# Patient Record
Sex: Male | Born: 1945 | Race: White | Hispanic: No | Smoking: Former smoker
Health system: Southern US, Community
[De-identification: ages and names within clinical notes are randomized; demographics above are authoritative.]

## PROBLEM LIST (undated history)

## (undated) DIAGNOSIS — E785 Hyperlipidemia, unspecified: Secondary | ICD-10-CM

## (undated) DIAGNOSIS — F419 Anxiety disorder, unspecified: Secondary | ICD-10-CM

## (undated) DIAGNOSIS — N400 Enlarged prostate without lower urinary tract symptoms: Secondary | ICD-10-CM

## (undated) DIAGNOSIS — M109 Gout, unspecified: Secondary | ICD-10-CM

## (undated) DIAGNOSIS — Z978 Presence of other specified devices: Secondary | ICD-10-CM

## (undated) DIAGNOSIS — I10 Essential (primary) hypertension: Secondary | ICD-10-CM

## (undated) DIAGNOSIS — G4733 Obstructive sleep apnea (adult) (pediatric): Secondary | ICD-10-CM

## (undated) DIAGNOSIS — IMO0001 Reserved for inherently not codable concepts without codable children: Secondary | ICD-10-CM

## (undated) DIAGNOSIS — Z91018 Allergy to other foods: Secondary | ICD-10-CM

## (undated) DIAGNOSIS — R600 Localized edema: Secondary | ICD-10-CM

## (undated) DIAGNOSIS — R609 Edema, unspecified: Secondary | ICD-10-CM

## (undated) DIAGNOSIS — Z960 Presence of urogenital implants: Secondary | ICD-10-CM

## (undated) DIAGNOSIS — R339 Retention of urine, unspecified: Secondary | ICD-10-CM

## (undated) DIAGNOSIS — M549 Dorsalgia, unspecified: Secondary | ICD-10-CM

## (undated) DIAGNOSIS — E119 Type 2 diabetes mellitus without complications: Secondary | ICD-10-CM

## (undated) DIAGNOSIS — M2550 Pain in unspecified joint: Secondary | ICD-10-CM

## (undated) DIAGNOSIS — L409 Psoriasis, unspecified: Secondary | ICD-10-CM

## (undated) HISTORY — DX: Dorsalgia, unspecified: M54.9

## (undated) HISTORY — DX: Essential (primary) hypertension: I10

## (undated) HISTORY — DX: Type 2 diabetes mellitus without complications: E11.9

## (undated) HISTORY — DX: Edema, unspecified: R60.9

## (undated) HISTORY — DX: Localized edema: R60.0

## (undated) HISTORY — DX: Anxiety disorder, unspecified: F41.9

## (undated) HISTORY — DX: Gout, unspecified: M10.9

## (undated) HISTORY — DX: Pain in unspecified joint: M25.50

## (undated) HISTORY — PX: BACK SURGERY: SHX140

## (undated) HISTORY — DX: Obstructive sleep apnea (adult) (pediatric): G47.33

## (undated) HISTORY — DX: Allergy to other foods: Z91.018

## (undated) HISTORY — DX: Hyperlipidemia, unspecified: E78.5

---

## 2009-02-01 ENCOUNTER — Encounter: Admission: RE | Admit: 2009-02-01 | Discharge: 2009-03-29 | Payer: Self-pay | Admitting: Family Medicine

## 2010-11-02 ENCOUNTER — Encounter (HOSPITAL_BASED_OUTPATIENT_CLINIC_OR_DEPARTMENT_OTHER): Payer: Self-pay

## 2010-11-07 ENCOUNTER — Ambulatory Visit (HOSPITAL_BASED_OUTPATIENT_CLINIC_OR_DEPARTMENT_OTHER): Payer: Medicare Other | Attending: Family Medicine

## 2010-11-07 DIAGNOSIS — G4761 Periodic limb movement disorder: Secondary | ICD-10-CM | POA: Insufficient documentation

## 2010-11-07 DIAGNOSIS — G4710 Hypersomnia, unspecified: Secondary | ICD-10-CM | POA: Insufficient documentation

## 2010-11-07 DIAGNOSIS — G4730 Sleep apnea, unspecified: Secondary | ICD-10-CM | POA: Insufficient documentation

## 2010-11-10 DIAGNOSIS — G4710 Hypersomnia, unspecified: Secondary | ICD-10-CM

## 2010-11-10 DIAGNOSIS — G4761 Periodic limb movement disorder: Secondary | ICD-10-CM

## 2010-11-10 DIAGNOSIS — G4730 Sleep apnea, unspecified: Secondary | ICD-10-CM

## 2010-11-10 NOTE — Procedures (Signed)
NAME:  Benjamin Shepard, Benjamin Shepard          ACCOUNT NO.:  [id number]  MEDICAL RECORD NO.:  [id number]          PATIENT TYPE:  OUT  LOCATION:  SLEEP CENTER                 FACILITY:  [facility]  PHYSICIAN:  Roniya Tetro D. Maple Hudson, MD, FCCP, FACPDATE OF BIRTH:  03/14/1944  DATE OF STUDY:  11/07/2010                           NOCTURNAL POLYSOMNOGRAM  REFERRING PHYSICIAN:  Lillia Carmel, M.D.  INDICATIONS FOR STUDY:  Hypersomnia with sleep apnea.  EPWORTH SLEEPINESS SCORE:  4/24.  BMI 43.5.  Weight 330 pounds, height 73 inches.  Neck 20.5 inches.  HOME MEDICATIONS:  Charted and reviewed.  On the ordering instructions, a CPAP titration study was ordered with indication that the patient did had a diagnostic sleep study.  The patient indicated on his own form that he had never had a sleep study. This study was performed as ordered, as a therapeutic titration study.  SLEEP ARCHITECTURE:  Total sleep time 221 minutes with sleep efficiency 54%.  Stage I was 10.9%, stage II 55.2%, stage III 0.2%, REM 33.7% of total sleep time.  Sleep latency 44.5 minutes, REM latency 140 minutes, awake after sleep onset 144 minutes, arousal index 16.  BEDTIME MEDICATION:  Doxazosin.  RESPIRATORY DATA:  CPAP titration protocol.  CPAP was titrated to 10 CWP, AHI 0 per hour.  He wore a large ResMed Quattro FX full-face mask with heated humidifier.  OXYGEN DATA:  Snoring was prevented by CPAP and mean oxygen saturation held 93.7% on room air.  CARDIAC DATA:  Normal sinus rhythm.  MOVEMENTS/PARASOMNIA:  Frequent limb jerks.  A total of 283 limb jerks were counted of which 24 were associated with arousal or awakening for periodic limb movement with arousal index of 6.5 per hour.  No bathroom trips.  IMPRESSION/RECOMMENDATIONS: 1. This study was performed as ordered as a diagnostic CPAP titration     study.  Preexisting baseline NPSG documenting obstructive sleep     apnea is not available. 2. Successful CPAP titration  to 10 CWP, AHI 0 per hour.  He wore a     large ResMed Quattro FX full-face mask with heated humidifier.     Snoring was prevented and oxygenation maintained in normal range of     93.7% on room air. 3. Periodic limb movement with arousal.  A total of 283 limb jerks     were counted of which 24 were associated with arousal or awakening     for periodic limb movement with arousal index of 6.5 per hour.  If     limb movement is recognized as an important contributor to sleep     disturbance clinically then specific therapy such as Requip or     Mirapex might be considered.     Blue Ruggerio D. Maple Hudson, MD, [facility], FACP Diplomate, Biomedical engineer of Sleep Medicine Electronically Signed    CDY/MEDQ  D:  11/10/2010 09:42:41  T:  11/10/2010 09:56:05  Job:  [id number]

## 2010-11-29 ENCOUNTER — Ambulatory Visit (HOSPITAL_BASED_OUTPATIENT_CLINIC_OR_DEPARTMENT_OTHER): Payer: Medicare Other | Attending: Family Medicine

## 2010-12-01 NOTE — Procedures (Signed)
NAME:  Benjamin Shepard, Benjamin Shepard          ACCOUNT NO.:  [id number]  MEDICAL RECORD NO.:  [id number]          PATIENT TYPE:  OUT  LOCATION:  SLEEP CENTER                 FACILITY:  [facility]  PHYSICIAN:  Clinton D. Maple Hudson, MD, FCCP, FACPDATE OF BIRTH:  Jun 28, 1945  DATE OF STUDY:  11/29/2010                           NOCTURNAL POLYSOMNOGRAM  REFERRING PHYSICIAN:  Lillia Carmel, M.D.  REFERRING PHYSICIAN:  Lillia Carmel, MD.  INDICATION FOR STUDY:  Hypersomnia with sleep apnea.  EPWORTH SLEEPINESS SCORE:  4/24.  BMI 43.5.  Weight 330 pounds.  Height 73 inches.  Neck 20.5 inches.  MEDICATIONS:  Home medications are charted and reviewed.  A diagnostic CPAP titration study on November 07, 2010 had documented good control at a CPAP pressure of 10 with AHI zero per hour.  Diagnostics NPSG is requested.  SLEEP ARCHITECTURE:  Total sleep time 260.5 minutes with sleep efficiency 60.2%.  Stage I was 23.4%, stage II 63.3%, stage III was absent.  REM 13.2% of total sleep time.  Sleep latency 64 minutes.  REM latency 333.5 minutes.  Awake after sleep onset 107.5 minutes.  Arousal index 55.3.  BEDTIME MEDICATION:  Doxazosin.  RESPIRATORY DATA:  Apnea/hypopnea index (AHI) 73.7 per hour.  A total of 320 events was scored including 43 obstructive apneas, 4 central apneas, 1 mixed apnea, 272 hypopneas.  All events were associated with non- supine sleep position and REM.  REM AHI 76.5 per hour.  Sleep was severely fragmented by respiratory events, preventing application of split protocol CPAP titration on the study night.  OXYGEN DATA:  Moderate-to-loud snoring with oxygen desaturation to a nadir of 79% and mean oxygen saturation through the study is 92.9% on room air.  CARDIAC DATA:  Sinus rhythm with PACs.  MOVEMENT-PARASOMNIA:  A few limb jerks were noted with insignificant effect on sleep.  No bathroom trips.  IMPRESSIONS-RECOMMENDATIONS: 1. Severe obstructive sleep apnea/hypopnea syndrome,  AHI 73.7 per hour     with non-supine events.  Moderate-to-loud snoring with oxygen     desaturation to a nadir of 79% and a mean oxygen saturation through     the study of 92.9% on room air. 2. Successful CPAP titration to 10 CWP, AHI zero per hour was noted on     November 07, 2010.  He had worn a large ResMed Quattro FX full-face     mask with heated humidifier.  Snoring was improved and oxygenation     normalized.     Clinton D. Maple Hudson, MD, [facility], FACP Diplomate, Biomedical engineer of Sleep Medicine Electronically Signed    CDY/MEDQ  D:  12/01/2010 10:56:05  T:  12/01/2010 11:08:37  Job:  [id number]

## 2011-06-05 DIAGNOSIS — I059 Rheumatic mitral valve disease, unspecified: Secondary | ICD-10-CM | POA: Diagnosis not present

## 2011-06-05 DIAGNOSIS — I77819 Aortic ectasia, unspecified site: Secondary | ICD-10-CM | POA: Diagnosis not present

## 2011-07-31 DIAGNOSIS — I119 Hypertensive heart disease without heart failure: Secondary | ICD-10-CM | POA: Diagnosis not present

## 2011-07-31 DIAGNOSIS — G4733 Obstructive sleep apnea (adult) (pediatric): Secondary | ICD-10-CM | POA: Diagnosis not present

## 2011-07-31 DIAGNOSIS — R609 Edema, unspecified: Secondary | ICD-10-CM | POA: Diagnosis not present

## 2011-10-15 DIAGNOSIS — Z0000 Encounter for general adult medical examination without abnormal findings: Secondary | ICD-10-CM | POA: Diagnosis not present

## 2011-10-15 DIAGNOSIS — E119 Type 2 diabetes mellitus without complications: Secondary | ICD-10-CM | POA: Diagnosis not present

## 2011-10-15 DIAGNOSIS — H1030 Unspecified acute conjunctivitis, unspecified eye: Secondary | ICD-10-CM | POA: Diagnosis not present

## 2011-10-15 DIAGNOSIS — H60509 Unspecified acute noninfective otitis externa, unspecified ear: Secondary | ICD-10-CM | POA: Diagnosis not present

## 2011-10-15 DIAGNOSIS — E079 Disorder of thyroid, unspecified: Secondary | ICD-10-CM | POA: Diagnosis not present

## 2012-03-11 DIAGNOSIS — Z23 Encounter for immunization: Secondary | ICD-10-CM | POA: Diagnosis not present

## 2012-08-11 DIAGNOSIS — H00029 Hordeolum internum unspecified eye, unspecified eyelid: Secondary | ICD-10-CM | POA: Diagnosis not present

## 2012-08-11 DIAGNOSIS — E119 Type 2 diabetes mellitus without complications: Secondary | ICD-10-CM | POA: Diagnosis not present

## 2012-08-31 DIAGNOSIS — G4733 Obstructive sleep apnea (adult) (pediatric): Secondary | ICD-10-CM | POA: Diagnosis not present

## 2012-08-31 DIAGNOSIS — R609 Edema, unspecified: Secondary | ICD-10-CM | POA: Diagnosis not present

## 2012-08-31 DIAGNOSIS — I119 Hypertensive heart disease without heart failure: Secondary | ICD-10-CM | POA: Diagnosis not present

## 2012-10-12 DIAGNOSIS — I119 Hypertensive heart disease without heart failure: Secondary | ICD-10-CM | POA: Diagnosis not present

## 2012-10-12 DIAGNOSIS — E119 Type 2 diabetes mellitus without complications: Secondary | ICD-10-CM | POA: Diagnosis not present

## 2012-10-12 DIAGNOSIS — G4733 Obstructive sleep apnea (adult) (pediatric): Secondary | ICD-10-CM | POA: Diagnosis not present

## 2013-02-04 DIAGNOSIS — I10 Essential (primary) hypertension: Secondary | ICD-10-CM | POA: Diagnosis not present

## 2013-02-04 DIAGNOSIS — H269 Unspecified cataract: Secondary | ICD-10-CM | POA: Diagnosis not present

## 2013-02-04 DIAGNOSIS — Z6841 Body Mass Index (BMI) 40.0 and over, adult: Secondary | ICD-10-CM | POA: Diagnosis not present

## 2013-02-04 DIAGNOSIS — E119 Type 2 diabetes mellitus without complications: Secondary | ICD-10-CM | POA: Diagnosis not present

## 2013-02-04 DIAGNOSIS — Z23 Encounter for immunization: Secondary | ICD-10-CM | POA: Diagnosis not present

## 2013-02-04 DIAGNOSIS — G4733 Obstructive sleep apnea (adult) (pediatric): Secondary | ICD-10-CM | POA: Diagnosis not present

## 2013-02-04 DIAGNOSIS — M109 Gout, unspecified: Secondary | ICD-10-CM | POA: Diagnosis not present

## 2013-02-04 DIAGNOSIS — Z1331 Encounter for screening for depression: Secondary | ICD-10-CM | POA: Diagnosis not present

## 2013-02-09 DIAGNOSIS — H2510 Age-related nuclear cataract, unspecified eye: Secondary | ICD-10-CM | POA: Diagnosis not present

## 2013-02-09 DIAGNOSIS — H18419 Arcus senilis, unspecified eye: Secondary | ICD-10-CM | POA: Diagnosis not present

## 2013-02-09 DIAGNOSIS — H04129 Dry eye syndrome of unspecified lacrimal gland: Secondary | ICD-10-CM | POA: Diagnosis not present

## 2013-02-09 DIAGNOSIS — H35379 Puckering of macula, unspecified eye: Secondary | ICD-10-CM | POA: Diagnosis not present

## 2013-03-13 ENCOUNTER — Encounter: Payer: Self-pay | Admitting: *Deleted

## 2013-03-13 ENCOUNTER — Encounter: Payer: Self-pay | Admitting: Interventional Cardiology

## 2013-03-13 DIAGNOSIS — E119 Type 2 diabetes mellitus without complications: Secondary | ICD-10-CM | POA: Insufficient documentation

## 2013-03-13 DIAGNOSIS — G4733 Obstructive sleep apnea (adult) (pediatric): Secondary | ICD-10-CM | POA: Insufficient documentation

## 2013-03-13 DIAGNOSIS — M109 Gout, unspecified: Secondary | ICD-10-CM | POA: Insufficient documentation

## 2013-03-13 DIAGNOSIS — R609 Edema, unspecified: Secondary | ICD-10-CM | POA: Insufficient documentation

## 2013-03-13 DIAGNOSIS — I10 Essential (primary) hypertension: Secondary | ICD-10-CM | POA: Insufficient documentation

## 2013-03-15 ENCOUNTER — Ambulatory Visit (INDEPENDENT_AMBULATORY_CARE_PROVIDER_SITE_OTHER): Payer: Medicare Other | Admitting: Interventional Cardiology

## 2013-03-15 ENCOUNTER — Encounter: Payer: Self-pay | Admitting: Interventional Cardiology

## 2013-03-15 VITALS — BP 180/70 | HR 84 | Ht 73.0 in | Wt 330.0 lb

## 2013-03-15 DIAGNOSIS — G4733 Obstructive sleep apnea (adult) (pediatric): Secondary | ICD-10-CM

## 2013-03-15 DIAGNOSIS — E119 Type 2 diabetes mellitus without complications: Secondary | ICD-10-CM

## 2013-03-15 DIAGNOSIS — I10 Essential (primary) hypertension: Secondary | ICD-10-CM

## 2013-03-15 MED ORDER — SPIRONOLACTONE 25 MG PO TABS: 25.0000 mg | ORAL_TABLET | Freq: Every day | ORAL | Status: DC

## 2013-03-15 NOTE — Patient Instructions (Signed)
Start Spironolactone 25mg  daily. An Rx has been sent to your pharmacy  Continue taking all other medications as prescribed.  Your physician recommends that you return for lab work in: 7-10 days  Your physician recommends that you schedule a follow-up appointment in: 2-3 months

## 2013-03-15 NOTE — Progress Notes (Signed)
Patient ID: Benjamin Shepard, male   DOB: 04/11/1945, 67 y.o.   MRN: [id number]    [address], [address] [place], [place]  [address] Phone: [phone] Fax:  [phone]  Date:  03/15/2013   ID:  Benjamin Shepard, DOB [birth year], MRN [id number]  PCP:  No primary provider on file.   ASSESSMENT: Hypertension followup  1. Poor control of hypertension based upon today's data 2. Obesity 3. Sleep apnea  PLAN:  1. Add Aldactone 25 mg daily 2. Bmet 7-10 days   SUBJECTIVE: Benjamin Shepard is a 67 y.o. male who feels well. He is back today for followup after identifying poorly controlled blood pressure several months ago. Medication adjustments were made at that time. He denies dyspnea, orthopnea, syncope, and other complaints.   Wt Readings from Last 3 Encounters:  03/15/13 330 lb (149.687 kg)     Past Medical History  Diagnosis Date  . HTN (hypertension)   . Edema   . OSA (obstructive sleep apnea)   . Diabetes   . Gout     Current Outpatient Prescriptions  Medication Sig Dispense Refill  . allopurinol (ZYLOPRIM) 100 MG tablet Take 100 mg by mouth daily.      . colchicine 0.6 MG tablet Take 0.6 mg by mouth as needed.      . diltiazem (TIAZAC) 360 MG 24 hr capsule Take 360 mg by mouth daily.      Marland Kitchen doxazosin (CARDURA) 8 MG tablet Take 8 mg by mouth daily.      Marland Kitchen losartan-hydrochlorothiazide (HYZAAR) 100-12.5 MG per tablet Take 1 tablet by mouth daily.      . metFORMIN (GLUCOPHAGE) 500 MG tablet Take 500 mg by mouth 2 (two) times daily with a meal.       No current facility-administered medications for this visit.    Allergies:    Allergies  Allergen Reactions  . Other     Muscle relaxer    Social History:  The patient     ROS:  Please see the history of present illness.   All other systems reviewed and negative.   OBJECTIVE: VS:  BP 180/70  Pulse 84  Ht 6\' 1"  (1.854 m)  Wt 330 lb (149.687 kg)  BMI 43.55 kg/m2 Well nourished, well developed, in no  acute distress, healthy but obese HEENT: normal Neck: JVD flat. Carotid bruit absent  Cardiac:  normal S1, S2; RRR; no murmur Lungs:  clear to auscultation bilaterally, no wheezing, rhonchi or rales Abd: soft, nontender, no hepatomegaly Ext: Edema absent. Pulses 2+ Skin: warm and dry Neuro:  CNs 2-12 intact, no focal abnormalities noted  EKG:  Normal sinus rhythm, right bundle branch block, old inferior infarct.       Signed, Darci Needle III, MD 03/15/2013 11:37 AM  Past Medical History  Hypertension   Diabetes mellitus   Gout   Obesity

## 2013-03-17 ENCOUNTER — Telehealth: Payer: Self-pay | Admitting: Interventional Cardiology

## 2013-03-17 NOTE — Telephone Encounter (Signed)
New problem    Pt has question concerning Spironolactone that was prescribe to him. Please call.

## 2013-03-17 NOTE — Telephone Encounter (Signed)
returned pt call.adv pt that he is scheduled to have bmet lab drawn and his potassium would be checked with that lab.Dr.Smith would determine at that time if pt needs add K+ supplement. pt verbalized understanding.

## 2013-03-22 ENCOUNTER — Other Ambulatory Visit (INDEPENDENT_AMBULATORY_CARE_PROVIDER_SITE_OTHER): Payer: Medicare Other

## 2013-03-22 DIAGNOSIS — I10 Essential (primary) hypertension: Secondary | ICD-10-CM | POA: Diagnosis not present

## 2013-03-22 LAB — BASIC METABOLIC PANEL
CO2: 29 mEq/L (ref 19–32)
Chloride: 101 mEq/L (ref 96–112)
Glucose, Bld: 130 mg/dL — ABNORMAL HIGH (ref 70–99)
Potassium: 3.9 mEq/L (ref 3.5–5.1)
Sodium: 137 mEq/L (ref 135–145)

## 2013-03-23 ENCOUNTER — Telehealth: Payer: Self-pay

## 2013-03-23 NOTE — Telephone Encounter (Signed)
pt given lab results.Laboratory tests are normal with the exception of blood sugar 130.pt verbalized understanding.pt rquest a copy be forwarded to his pcp Dr.Holwerda.

## 2013-03-23 NOTE — Telephone Encounter (Signed)
Message copied by Jarvis Newcomer on Tue Mar 23, 2013  1:41 PM ------      Message from: Verdis Prime      Created: Mon Mar 22, 2013  6:01 PM       Laboratory tests are normal with the exception of blood sugar 1:30 ------

## 2013-04-29 ENCOUNTER — Other Ambulatory Visit: Payer: Self-pay | Admitting: Interventional Cardiology

## 2013-05-07 DIAGNOSIS — M109 Gout, unspecified: Secondary | ICD-10-CM | POA: Diagnosis not present

## 2013-05-07 DIAGNOSIS — E119 Type 2 diabetes mellitus without complications: Secondary | ICD-10-CM | POA: Diagnosis not present

## 2013-05-07 DIAGNOSIS — Z125 Encounter for screening for malignant neoplasm of prostate: Secondary | ICD-10-CM | POA: Diagnosis not present

## 2013-05-07 DIAGNOSIS — I10 Essential (primary) hypertension: Secondary | ICD-10-CM | POA: Diagnosis not present

## 2013-05-26 ENCOUNTER — Encounter: Payer: Self-pay | Admitting: Internal Medicine

## 2013-05-26 DIAGNOSIS — E1169 Type 2 diabetes mellitus with other specified complication: Secondary | ICD-10-CM | POA: Diagnosis not present

## 2013-05-26 DIAGNOSIS — G4733 Obstructive sleep apnea (adult) (pediatric): Secondary | ICD-10-CM | POA: Diagnosis not present

## 2013-05-26 DIAGNOSIS — I10 Essential (primary) hypertension: Secondary | ICD-10-CM | POA: Diagnosis not present

## 2013-05-26 DIAGNOSIS — Z0000 Encounter for general adult medical examination without abnormal findings: Secondary | ICD-10-CM | POA: Diagnosis not present

## 2013-05-26 DIAGNOSIS — M109 Gout, unspecified: Secondary | ICD-10-CM | POA: Diagnosis not present

## 2013-05-26 DIAGNOSIS — E785 Hyperlipidemia, unspecified: Secondary | ICD-10-CM | POA: Insufficient documentation

## 2013-05-26 DIAGNOSIS — Z6841 Body Mass Index (BMI) 40.0 and over, adult: Secondary | ICD-10-CM | POA: Diagnosis not present

## 2013-06-08 ENCOUNTER — Encounter: Payer: Self-pay | Admitting: Internal Medicine

## 2013-06-17 ENCOUNTER — Other Ambulatory Visit: Payer: Self-pay | Admitting: Interventional Cardiology

## 2013-06-22 ENCOUNTER — Ambulatory Visit (INDEPENDENT_AMBULATORY_CARE_PROVIDER_SITE_OTHER): Payer: Medicare Other | Admitting: Interventional Cardiology

## 2013-06-22 ENCOUNTER — Encounter: Payer: Self-pay | Admitting: Interventional Cardiology

## 2013-06-22 VITALS — BP 154/85 | HR 83 | Ht 73.0 in | Wt 323.0 lb

## 2013-06-22 DIAGNOSIS — G4733 Obstructive sleep apnea (adult) (pediatric): Secondary | ICD-10-CM | POA: Diagnosis not present

## 2013-06-22 DIAGNOSIS — I10 Essential (primary) hypertension: Secondary | ICD-10-CM

## 2013-06-22 DIAGNOSIS — E119 Type 2 diabetes mellitus without complications: Secondary | ICD-10-CM | POA: Diagnosis not present

## 2013-06-22 DIAGNOSIS — R609 Edema, unspecified: Secondary | ICD-10-CM

## 2013-06-22 NOTE — Patient Instructions (Signed)
Your physician has recommended you make the following change in your medication:  1) Start Aspirin 81mg  daily  Take all other medications as prescribed  Your physician discussed the importance of regular exercise and recommended that you start or continue a regular exercise program for good health.  Monitor your sodium intake  Your physician wants you to follow-up in: 1 year You will receive a reminder letter in the mail two months in advance. If you don't receive a letter, please call our office to schedule the follow-up appointment.

## 2013-06-22 NOTE — Progress Notes (Signed)
Patient ID: Benjamin Shepard, male   DOB: 1946/01/13, 68 y.o.   MRN: [id number]    [address], [address] [place], [place]  [address] Phone: [phone] Fax:  [phone]  Date:  06/22/2013   ID:  Benjamin Shepard, DOB 1945/04/08, MRN [id number]  PCP:  No primary provider on file.   ASSESSMENT:  1. Hypertension, still not optimally controlled 2. Obesity 3. Sleep apnea 4. Diabetes mellitus  PLAN:  1. Continue current medical regimen 2. If blood pressure continues to remain high, low-dose beta blocker therapy should be added 3. I encouraged aerobic activity and weight loss    SUBJECTIVE: Benjamin Shepard is a 68 y.o. male who has no complaints. There no medication side effects. He denies lightheadedness and syncope. No chest pain. He has lost 7 pounds since December 15 visit. Lower extremity edema has resolved Aldactone has been increased to 50 mg daily instead of 25.   Wt Readings from Last 3 Encounters:  06/22/13 323 lb (146.512 kg)  03/15/13 330 lb (149.687 kg)     Past Medical History  Diagnosis Date  . HTN (hypertension)   . Edema   . OSA (obstructive sleep apnea)   . Diabetes   . Gout     Current Outpatient Prescriptions  Medication Sig Dispense Refill  . allopurinol (ZYLOPRIM) 100 MG tablet Take 100 mg by mouth daily.      Marland Kitchen aspirin 81 MG tablet Take 81 mg by mouth daily.      Marland Kitchen atorvastatin (LIPITOR) 20 MG tablet Take 20 mg by mouth daily.      . colchicine 0.6 MG tablet Take 0.6 mg by mouth as needed.      . doxazosin (CARDURA) 8 MG tablet Take 8 mg by mouth daily.      Marland Kitchen losartan-hydrochlorothiazide (HYZAAR) 100-12.5 MG per tablet Take 1 tablet by mouth daily.      . metFORMIN (GLUCOPHAGE) 500 MG tablet Take 500 mg by mouth 2 (two) times daily with a meal.      . spironolactone (ALDACTONE) 25 MG tablet Take 1 tablet (25 mg total) by mouth daily.  30 tablet  11  . TAZTIA XT 360 MG 24 hr capsule TAKE ONE CAPSULE BY MOUTH DAILY  30 capsule  0   No  current facility-administered medications for this visit.    Allergies:    Allergies  Allergen Reactions  . Other     Muscle relaxer    Social History:  The patient  reports that he quit smoking about 15 years ago. His smoking use included Cigarettes. He smoked 0.00 packs per day. He does not have any smokeless tobacco history on file.   ROS:  Please see the history of present illness.   No blood in urine or stool. Not currently taking aspirin. No transient neurological complaints.   All other systems reviewed and negative.   OBJECTIVE: VS:  BP 154/85  Pulse 83  Ht 6\' 1"  (1.854 m)  Wt 323 lb (146.512 kg)  BMI 42.62 kg/m2 Well nourished, well developed, in no acute distress, obese HEENT: normal Neck: JVD flat. Carotid bruit absent  Cardiac:  normal S1, S2; RRR; no murmur Lungs:  clear to auscultation bilaterally, no wheezing, rhonchi or rales Abd: soft, nontender, no hepatomegaly Ext: Edema trace bilateral. Pulses absent Skin: warm and dry Neuro:  CNs 2-12 intact, no focal abnormalities noted  EKG:  Not repeated       Signed, Illene Labrador III, MD 06/22/2013  11:29 AM

## 2013-07-07 ENCOUNTER — Ambulatory Visit (AMBULATORY_SURGERY_CENTER): Payer: Self-pay | Admitting: *Deleted

## 2013-07-07 VITALS — Ht 72.0 in | Wt 324.6 lb

## 2013-07-07 DIAGNOSIS — Z1211 Encounter for screening for malignant neoplasm of colon: Secondary | ICD-10-CM

## 2013-07-07 MED ORDER — MOVIPREP 100 G PO SOLR: ORAL | Status: DC

## 2013-07-07 NOTE — Progress Notes (Signed)
No allergies to eggs or soy. No problems with anesthesia.  Pt given Emmi instructions for colonoscopy  

## 2013-07-13 ENCOUNTER — Other Ambulatory Visit: Payer: Self-pay | Admitting: Interventional Cardiology

## 2013-07-21 ENCOUNTER — Encounter: Payer: Medicare Other | Admitting: Internal Medicine

## 2013-08-10 ENCOUNTER — Encounter: Payer: Self-pay | Admitting: Internal Medicine

## 2013-08-10 ENCOUNTER — Ambulatory Visit (AMBULATORY_SURGERY_CENTER): Payer: Medicare Other | Admitting: Internal Medicine

## 2013-08-10 VITALS — BP 119/72 | HR 63 | Temp 98.5°F | Resp 22 | Ht 72.0 in | Wt 324.0 lb

## 2013-08-10 DIAGNOSIS — Z1211 Encounter for screening for malignant neoplasm of colon: Secondary | ICD-10-CM | POA: Diagnosis not present

## 2013-08-10 DIAGNOSIS — D126 Benign neoplasm of colon, unspecified: Secondary | ICD-10-CM

## 2013-08-10 DIAGNOSIS — G4733 Obstructive sleep apnea (adult) (pediatric): Secondary | ICD-10-CM | POA: Diagnosis not present

## 2013-08-10 DIAGNOSIS — M109 Gout, unspecified: Secondary | ICD-10-CM | POA: Diagnosis not present

## 2013-08-10 DIAGNOSIS — I10 Essential (primary) hypertension: Secondary | ICD-10-CM | POA: Diagnosis not present

## 2013-08-10 DIAGNOSIS — E119 Type 2 diabetes mellitus without complications: Secondary | ICD-10-CM | POA: Diagnosis not present

## 2013-08-10 MED ORDER — SODIUM CHLORIDE 0.9 % IV SOLN: 500.0000 mL | INTRAVENOUS | Status: DC

## 2013-08-10 NOTE — Progress Notes (Signed)
No complaints noted in the reocery room.  pER dR. pYRTLE HOLD ASA 81MG  TODAY, BUT RESUME TOMORROW.  i WENT OVER THIS WITH THE PT AND HIS WIFE. MAW

## 2013-08-10 NOTE — Patient Instructions (Signed)
YOU HAD AN ENDOSCOPIC PROCEDURE TODAY AT THE [facility]: Refer to the procedure report that was given to you for any specific questions about what was found during the examination.  If the procedure report does not answer your questions, please call your gastroenterologist to clarify.  If you requested that your care partner not be given the details of your procedure findings, then the procedure report has been included in a sealed envelope for you to review at your convenience later.  YOU SHOULD EXPECT: Some feelings of bloating in the abdomen. Passage of more gas than usual.  Walking can help get rid of the air that was put into your GI tract during the procedure and reduce the bloating. If you had a lower endoscopy (such as a colonoscopy or flexible sigmoidoscopy) you may notice spotting of blood in your stool or on the toilet paper. If you underwent a bowel prep for your procedure, then you may not have a normal bowel movement for a few days.  DIET: Your first meal following the procedure should be a light meal and then it is ok to progress to your normal diet.  A half-sandwich or bowl of soup is an example of a good first meal.  Heavy or fried foods are harder to digest and may make you feel nauseous or bloated.  Likewise meals heavy in dairy and vegetables can cause extra gas to form and this can also increase the bloating.  Drink plenty of fluids but you should avoid alcoholic beverages for 24 hours.  ACTIVITY: Your care partner should take you home directly after the procedure.  You should plan to take it easy, moving slowly for the rest of the day.  You can resume normal activity the day after the procedure however you should NOT DRIVE or use heavy machinery for 24 hours (because of the sedation medicines used during the test).    SYMPTOMS TO REPORT IMMEDIATELY: A gastroenterologist can be reached at any hour.  During normal business hours, 8:30 AM to 5:00 PM Monday through Friday,  call [phone].  After hours and on weekends, please call the GI answering service at [phone] who will take a message and have the physician on call contact you.   Following lower endoscopy (colonoscopy or flexible sigmoidoscopy):  Excessive amounts of blood in the stool  Significant tenderness or worsening of abdominal pains  Swelling of the abdomen that is new, acute  Fever of 100F or higher   FOLLOW UP: If any biopsies were taken you will be contacted by phone or by letter within the next 1-3 weeks.  Call your gastroenterologist if you have not heard about the biopsies in 3 weeks.  Our staff will call the home number listed on your records the next business day following your procedure to check on you and address any questions or concerns that you may have at that time regarding the information given to you following your procedure. This is a courtesy call and so if there is no answer at the home number and we have not heard from you through the emergency physician on call, we will assume that you have returned to your regular daily activities without incident.  SIGNATURES/CONFIDENTIALITY: You and/or your care partner have signed paperwork which will be entered into your electronic medical record.  These signatures attest to the fact that that the information above on your After Visit Summary has been reviewed and is understood.  Full responsibility of the confidentiality of   this discharge information lies with you and/or your care-partner.     Handouts were given to your care partner on polyps, diverticulosis and a high fiber diet with liberal fluid intake. You may resume your current medications today. Except hold NSAIDS for the next two weeks. Await biopsy results. Please call if any questions or concerns. Blood sugar was 141 in the recovery room.

## 2013-08-10 NOTE — Progress Notes (Signed)
Report to pacu rn, vss, bbs=clear 

## 2013-08-10 NOTE — Progress Notes (Signed)
Called to room to assist during endoscopic procedure.  Patient ID and intended procedure confirmed with present staff. Received instructions for my participation in the procedure from the performing physician.  

## 2013-08-10 NOTE — Op Note (Signed)
[place]  [address] [place], [address]   COLONOSCOPY PROCEDURE REPORT  PATIENT: Benjamin Shepard, Benjamin Shepard  MR#: [id number] BIRTHDATE: [birth year] , 67  yrs. old GENDER: Male ENDOSCOPIST: Jerene Bears, MD REFERRED TM:LYYTK Ardeth Perfect, M.D. PROCEDURE DATE:  08/10/2013 PROCEDURE:   Colonoscopy with snare polypectomy and Colonoscopy with cold biopsy polypectomy First Screening Colonoscopy - Avg.  risk and is 50 yrs.  old or older Yes.  Prior Negative Screening - Now for repeat screening. N/A  History of Adenoma - Now for follow-up colonoscopy & has been > or = to 3 yrs.  N/A  Polyps Removed Today? Yes. ASA CLASS:   Class III INDICATIONS:average risk screening and first colonoscopy. MEDICATIONS: MAC sedation, administered by CRNA and propofol (Diprivan) 300mg  IV  DESCRIPTION OF PROCEDURE:   After the risks benefits and alternatives of the procedure were thoroughly explained, informed consent was obtained.  A digital rectal exam revealed no rectal mass.   The LB [id number]  endoscope was introduced through the anus and advanced to the cecum, which was identified by both the appendix and ileocecal valve. No adverse events experienced. The quality of the prep was good, using MoviPrep  The instrument was then slowly withdrawn as the colon was fully examined.   COLON FINDINGS: Eight sessile polyps ranging between 3-31mm in size were found at the hepatic flexure (1), in the transverse colon (2), descending colon (3), and sigmoid colon (2).  Polypectomy was performed with cold forceps (5) and using cold snare (3).  All resections were complete and all polyp tissue was completely retrieved.   There was moderate diverticulosis noted in the descending colon and sigmoid colon with associated muscular hypertrophy.  Retroflexed views revealed internal/external hemorrhoids. The time to cecum=4 minutes 54 seconds.  Withdrawal time=20 minutes 54 seconds.  The scope was  withdrawn and the procedure completed.  COMPLICATIONS: There were no complications.    ENDOSCOPIC IMPRESSION: 1.   Eight sessile polyps ranging between 3-32mm in size were found at the hepatic flexure, in the transverse colon, descending colon, and sigmoid colon; Polypectomy was performed with cold forceps and using cold snare 2.   There was moderate diverticulosis noted in the descending colon and sigmoid colon  RECOMMENDATIONS: 1.  Avoid all NSAIDS for the next 2 weeks. 2.  Await pathology results 3.  High fiber diet 4.  Timing of repeat colonoscopy will be determined by pathology findings. 5.  You will receive a letter within 1-2 weeks with the results of your biopsy as well as final recommendations.  Please call my office if you have not received a letter after 3 weeks.   eSigned:  Jerene Bears, MD 08/10/2013 10:00 AM   cc: The Patient, Velna Hatchet, M.D   PATIENT NAME:  [name], [name] MR#: [id number]

## 2013-08-11 ENCOUNTER — Telehealth: Payer: Self-pay

## 2013-08-11 NOTE — Telephone Encounter (Signed)
  Follow up Call-  Call back number 08/10/2013  Post procedure Call Back phone  # [phone]  Permission to leave phone message Yes     Patient questions:  Do you have a fever, pain , or abdominal swelling? no Pain Score  0 *  Have you tolerated food without any problems? yes  Have you been able to return to your normal activities? yes  Do you have any questions about your discharge instructions: Diet   no Medications  no Follow up visit  no  Do you have questions or concerns about your Care? no  Actions: * If pain score is 4 or above: No action needed, pain <4.

## 2013-08-18 ENCOUNTER — Encounter: Payer: Self-pay | Admitting: Internal Medicine

## 2013-09-02 DIAGNOSIS — E785 Hyperlipidemia, unspecified: Secondary | ICD-10-CM | POA: Diagnosis not present

## 2013-09-02 DIAGNOSIS — I10 Essential (primary) hypertension: Secondary | ICD-10-CM | POA: Diagnosis not present

## 2013-09-02 DIAGNOSIS — E119 Type 2 diabetes mellitus without complications: Secondary | ICD-10-CM | POA: Diagnosis not present

## 2013-09-17 ENCOUNTER — Other Ambulatory Visit: Payer: Self-pay | Admitting: Interventional Cardiology

## 2013-11-03 DIAGNOSIS — E119 Type 2 diabetes mellitus without complications: Secondary | ICD-10-CM | POA: Diagnosis not present

## 2013-11-10 ENCOUNTER — Other Ambulatory Visit: Payer: Self-pay | Admitting: Interventional Cardiology

## 2013-12-08 DIAGNOSIS — I10 Essential (primary) hypertension: Secondary | ICD-10-CM | POA: Diagnosis not present

## 2013-12-08 DIAGNOSIS — Z6841 Body Mass Index (BMI) 40.0 and over, adult: Secondary | ICD-10-CM | POA: Diagnosis not present

## 2013-12-08 DIAGNOSIS — G4733 Obstructive sleep apnea (adult) (pediatric): Secondary | ICD-10-CM | POA: Diagnosis not present

## 2013-12-08 DIAGNOSIS — R5381 Other malaise: Secondary | ICD-10-CM | POA: Diagnosis not present

## 2013-12-08 DIAGNOSIS — E1169 Type 2 diabetes mellitus with other specified complication: Secondary | ICD-10-CM | POA: Diagnosis not present

## 2013-12-08 DIAGNOSIS — E291 Testicular hypofunction: Secondary | ICD-10-CM | POA: Diagnosis not present

## 2013-12-08 DIAGNOSIS — R5383 Other fatigue: Secondary | ICD-10-CM | POA: Diagnosis not present

## 2013-12-08 DIAGNOSIS — Z23 Encounter for immunization: Secondary | ICD-10-CM | POA: Diagnosis not present

## 2013-12-08 DIAGNOSIS — E785 Hyperlipidemia, unspecified: Secondary | ICD-10-CM | POA: Diagnosis not present

## 2013-12-09 ENCOUNTER — Other Ambulatory Visit: Payer: Self-pay | Admitting: Interventional Cardiology

## 2013-12-09 NOTE — Telephone Encounter (Signed)
Should this be one or two daily? Please advise. Thanks, MI

## 2014-02-08 DIAGNOSIS — Z23 Encounter for immunization: Secondary | ICD-10-CM | POA: Diagnosis not present

## 2014-03-08 DIAGNOSIS — R600 Localized edema: Secondary | ICD-10-CM | POA: Diagnosis not present

## 2014-03-08 DIAGNOSIS — Z79899 Other long term (current) drug therapy: Secondary | ICD-10-CM | POA: Diagnosis not present

## 2014-03-08 DIAGNOSIS — E785 Hyperlipidemia, unspecified: Secondary | ICD-10-CM | POA: Diagnosis not present

## 2014-03-08 DIAGNOSIS — E119 Type 2 diabetes mellitus without complications: Secondary | ICD-10-CM | POA: Diagnosis not present

## 2014-03-08 DIAGNOSIS — R0609 Other forms of dyspnea: Secondary | ICD-10-CM | POA: Diagnosis not present

## 2014-03-08 DIAGNOSIS — G4733 Obstructive sleep apnea (adult) (pediatric): Secondary | ICD-10-CM | POA: Diagnosis not present

## 2014-03-08 DIAGNOSIS — I10 Essential (primary) hypertension: Secondary | ICD-10-CM | POA: Diagnosis not present

## 2014-03-08 DIAGNOSIS — Z6841 Body Mass Index (BMI) 40.0 and over, adult: Secondary | ICD-10-CM | POA: Diagnosis not present

## 2014-03-10 DIAGNOSIS — E119 Type 2 diabetes mellitus without complications: Secondary | ICD-10-CM | POA: Diagnosis not present

## 2014-03-10 DIAGNOSIS — R0609 Other forms of dyspnea: Secondary | ICD-10-CM | POA: Diagnosis not present

## 2014-03-10 DIAGNOSIS — E785 Hyperlipidemia, unspecified: Secondary | ICD-10-CM | POA: Diagnosis not present

## 2014-03-10 DIAGNOSIS — R600 Localized edema: Secondary | ICD-10-CM | POA: Diagnosis not present

## 2014-03-10 DIAGNOSIS — I10 Essential (primary) hypertension: Secondary | ICD-10-CM | POA: Diagnosis not present

## 2014-06-01 DIAGNOSIS — Z125 Encounter for screening for malignant neoplasm of prostate: Secondary | ICD-10-CM | POA: Diagnosis not present

## 2014-06-01 DIAGNOSIS — M109 Gout, unspecified: Secondary | ICD-10-CM | POA: Diagnosis not present

## 2014-06-01 DIAGNOSIS — E119 Type 2 diabetes mellitus without complications: Secondary | ICD-10-CM | POA: Diagnosis not present

## 2014-06-01 DIAGNOSIS — E785 Hyperlipidemia, unspecified: Secondary | ICD-10-CM | POA: Diagnosis not present

## 2014-06-01 DIAGNOSIS — I10 Essential (primary) hypertension: Secondary | ICD-10-CM | POA: Diagnosis not present

## 2014-06-08 ENCOUNTER — Encounter: Payer: Self-pay | Admitting: Interventional Cardiology

## 2014-06-08 DIAGNOSIS — H269 Unspecified cataract: Secondary | ICD-10-CM | POA: Diagnosis not present

## 2014-06-08 DIAGNOSIS — G4733 Obstructive sleep apnea (adult) (pediatric): Secondary | ICD-10-CM | POA: Diagnosis not present

## 2014-06-08 DIAGNOSIS — E119 Type 2 diabetes mellitus without complications: Secondary | ICD-10-CM | POA: Diagnosis not present

## 2014-06-08 DIAGNOSIS — M109 Gout, unspecified: Secondary | ICD-10-CM | POA: Diagnosis not present

## 2014-06-08 DIAGNOSIS — Z6841 Body Mass Index (BMI) 40.0 and over, adult: Secondary | ICD-10-CM | POA: Diagnosis not present

## 2014-06-08 DIAGNOSIS — E785 Hyperlipidemia, unspecified: Secondary | ICD-10-CM | POA: Diagnosis not present

## 2014-06-08 DIAGNOSIS — R600 Localized edema: Secondary | ICD-10-CM | POA: Diagnosis not present

## 2014-06-08 DIAGNOSIS — I10 Essential (primary) hypertension: Secondary | ICD-10-CM | POA: Diagnosis not present

## 2014-06-08 DIAGNOSIS — Z0000 Encounter for general adult medical examination without abnormal findings: Secondary | ICD-10-CM | POA: Diagnosis not present

## 2014-06-08 DIAGNOSIS — Z125 Encounter for screening for malignant neoplasm of prostate: Secondary | ICD-10-CM | POA: Diagnosis not present

## 2014-06-08 DIAGNOSIS — Z1389 Encounter for screening for other disorder: Secondary | ICD-10-CM | POA: Diagnosis not present

## 2014-06-09 DIAGNOSIS — Z1212 Encounter for screening for malignant neoplasm of rectum: Secondary | ICD-10-CM | POA: Diagnosis not present

## 2014-07-01 ENCOUNTER — Other Ambulatory Visit: Payer: Self-pay | Admitting: Interventional Cardiology

## 2014-07-19 ENCOUNTER — Encounter: Payer: Self-pay | Admitting: Interventional Cardiology

## 2014-07-19 ENCOUNTER — Ambulatory Visit (INDEPENDENT_AMBULATORY_CARE_PROVIDER_SITE_OTHER): Payer: Medicare Other | Admitting: Interventional Cardiology

## 2014-07-19 VITALS — BP 140/60 | HR 73 | Ht 73.0 in | Wt 343.8 lb

## 2014-07-19 DIAGNOSIS — R609 Edema, unspecified: Secondary | ICD-10-CM

## 2014-07-19 DIAGNOSIS — G4733 Obstructive sleep apnea (adult) (pediatric): Secondary | ICD-10-CM

## 2014-07-19 DIAGNOSIS — I10 Essential (primary) hypertension: Secondary | ICD-10-CM

## 2014-07-19 NOTE — Progress Notes (Signed)
Cardiology Office Note   Date:  07/19/2014   ID:  Benjamin Shepard, DOB September 07, 1945, MRN [id number]  PCP:  Velna Hatchet, MD  Cardiologist:   [name] Grooms, MD   Chief Complaint  Patient presents with  . Hypertension      History of Present Illness: Benjamin Shepard is a 69 y.o. male who presents for lower extremity edema, hypertension, and obstructive sleep apnea.  The patient is doing well. He has noticed some lower extremity swelling. Dyspnea has been under control. He is using C Pap as prescribed. He brought laboratory data from his primary care physician. All numbers are at target.    Past Medical History  Diagnosis Date  . HTN (hypertension)   . Edema   . OSA (obstructive sleep apnea)   . Diabetes   . Gout   . Hyperlipidemia     Past Surgical History  Procedure Laterality Date  . No prior surgery       Current Outpatient Prescriptions  Medication Sig Dispense Refill  . allopurinol (ZYLOPRIM) 100 MG tablet Take 100 mg by mouth daily.    Marland Kitchen amLODipine (NORVASC) 5 MG tablet Take 5 mg by mouth daily.    Marland Kitchen aspirin 81 MG tablet Take 81 mg by mouth daily.    Marland Kitchen atorvastatin (LIPITOR) 20 MG tablet Take 20 mg by mouth daily.    . colchicine 0.6 MG tablet Take 0.6 mg by mouth as needed (gout).     Marland Kitchen doxazosin (CARDURA) 8 MG tablet TAKE 2 TABLETS BY MOUTH EVERY DAY 60 tablet 1  . furosemide (LASIX) 20 MG tablet Take 20 mg by mouth daily as needed (leg swelling).    Marland Kitchen losartan-hydrochlorothiazide (HYZAAR) 100-25 MG per tablet TAKE 1 TABLET BY MOUTH EVERY DAY 30 tablet 11  . metFORMIN (GLUCOPHAGE) 500 MG tablet Take 500 mg by mouth 2 (two) times daily with a meal.    . TAZTIA XT 360 MG 24 hr capsule TAKE 1 CAPSULE BY MOUTH EVERY DAY 30 capsule 1   No current facility-administered medications for this visit.    Allergies:   Other    Social History:  The patient  reports that he quit smoking about 16 years ago. His smoking use included Cigarettes. He has  never used smokeless tobacco. He reports that he drinks alcohol. He reports that he does not use illicit drugs.   Family History:  The patient's family history includes Heart Problems in his mother; Heart disease in his mother; Heart failure in his mother; Hypertension in his father. There is no history of Colon cancer.    ROS:  Please see the history of present illness.   Otherwise, review of systems are positive for globin A1c is 6.1. Bilateral lower extremity swelling. Sleeping relatively well..   All other systems are reviewed and negative.    PHYSICAL EXAM: VS:  BP 140/60 mmHg  Pulse 73  Ht 6\' 1"  (1.854 m)  Wt 343 lb 12.8 oz (155.947 kg)  BMI 45.37 kg/m2 , BMI Body mass index is 45.37 kg/(m^2). GEN: Well nourished, well developed, in no acute distress HEENT: normal Neck: no JVD, carotid bruits, or masses Cardiac: RRR; no murmurs, rubs, or gallops. 1-2+ bilateral lower extremity edema . Respiratory:  clear to auscultation bilaterally, normal work of breathing GI: soft, nontender, nondistended, + BS MS: no deformity or atrophy Skin: warm and dry, no rash Neuro:  Strength and sensation are intact Psych: euthymic mood, full affect   EKG:  EKG is  ordered today. The ekg ordered today demonstrates normal sinus rhythm, incomplete right bundle branch block, and nonspecific ST-T wave abnormality.   Recent Labs: No results found for requested labs within last 365 days.    Lipid Panel No results found for: CHOL, TRIG, HDL, CHOLHDL, VLDL, LDLCALC, LDLDIRECT    Wt Readings from Last 3 Encounters:  07/19/14 343 lb 12.8 oz (155.947 kg)  08/10/13 324 lb (146.965 kg)  07/07/13 324 lb 9.6 oz (147.238 kg)      Other studies Reviewed: Additional studies/ records that were reviewed today include: View laboratory data from Dr Ardeth Perfect. Review of the above records demonstrates: Globin A1c 6.1 total cholesterol 137 LDL cholesterol 79   ASSESSMENT AND PLAN:  Essential hypertension: The  blood pressure is controlled  OSA (obstructive sleep apnea): Continues to use sleep equipment  Lower Extremity Edema: Moderate bilateral lower extremity swelling. Recently Aldactone was discontinued and furosemide 20 mg per day started by     Current medicines are reviewed at length with the patient today.  The patient does not have concerns regarding medicines.  The following changes have been made:  no change  Labs/ tests ordered today include:  No orders of the defined types were placed in this encounter.     Disposition:   FU with Linard Millers in 1 year  Signed, [name] Grooms, MD  07/19/2014 4:26 PM    [facility] [address], [place], [place]  [address] Phone: [phone]; Fax: [phone]

## 2014-07-19 NOTE — Patient Instructions (Signed)

## 2014-09-10 ENCOUNTER — Other Ambulatory Visit: Payer: Self-pay | Admitting: Interventional Cardiology

## 2014-09-21 ENCOUNTER — Other Ambulatory Visit: Payer: Self-pay | Admitting: Interventional Cardiology

## 2014-09-22 ENCOUNTER — Other Ambulatory Visit: Payer: Self-pay

## 2014-09-22 MED ORDER — LOSARTAN POTASSIUM-HCTZ 100-25 MG PO TABS: 1.0000 | ORAL_TABLET | Freq: Every day | ORAL | Status: DC

## 2015-02-03 DIAGNOSIS — Z23 Encounter for immunization: Secondary | ICD-10-CM | POA: Diagnosis not present

## 2015-02-27 DIAGNOSIS — R3129 Other microscopic hematuria: Secondary | ICD-10-CM | POA: Diagnosis not present

## 2015-02-27 DIAGNOSIS — Z6841 Body Mass Index (BMI) 40.0 and over, adult: Secondary | ICD-10-CM | POA: Diagnosis not present

## 2015-02-27 DIAGNOSIS — R339 Retention of urine, unspecified: Secondary | ICD-10-CM | POA: Diagnosis not present

## 2015-02-27 DIAGNOSIS — I10 Essential (primary) hypertension: Secondary | ICD-10-CM | POA: Diagnosis not present

## 2015-02-27 DIAGNOSIS — E119 Type 2 diabetes mellitus without complications: Secondary | ICD-10-CM | POA: Diagnosis not present

## 2015-02-28 ENCOUNTER — Encounter (HOSPITAL_COMMUNITY): Payer: Self-pay | Admitting: Emergency Medicine

## 2015-02-28 ENCOUNTER — Emergency Department (HOSPITAL_COMMUNITY)
Admission: EM | Admit: 2015-02-28 | Discharge: 2015-02-28 | Disposition: A | Discharge status: Home / Self Care | Payer: Medicare Other | Attending: Emergency Medicine | Admitting: Emergency Medicine

## 2015-02-28 DIAGNOSIS — Z7984 Long term (current) use of oral hypoglycemic drugs: Secondary | ICD-10-CM | POA: Diagnosis not present

## 2015-02-28 DIAGNOSIS — R339 Retention of urine, unspecified: Secondary | ICD-10-CM

## 2015-02-28 DIAGNOSIS — Z8669 Personal history of other diseases of the nervous system and sense organs: Secondary | ICD-10-CM | POA: Diagnosis not present

## 2015-02-28 DIAGNOSIS — Z79899 Other long term (current) drug therapy: Secondary | ICD-10-CM | POA: Diagnosis not present

## 2015-02-28 DIAGNOSIS — M109 Gout, unspecified: Secondary | ICD-10-CM | POA: Diagnosis not present

## 2015-02-28 DIAGNOSIS — E119 Type 2 diabetes mellitus without complications: Secondary | ICD-10-CM | POA: Diagnosis not present

## 2015-02-28 DIAGNOSIS — Z7982 Long term (current) use of aspirin: Secondary | ICD-10-CM | POA: Diagnosis not present

## 2015-02-28 DIAGNOSIS — R34 Anuria and oliguria: Secondary | ICD-10-CM | POA: Insufficient documentation

## 2015-02-28 DIAGNOSIS — R39198 Other difficulties with micturition: Secondary | ICD-10-CM | POA: Insufficient documentation

## 2015-02-28 DIAGNOSIS — N210 Calculus in bladder: Secondary | ICD-10-CM | POA: Diagnosis not present

## 2015-02-28 DIAGNOSIS — N400 Enlarged prostate without lower urinary tract symptoms: Secondary | ICD-10-CM | POA: Diagnosis not present

## 2015-02-28 DIAGNOSIS — R1030 Lower abdominal pain, unspecified: Secondary | ICD-10-CM | POA: Diagnosis not present

## 2015-02-28 DIAGNOSIS — I10 Essential (primary) hypertension: Secondary | ICD-10-CM | POA: Insufficient documentation

## 2015-02-28 DIAGNOSIS — E785 Hyperlipidemia, unspecified: Secondary | ICD-10-CM | POA: Insufficient documentation

## 2015-02-28 DIAGNOSIS — Z87891 Personal history of nicotine dependence: Secondary | ICD-10-CM | POA: Insufficient documentation

## 2015-02-28 LAB — URINALYSIS, ROUTINE W REFLEX MICROSCOPIC
BILIRUBIN URINE: NEGATIVE
Glucose, UA: NEGATIVE mg/dL
Ketones, ur: NEGATIVE mg/dL
Leukocytes, UA: NEGATIVE
NITRITE: NEGATIVE
PH: 6 (ref 5.0–8.0)
Protein, ur: NEGATIVE mg/dL
SPECIFIC GRAVITY, URINE: 1.014 (ref 1.005–1.030)

## 2015-02-28 LAB — URINE MICROSCOPIC-ADD ON
BACTERIA UA: NONE SEEN
SQUAMOUS EPITHELIAL / LPF: NONE SEEN

## 2015-02-28 MED ORDER — TAMSULOSIN HCL 0.4 MG PO CAPS: 0.4000 mg | ORAL_CAPSULE | Freq: Every day | ORAL | Status: DC

## 2015-02-28 NOTE — ED Provider Notes (Signed)
  Face-to-face evaluation   History: he presents with decreased urinary output for 3 days, with similar symptoms in the past. He was at his PCP office today, and after urinalysis showed hematuria, he had a CT scan done. He was sent here "for a Foley".   Physical exam:Alert, obese man. He is comfortable. There is about a liter of clear yellow urine in his Foley catheter bag.  Medical screening examination/treatment/procedure(s) were conducted as a shared visit with non-physician practitioner(s) and myself.  I personally evaluated the patient during the encounter  Daleen Bo, MD 03/01/15 [phone]

## 2015-02-28 NOTE — ED Provider Notes (Signed)
CSN: [id number]     Arrival date & time 02/28/15  1657 History  By signing my name below, I, Meriel Pica, attest that this documentation has been prepared under the direction and in the presence of Guadalupe Kerekes, PA-C. Electronically Signed: Meriel Pica, ED Scribe. 02/28/2015. 6:07 PM.   Chief Complaint  Patient presents with  . Urinary Retention   The history is provided by the patient. No language interpreter was used.   HPI Comments: Benjamin Shepard is a 69 y.o. male who presents to the Emergency Department complaining of the gradually worsening, constant feeling of urinary retention with decreased urine volume X 3 days. Pt states he has been visiting the bathroom increasingly more frequent but has only been dribbling urine since 3 days ago. He had a CT scan of bladder performed at [facility] today that demonstrate an enlarged prostate (8.4 cm) with 3 stones in left side of bladder. An NP at his PCP with [place] prescribed ciprofloxacin and advised the pt to drink plenty of fluids and present to the ED for foley and Urology referral. Foley catheter placed in triage with relief of 928ml of urine.He associates pain to suprapubic abdomen with relief after foley catheter placement. He has no other complaints today.   Past Medical History  Diagnosis Date  . HTN (hypertension)   . Edema   . OSA (obstructive sleep apnea)   . Diabetes ([place])   . Gout   . Hyperlipidemia    Past Surgical History  Procedure Laterality Date  . No prior surgery     Family History  Problem Relation Age of Onset  . Hypertension Father   . Heart Problems Mother   . Heart failure Mother   . Heart disease Mother   . Colon cancer Neg Hx    Social History  Substance Use Topics  . Smoking status: Former Smoker    Types: Cigarettes    Quit date: 03/15/1998  . Smokeless tobacco: Never Used  . Alcohol Use: Yes     Comment: rare wine, beer    Review of Systems  Constitutional:  Negative for fever and chills.  Respiratory: Negative for shortness of breath.   Cardiovascular: Negative for chest pain.  Gastrointestinal: Positive for abdominal pain. Negative for nausea and vomiting.  Genitourinary: Positive for decreased urine volume and difficulty urinating. Negative for dysuria, frequency, hematuria and flank pain.  All other systems reviewed and are negative.  Allergies  Other  Home Medications   Prior to Admission medications   Medication Sig Start Date End Date Taking? Authorizing Provider  allopurinol (ZYLOPRIM) 100 MG tablet Take 100 mg by mouth daily.    Historical Provider, MD  amLODipine (NORVASC) 5 MG tablet Take 5 mg by mouth daily.    Historical Provider, MD  aspirin 81 MG tablet Take 81 mg by mouth daily.    Historical Provider, MD  atorvastatin (LIPITOR) 20 MG tablet Take 20 mg by mouth daily.    Historical Provider, MD  colchicine 0.6 MG tablet Take 0.6 mg by mouth as needed (gout).     Historical Provider, MD  doxazosin (CARDURA) 8 MG tablet TAKE 2 TABLETS BY MOUTH EVERY DAY 09/12/14   Belva Crome, MD  furosemide (LASIX) 20 MG tablet Take 20 mg by mouth daily as needed (leg swelling).    Historical Provider, MD  losartan-hydrochlorothiazide (HYZAAR) 100-25 MG per tablet Take 1 tablet by mouth daily. 09/22/14   Sherren Mocha, MD  metFORMIN (GLUCOPHAGE) 500 MG tablet  Take 500 mg by mouth 2 (two) times daily with a meal.    Historical Provider, MD  tamsulosin (FLOMAX) 0.4 MG CAPS capsule Take 1 capsule (0.4 mg total) by mouth at bedtime. 02/28/15   Eduard Penkala, PA-C  TAZTIA XT 360 MG 24 hr capsule TAKE 1 CAPSULE BY MOUTH EVERY DAY 09/12/14   Belva Crome, MD   BP 138/58 mmHg  Pulse 80  Temp(Src) 97.4 F (36.3 C) (Oral)  Resp 32  Wt 319 lb 4 oz (144.811 kg)  SpO2 98% Physical Exam  Constitutional: He appears well-developed and well-nourished. No distress.  Patient is nontoxic-appearing.  HENT:  Head: Normocephalic and atraumatic.  Right Ear:  External ear normal.  Left Ear: External ear normal.  Eyes: Conjunctivae are normal. Right eye exhibits no discharge. Left eye exhibits no discharge. No scleral icterus.  Neck: Normal range of motion.  Cardiovascular: Normal rate.   Pulmonary/Chest: Effort normal.  Abdominal: Soft. Bowel sounds are normal. He exhibits no distension. There is no tenderness. There is no rebound and no guarding.  Genitourinary:  Foley catheter in place draining straw-colored urine  Musculoskeletal: Normal range of motion.  Moves all extremities spontaneously and walks with a steady gait  Neurological: He is alert. Coordination normal.  Skin: Skin is warm and dry.  Psychiatric: He has a normal mood and affect. His behavior is normal.  Nursing note and vitals reviewed.   ED Course  Procedures  DIAGNOSTIC STUDIES: Oxygen Saturation is 98% on RA, normal by my interpretation.    COORDINATION OF CARE: 6:04 PM Discussed treatment plan which includes to consult with Dr. Eulis Foster with pt. Pt acknowledges and agrees to plan.  6:42 PM Consulted with Dr. Eulis Foster. Will order urine culture, prescribe Flomax and give referral to [facility] follow up.   Labs Review Labs Reviewed  URINE CULTURE  URINALYSIS, ROUTINE W REFLEX MICROSCOPIC (NOT AT [facility])   I have personally reviewed and evaluated these lab results as part of my medical decision-making.   MDM   Final diagnoses:  Urinary retention   Patient presenting from primary care office for Foley placement and urology referral with a 3 day history of urinary retention. Patient was evaluated by PCP and sent for CT scan earlier this afternoon. CT positive for 8.4 cm prostate. Patient was then reevaluated in their primary care office and sent to ED. Foley catheter placed with 999 mL return. Patient reports full resolution of symptoms after Foley placement. Discussed case with Dr. Eulis Foster who also saw and evaluated the patient. We'll send urine for culture. Will start  patient on Flomax and give leg bag for catheter. Patient instructed on proper Foley care. Given referral for [facility] and encouraged to call first thing tomorrow morning for follow-up. She will continue Cipro as prescribed by PCP. Return precautions given in discharge paperwork and discussed with pt at bedside. Pt stable for discharge  I personally performed the services described in this documentation, which was scribed in my presence. The recorded information has been reviewed and is accurate.    Josephina Gip, PA-C 02/28/15 2007  Daleen Bo, MD 03/01/15 [phone]

## 2015-02-28 NOTE — Discharge Instructions (Signed)
Call [facility] tomorrow morning to schedule a follow-up appointment. Keep Foley in place until instructed otherwise by urology. Continue taking Cipro as directed by PCP. Take Flomax at night.   Acute Urinary Retention, Male Acute urinary retention is the temporary inability to urinate. This is a common problem in older men. As men age their prostates become larger and block the flow of urine from the bladder. This is usually a problem that has come on gradually.  HOME CARE INSTRUCTIONS If you are sent home with a Foley catheter and a drainage system, you will need to discuss the best course of action with your health care provider. While the catheter is in, maintain a good intake of fluids. Keep the drainage bag emptied and lower than your catheter. This is so that contaminated urine will not flow back into your bladder, which could lead to a urinary tract infection. There are two main types of drainage bags. One is a large bag that usually is used at night. It has a good capacity that will allow you to sleep through the night without having to empty it. The second type is called a leg bag. It has a smaller capacity, so it needs to be emptied more frequently. However, the main advantage is that it can be attached by a leg strap and can go underneath your clothing, allowing you the freedom to move about or leave your home. Only take over-the-counter or prescription medicines for pain, discomfort, or fever as directed by your health care provider.  SEEK MEDICAL CARE IF:  You develop a low-grade fever.  You experience spasms or leakage of urine with the spasms. SEEK IMMEDIATE MEDICAL CARE IF:   You develop chills or fever.  Your catheter stops draining urine.  Your catheter falls out.  You start to develop increased bleeding that does not respond to rest and increased fluid intake. MAKE SURE YOU:  Understand these instructions.  Will watch your condition.  Will get help right away  if you are not doing well or get worse.   This information is not intended to replace advice given to you by your health care provider. Make sure you discuss any questions you have with your health care provider.   Document Released: 06/24/2000 Document Revised: 08/02/2014 Document Reviewed: 08/27/2012 Elsevier Interactive Patient Education [employer].

## 2015-02-28 NOTE — ED Notes (Signed)
Pt presents with urinary retention pt states he has not be able to urinate normally since Saturday. Pt had CT scan today that showed enlarged prostate and 3 stones in left side of bladder. Pt states he just went to restroom but is just having dribbling.

## 2015-03-01 LAB — URINE CULTURE: CULTURE: NO GROWTH

## 2015-03-03 ENCOUNTER — Emergency Department (HOSPITAL_COMMUNITY)
Admission: EM | Admit: 2015-03-03 | Discharge: 2015-03-03 | Disposition: A | Discharge status: Home / Self Care | Payer: Medicare Other | Attending: Emergency Medicine | Admitting: Emergency Medicine

## 2015-03-03 ENCOUNTER — Encounter (HOSPITAL_COMMUNITY): Payer: Self-pay | Admitting: Emergency Medicine

## 2015-03-03 DIAGNOSIS — T839XXA Unspecified complication of genitourinary prosthetic device, implant and graft, initial encounter: Secondary | ICD-10-CM

## 2015-03-03 DIAGNOSIS — E785 Hyperlipidemia, unspecified: Secondary | ICD-10-CM | POA: Diagnosis not present

## 2015-03-03 DIAGNOSIS — E119 Type 2 diabetes mellitus without complications: Secondary | ICD-10-CM | POA: Insufficient documentation

## 2015-03-03 DIAGNOSIS — M109 Gout, unspecified: Secondary | ICD-10-CM | POA: Insufficient documentation

## 2015-03-03 DIAGNOSIS — E669 Obesity, unspecified: Secondary | ICD-10-CM | POA: Diagnosis not present

## 2015-03-03 DIAGNOSIS — T83098A Other mechanical complication of other indwelling urethral catheter, initial encounter: Secondary | ICD-10-CM | POA: Insufficient documentation

## 2015-03-03 DIAGNOSIS — Z792 Long term (current) use of antibiotics: Secondary | ICD-10-CM | POA: Diagnosis not present

## 2015-03-03 DIAGNOSIS — I10 Essential (primary) hypertension: Secondary | ICD-10-CM | POA: Insufficient documentation

## 2015-03-03 DIAGNOSIS — Z87891 Personal history of nicotine dependence: Secondary | ICD-10-CM | POA: Insufficient documentation

## 2015-03-03 DIAGNOSIS — Z8669 Personal history of other diseases of the nervous system and sense organs: Secondary | ICD-10-CM | POA: Insufficient documentation

## 2015-03-03 DIAGNOSIS — Z79899 Other long term (current) drug therapy: Secondary | ICD-10-CM | POA: Insufficient documentation

## 2015-03-03 DIAGNOSIS — Z7982 Long term (current) use of aspirin: Secondary | ICD-10-CM | POA: Insufficient documentation

## 2015-03-03 DIAGNOSIS — R339 Retention of urine, unspecified: Secondary | ICD-10-CM | POA: Diagnosis present

## 2015-03-03 DIAGNOSIS — Y658 Other specified misadventures during surgical and medical care: Secondary | ICD-10-CM | POA: Insufficient documentation

## 2015-03-03 DIAGNOSIS — R319 Hematuria, unspecified: Secondary | ICD-10-CM | POA: Diagnosis not present

## 2015-03-03 LAB — URINE MICROSCOPIC-ADD ON

## 2015-03-03 LAB — URINALYSIS, ROUTINE W REFLEX MICROSCOPIC
BILIRUBIN URINE: NEGATIVE
Glucose, UA: NEGATIVE mg/dL
KETONES UR: NEGATIVE mg/dL
Leukocytes, UA: NEGATIVE
NITRITE: NEGATIVE
Protein, ur: 30 mg/dL — AB
SPECIFIC GRAVITY, URINE: 1.023 (ref 1.005–1.030)
pH: 6.5 (ref 5.0–8.0)

## 2015-03-03 NOTE — ED Provider Notes (Signed)
CSN: [id number]     Arrival date & time 03/03/15  1000 History   First MD Initiated Contact with Patient 03/03/15 1027     Chief Complaint  Patient presents with  . Urinary Retention  . Hematuria     (Consider location/radiation/quality/duration/timing/severity/associated sxs/prior Treatment) HPI Comments: Patient is a 69 year old male with history of hypertension, sleep apnea, diabetes, and obesity. He presents for evaluation of a plugged Foley catheter. He was seen here 3 days ago for urinary retention and a Foley catheter was placed. He was told that he had an enlarged prostate by his CT scan and is to follow-up with urology. He presents today stating that he is having lower abdominal pressure and has not had urine output in his catheter since last night. There is a slight amount of bloody urine present in the Foley bag.  Patient is a 69 y.o. male presenting with hematuria. The history is provided by the patient.  Hematuria This is a new problem. The current episode started 2 days ago. The problem occurs constantly. The problem has been gradually worsening. Nothing aggravates the symptoms. Nothing relieves the symptoms. He has tried nothing for the symptoms. The treatment provided no relief.    Past Medical History  Diagnosis Date  . HTN (hypertension)   . Edema   . OSA (obstructive sleep apnea)   . Diabetes ([place])   . Gout   . Hyperlipidemia    Past Surgical History  Procedure Laterality Date  . No prior surgery     Family History  Problem Relation Age of Onset  . Hypertension Father   . Heart Problems Mother   . Heart failure Mother   . Heart disease Mother   . Colon cancer Neg Hx    Social History  Substance Use Topics  . Smoking status: Former Smoker    Types: Cigarettes    Quit date: 03/15/1998  . Smokeless tobacco: Never Used  . Alcohol Use: Yes     Comment: rare wine, beer    Review of Systems  Genitourinary: Positive for hematuria.  All other systems  reviewed and are negative.     Allergies  Other  Home Medications   Prior to Admission medications   Medication Sig Start Date End Date Taking? Authorizing Provider  aspirin 81 MG tablet Take 81 mg by mouth daily.   Yes Historical Provider, MD  ciprofloxacin (CIPRO) 500 MG tablet Take 500 mg by mouth 2 (two) times daily.   Yes Historical Provider, MD  doxazosin (CARDURA) 8 MG tablet TAKE 2 TABLETS BY MOUTH EVERY DAY 09/12/14  Yes Belva Crome, MD  metFORMIN (GLUCOPHAGE) 500 MG tablet Take 500 mg by mouth 2 (two) times daily with a meal.   Yes Historical Provider, MD  allopurinol (ZYLOPRIM) 100 MG tablet Take 100 mg by mouth daily.    Historical Provider, MD  amLODipine (NORVASC) 5 MG tablet Take 5 mg by mouth daily.    Historical Provider, MD  atorvastatin (LIPITOR) 20 MG tablet Take 20 mg by mouth daily.    Historical Provider, MD  colchicine 0.6 MG tablet Take 0.6 mg by mouth as needed (gout).     Historical Provider, MD  furosemide (LASIX) 20 MG tablet Take 20 mg by mouth daily as needed (leg swelling).    Historical Provider, MD  losartan-hydrochlorothiazide (HYZAAR) 100-25 MG per tablet Take 1 tablet by mouth daily. 09/22/14   Sherren Mocha, MD  tamsulosin (FLOMAX) 0.4 MG CAPS capsule Take 1 capsule (0.4 mg  total) by mouth at bedtime. 02/28/15   Stevi Barrett, PA-C  TAZTIA XT 360 MG 24 hr capsule TAKE 1 CAPSULE BY MOUTH EVERY DAY 09/12/14   Belva Crome, MD   BP 136/71 mmHg  Pulse 79  Temp(Src) 99.2 F (37.3 C) (Oral)  Resp 20  Ht 6\' 1"  (1.854 m)  Wt 319 lb 4 oz (144.811 kg)  BMI 42.13 kg/m2  SpO2 98% Physical Exam  Constitutional: He is oriented to person, place, and time. He appears well-developed and well-nourished. No distress.  HENT:  Head: Normocephalic and atraumatic.  Neck: Normal range of motion. Neck supple.  Cardiovascular: Normal rate, regular rhythm and normal heart sounds.   No murmur heard. Pulmonary/Chest: Effort normal and breath sounds normal. No  respiratory distress. He has no wheezes. He has no rales.  Abdominal: Soft. Bowel sounds are normal. He exhibits no distension. There is tenderness.  Abdomen is obese. There is tenderness in the suprapubic region.  Musculoskeletal: Normal range of motion. He exhibits no edema.  Neurological: He is alert and oriented to person, place, and time.  Skin: Skin is warm and dry. He is not diaphoretic.  Nursing note and vitals reviewed.   ED Course  Procedures (including critical care time) Labs Review Labs Reviewed  URINALYSIS, ROUTINE W REFLEX MICROSCOPIC (NOT AT [facility])    Imaging Review No results found. I have personally reviewed and evaluated these images and lab results as part of my medical decision-making.   EKG Interpretation None      MDM   Final diagnoses:  None    Patient presents with a clogged Foley catheter. He was able to be flushed, however urine would not be pulled back. The catheter was removed and a new one was placed. 1400 mL of urine were obtained and the patient's symptoms resolved. He has an appointment with urology on Monday. He will be discharged and advised to follow-up with them as scheduled. His urine culture from 3 days ago was reviewed and is revealing no growth. He is currently on Cipro and is to continue this.    Veryl Speak, MD 03/03/15 1124

## 2015-03-03 NOTE — ED Notes (Signed)
Pt c/o urinary retention since last Sunday. Pt presents with foley in place. Reports decrease urine output onset last night. Pt currently taking flomax since Wednesday. Blood noted in foley bag.

## 2015-03-03 NOTE — Discharge Instructions (Signed)
Continue taking your Cipro as before.  Follow-up with urology on Monday as scheduled.

## 2015-03-06 DIAGNOSIS — R338 Other retention of urine: Secondary | ICD-10-CM | POA: Diagnosis not present

## 2015-03-06 DIAGNOSIS — N401 Enlarged prostate with lower urinary tract symptoms: Secondary | ICD-10-CM | POA: Diagnosis not present

## 2015-03-06 DIAGNOSIS — N210 Calculus in bladder: Secondary | ICD-10-CM | POA: Diagnosis not present

## 2015-03-06 DIAGNOSIS — R3129 Other microscopic hematuria: Secondary | ICD-10-CM | POA: Diagnosis not present

## 2015-03-06 DIAGNOSIS — R351 Nocturia: Secondary | ICD-10-CM | POA: Diagnosis not present

## 2015-03-08 DIAGNOSIS — R3129 Other microscopic hematuria: Secondary | ICD-10-CM | POA: Diagnosis not present

## 2015-03-08 DIAGNOSIS — R351 Nocturia: Secondary | ICD-10-CM | POA: Diagnosis not present

## 2015-03-08 DIAGNOSIS — N210 Calculus in bladder: Secondary | ICD-10-CM | POA: Diagnosis not present

## 2015-03-08 DIAGNOSIS — R338 Other retention of urine: Secondary | ICD-10-CM | POA: Diagnosis not present

## 2015-03-08 DIAGNOSIS — R39198 Other difficulties with micturition: Secondary | ICD-10-CM | POA: Diagnosis not present

## 2015-03-08 DIAGNOSIS — N401 Enlarged prostate with lower urinary tract symptoms: Secondary | ICD-10-CM | POA: Diagnosis not present

## 2015-03-13 DIAGNOSIS — R3129 Other microscopic hematuria: Secondary | ICD-10-CM | POA: Diagnosis not present

## 2015-03-13 DIAGNOSIS — N401 Enlarged prostate with lower urinary tract symptoms: Secondary | ICD-10-CM | POA: Diagnosis not present

## 2015-03-13 DIAGNOSIS — N210 Calculus in bladder: Secondary | ICD-10-CM | POA: Diagnosis not present

## 2015-03-13 DIAGNOSIS — R338 Other retention of urine: Secondary | ICD-10-CM | POA: Diagnosis not present

## 2015-03-27 ENCOUNTER — Emergency Department (HOSPITAL_COMMUNITY)
Admission: EM | Admit: 2015-03-27 | Discharge: 2015-03-28 | Disposition: A | Discharge status: Home / Self Care | Payer: Medicare Other | Attending: Emergency Medicine | Admitting: Emergency Medicine

## 2015-03-27 ENCOUNTER — Encounter (HOSPITAL_COMMUNITY): Payer: Self-pay | Admitting: Emergency Medicine

## 2015-03-27 DIAGNOSIS — Z87891 Personal history of nicotine dependence: Secondary | ICD-10-CM | POA: Diagnosis not present

## 2015-03-27 DIAGNOSIS — Z79899 Other long term (current) drug therapy: Secondary | ICD-10-CM | POA: Diagnosis not present

## 2015-03-27 DIAGNOSIS — Z7982 Long term (current) use of aspirin: Secondary | ICD-10-CM | POA: Diagnosis not present

## 2015-03-27 DIAGNOSIS — Z792 Long term (current) use of antibiotics: Secondary | ICD-10-CM | POA: Insufficient documentation

## 2015-03-27 DIAGNOSIS — I10 Essential (primary) hypertension: Secondary | ICD-10-CM | POA: Insufficient documentation

## 2015-03-27 DIAGNOSIS — R339 Retention of urine, unspecified: Secondary | ICD-10-CM | POA: Diagnosis present

## 2015-03-27 DIAGNOSIS — Z8669 Personal history of other diseases of the nervous system and sense organs: Secondary | ICD-10-CM | POA: Diagnosis not present

## 2015-03-27 DIAGNOSIS — E785 Hyperlipidemia, unspecified: Secondary | ICD-10-CM | POA: Diagnosis not present

## 2015-03-27 DIAGNOSIS — N390 Urinary tract infection, site not specified: Secondary | ICD-10-CM | POA: Diagnosis not present

## 2015-03-27 DIAGNOSIS — E119 Type 2 diabetes mellitus without complications: Secondary | ICD-10-CM | POA: Diagnosis not present

## 2015-03-27 DIAGNOSIS — M109 Gout, unspecified: Secondary | ICD-10-CM | POA: Insufficient documentation

## 2015-03-27 NOTE — ED Notes (Signed)
Patient here with complaint of blocked urinary catheter. States he has had the catheter in place for 3 weeks because of urinary retention secondary to an enlarged prostate. Reports several occurences of blocked catheters since having it placed. States that he has been feeling the urge to urinate and when going to the restroom notes the urine passing around his catheter.

## 2015-03-28 DIAGNOSIS — N390 Urinary tract infection, site not specified: Secondary | ICD-10-CM | POA: Diagnosis not present

## 2015-03-28 LAB — URINE MICROSCOPIC-ADD ON

## 2015-03-28 LAB — URINALYSIS, ROUTINE W REFLEX MICROSCOPIC
GLUCOSE, UA: NEGATIVE mg/dL
KETONES UR: 15 mg/dL — AB
Nitrite: POSITIVE — AB
PROTEIN: 100 mg/dL — AB
Specific Gravity, Urine: 1.025 (ref 1.005–1.030)
pH: 6 (ref 5.0–8.0)

## 2015-03-28 MED ORDER — ONDANSETRON 8 MG PO TBDP: 8.0000 mg | ORAL_TABLET | Freq: Three times a day (TID) | ORAL | Status: DC | PRN

## 2015-03-28 MED ORDER — CEPHALEXIN 500 MG PO CAPS: 500.0000 mg | ORAL_CAPSULE | Freq: Two times a day (BID) | ORAL | Status: DC

## 2015-03-28 MED ORDER — CEPHALEXIN 250 MG PO CAPS
500.0000 mg | ORAL_CAPSULE | Freq: Once | ORAL | Status: AC
  Administered 2015-03-28: 500 mg via ORAL
  Filled 2015-03-28: qty 2

## 2015-03-28 NOTE — Discharge Instructions (Signed)
Catheter-Associated Urinary Tract Infection FAQs  What is "catheter-associated urinary tract infection"?  A urinary tract infection (also called "UTI") is an infection in the urinary system, which includes the bladder (which stores the urine) and the kidneys (which filter the blood to make urine). Germs (for example, bacteria or yeasts) do not normally live in these areas; but if germs are introduced, an infection can occur.  If you have a urinary catheter, germs can travel along the catheter and cause an infection in your bladder or your kidney; in that case it is called a catheter-associated urinary tract infection (or "CA-UTI").   What is a urinary catheter?  A urinary catheter is a thin tube placed in the bladder to drain urine. Urine drains through the tube into a bag that collects the urine. A urinary catheter may be used:  · If you are not able to urinate on your own  · To measure the amount of urine that you make, for example, during intensive care  · During and after some types of surgery  · During some tests of the kidneys and bladder  People with urinary catheters have a much higher chance of getting a urinary tract infection than people who don't have a catheter.  How do I get a catheter-associated urinary tract infection (CA-UTI)?  If germs enter the urinary tract, they may cause an infection. Many of the germs that cause a catheter-associated urinary tract infection are common germs found in your intestines that do not usually cause an infection there. Germs can enter the urinary tract when the catheter is being put in or while the catheter remains in the bladder.   What are the symptoms of a urinary tract infection?  Some of the common symptoms of a urinary tract infection are:  · Burning or pain in the lower abdomen (that is, below the stomach)  · Fever  · Bloody urine may be a sign of infection, but is also caused by other problems  · Burning during urination or an increase in the frequency of  urination after the catheter is removed.  Sometimes people with catheter-associated urinary tract infections do not have these symptoms of infection.  Can catheter-associated urinary tract infections be treated?  Yes, most catheter-associated urinary tract infections can be treated with antibiotics and removal or change of the catheter. Your doctor will determine which antibiotic is best for you.   What are some of the things that hospitals are doing to prevent catheter-associated urinary tract infections?  To prevent urinary tract infections, doctors and nurses take the following actions.   Catheter insertion  · External catheters in men (these look like condoms and are placed over the penis rather than into the penis)  · Putting a temporary catheter in to drain the urine and removing it right away. This is called intermittent urethral catheterization.  Catheter care  What can I do to help prevent catheter-associated urinary tract infections if I have a catheter?  · Always clean your hands before and after doing catheter care.  · Always keep your urine bag below the level of your bladder.  · Do not tug or pull on the tubing.  · Do not twist or kink the catheter tubing.  · Ask your healthcare provider each day if you still need the catheter.  What do I need to do when I go home from the hospital?  · If you will be going home with a catheter, your doctor or nurse should explain everything   tract infection, such as burning or pain in the lower abdomen, fever, or an increase in the frequency of urination, contact your doctor or nurse immediately. °· Before you go home, make sure you know who to contact if you have questions or problems after you get home. °If you have questions, please ask your doctor or nurse. °Developed and co-sponsored by The  Society for Healthcare Epidemiology of America (SHEA); Infectious Diseases Society of America (IDSA); American Hospital Association; Association for Professionals in Infection Control and Epidemiology (APIC); Centers for Disease Control and Prevention (CDC); and The Joint Commission. °  °This information is not intended to replace advice given to you by your health care provider. Make sure you discuss any questions you have with your health care provider. °  °Document Released: 12/11/2011 Document Revised: 08/02/2014 Document Reviewed: 06/01/2014 °Elsevier Interactive Patient Education ©2016 Elsevier Inc. ° °Urinary Tract Infection °Urinary tract infections (UTIs) can develop anywhere along your urinary tract. Your urinary tract is your body's drainage system for removing wastes and extra water. Your urinary tract includes two kidneys, two ureters, a bladder, and a urethra. Your kidneys are a pair of bean-shaped organs. Each kidney is about the size of your fist. They are located below your ribs, one on each side of your spine. °CAUSES °Infections are caused by microbes, which are microscopic organisms, including fungi, viruses, and bacteria. These organisms are so small that they can only be seen through a microscope. Bacteria are the microbes that most commonly cause UTIs. °SYMPTOMS  °Symptoms of UTIs may vary by age and gender of the patient and by the location of the infection. Symptoms in young women typically include a frequent and intense urge to urinate and a painful, burning feeling in the bladder or urethra during urination. Older women and men are more likely to be tired, shaky, and weak and have muscle aches and abdominal pain. A fever may mean the infection is in your kidneys. Other symptoms of a kidney infection include pain in your back or sides below the ribs, nausea, and vomiting. °DIAGNOSIS °To diagnose a UTI, your caregiver will ask you about your symptoms. Your caregiver will also ask you to  provide a urine sample. The urine sample will be tested for bacteria and white blood cells. White blood cells are made by your body to help fight infection. °TREATMENT  °Typically, UTIs can be treated with medication. Because most UTIs are caused by a bacterial infection, they usually can be treated with the use of antibiotics. The choice of antibiotic and length of treatment depend on your symptoms and the type of bacteria causing your infection. °HOME CARE INSTRUCTIONS °· If you were prescribed antibiotics, take them exactly as your caregiver instructs you. Finish the medication even if you feel better after you have only taken some of the medication. °· Drink enough water and fluids to keep your urine clear or pale yellow. °· Avoid caffeine, tea, and carbonated beverages. They tend to irritate your bladder. °· Empty your bladder often. Avoid holding urine for long periods of time. °· Empty your bladder before and after sexual intercourse. °· After a bowel movement, women should cleanse from front to back. Use each tissue only once. °SEEK MEDICAL CARE IF:  °· You have back pain. °· You develop a fever. °· Your symptoms do not begin to resolve within 3 days. °SEEK IMMEDIATE MEDICAL CARE IF:  °· You have severe back pain or lower abdominal pain. °· You develop chills. °· You have   nausea or vomiting. °· You have continued burning or discomfort with urination. °MAKE SURE YOU:  °· Understand these instructions. °· Will watch your condition. °· Will get help right away if you are not doing well or get worse. °  °This information is not intended to replace advice given to you by your health care provider. Make sure you discuss any questions you have with your health care provider. °  °Document Released: 12/26/2004 Document Revised: 12/07/2014 Document Reviewed: 04/26/2011 °Elsevier Interactive Patient Education ©2016 Elsevier Inc. ° °

## 2015-03-28 NOTE — ED Provider Notes (Signed)
CSN: [id number]     Arrival date & time 03/27/15  1844 History   First MD Initiated Contact with Patient 03/27/15 2348     Chief Complaint  Patient presents with  . Urinary Retention     (Consider location/radiation/quality/duration/timing/severity/associated sxs/prior Treatment) HPI Comments: Pt comes in with cc of urinary urgency. Pt has hx of indwelling foley catheter for the past several days. Reports that starting today he has been having urinary urgency and he thinks the urine output has declined. Also reports that the urine is coming around the foley catheter. Pt has no back pain. No n/v/f/c.  The history is provided by the patient.    Past Medical History  Diagnosis Date  . HTN (hypertension)   . Edema   . OSA (obstructive sleep apnea)   . Diabetes ([place])   . Gout   . Hyperlipidemia    Past Surgical History  Procedure Laterality Date  . No prior surgery     Family History  Problem Relation Age of Onset  . Hypertension Father   . Heart Problems Mother   . Heart failure Mother   . Heart disease Mother   . Colon cancer Neg Hx    Social History  Substance Use Topics  . Smoking status: Former Smoker    Types: Cigarettes    Quit date: 03/15/1998  . Smokeless tobacco: Never Used  . Alcohol Use: Yes     Comment: rare wine, beer    Review of Systems  Constitutional: Negative for activity change and appetite change.  Respiratory: Negative for cough and shortness of breath.   Cardiovascular: Negative for chest pain.  Gastrointestinal: Positive for abdominal pain. Negative for nausea.  Genitourinary: Positive for decreased urine volume and difficulty urinating.      Allergies  Other  Home Medications   Prior to Admission medications   Medication Sig Start Date End Date Taking? Authorizing Provider  allopurinol (ZYLOPRIM) 100 MG tablet Take 100 mg by mouth daily.    Historical Provider, MD  amLODipine (NORVASC) 5 MG tablet Take 5 mg by mouth daily.     Historical Provider, MD  aspirin 81 MG tablet Take 81 mg by mouth daily.    Historical Provider, MD  atorvastatin (LIPITOR) 20 MG tablet Take 20 mg by mouth daily.    Historical Provider, MD  cephALEXin (KEFLEX) 500 MG capsule Take 1 capsule (500 mg total) by mouth 2 (two) times daily. 03/28/15   Varney Biles, MD  ciprofloxacin (CIPRO) 500 MG tablet Take 500 mg by mouth 2 (two) times daily.    Historical Provider, MD  colchicine 0.6 MG tablet Take 0.6 mg by mouth as needed (gout).     Historical Provider, MD  doxazosin (CARDURA) 8 MG tablet TAKE 2 TABLETS BY MOUTH EVERY DAY 09/12/14   Belva Crome, MD  furosemide (LASIX) 20 MG tablet Take 20 mg by mouth daily as needed (leg swelling).    Historical Provider, MD  losartan-hydrochlorothiazide (HYZAAR) 100-25 MG per tablet Take 1 tablet by mouth daily. 09/22/14   Sherren Mocha, MD  metFORMIN (GLUCOPHAGE) 500 MG tablet Take 500 mg by mouth 2 (two) times daily with a meal.    Historical Provider, MD  ondansetron (ZOFRAN ODT) 8 MG disintegrating tablet Take 1 tablet (8 mg total) by mouth every 8 (eight) hours as needed for nausea. 03/28/15   Varney Biles, MD  tamsulosin (FLOMAX) 0.4 MG CAPS capsule Take 1 capsule (0.4 mg total) by mouth at bedtime. 02/28/15  Stevi Barrett, PA-C  TAZTIA XT 360 MG 24 hr capsule TAKE 1 CAPSULE BY MOUTH EVERY DAY 09/12/14   Belva Crome, MD   BP 131/63 mmHg  Pulse 63  Temp(Src) 98 F (36.7 C) (Oral)  Resp 20  Ht 6\' 1"  (1.854 m)  Wt 319 lb (144.697 kg)  BMI 42.10 kg/m2  SpO2 97% Physical Exam  Constitutional: He is oriented to person, place, and time. He appears well-developed.  HENT:  Head: Normocephalic and atraumatic.  Eyes: Conjunctivae and EOM are normal. Pupils are equal, round, and reactive to light.  Neck: Normal range of motion. Neck supple.  Cardiovascular: Normal rate and regular rhythm.   Pulmonary/Chest: Effort normal and breath sounds normal.  Abdominal: Soft. Bowel sounds are normal. He  exhibits no distension. There is no tenderness. There is no rebound and no guarding.  Neurological: He is alert and oriented to person, place, and time.  Skin: Skin is warm.  Nursing note and vitals reviewed.   ED Course  Procedures (including critical care time) Labs Review Labs Reviewed  URINALYSIS, ROUTINE W REFLEX MICROSCOPIC (NOT AT [facility]) - Abnormal; Notable for the following:    Color, Urine RED (*)    APPearance TURBID (*)    Hgb urine dipstick LARGE (*)    Bilirubin Urine MODERATE (*)    Ketones, ur 15 (*)    Protein, ur 100 (*)    Nitrite POSITIVE (*)    Leukocytes, UA MODERATE (*)    All other components within normal limits  URINE MICROSCOPIC-ADD ON - Abnormal; Notable for the following:    Squamous Epithelial / LPF 0-5 (*)    Bacteria, UA MANY (*)    All other components within normal limits  URINE CULTURE    Imaging Review No results found. I have personally reviewed and evaluated these images and lab results as part of my medical decision-making.   EKG Interpretation None      MDM   Final diagnoses:  UTI (lower urinary tract infection)    Pt comes in with some urinary urgency and suprapubic pain. Has a foley catheter in placed, which is also leaky. 20 Fr foley catheter was placed, and the urine was really dark and cloudy - thus UA ordered, and it shoes infection. Will tx with keflex and continue with the expectant f/u.    Varney Biles, MD 03/28/15 [phone]

## 2015-03-28 NOTE — ED Notes (Signed)
Patient is now drinking water to trial new cathater leakage.

## 2015-03-28 NOTE — ED Notes (Signed)
Reported patient status to Dr. Mariane Masters, urine was tea colored. Sample to be obtained, new cathater is placed.

## 2015-03-28 NOTE — ED Notes (Signed)
Teaching provided for cathater care, extra leg bags given. Patient reports no leaking noted but leg bag draining properly.

## 2015-03-31 ENCOUNTER — Ambulatory Visit (INDEPENDENT_AMBULATORY_CARE_PROVIDER_SITE_OTHER): Payer: Medicare Other | Admitting: Cardiology

## 2015-03-31 ENCOUNTER — Encounter: Payer: Self-pay | Admitting: Cardiology

## 2015-03-31 VITALS — BP 128/62 | HR 72 | Ht 73.0 in | Wt 305.0 lb

## 2015-03-31 DIAGNOSIS — I10 Essential (primary) hypertension: Secondary | ICD-10-CM

## 2015-03-31 DIAGNOSIS — R0609 Other forms of dyspnea: Secondary | ICD-10-CM | POA: Diagnosis not present

## 2015-03-31 DIAGNOSIS — Z01810 Encounter for preprocedural cardiovascular examination: Secondary | ICD-10-CM | POA: Diagnosis not present

## 2015-03-31 DIAGNOSIS — G4733 Obstructive sleep apnea (adult) (pediatric): Secondary | ICD-10-CM

## 2015-03-31 DIAGNOSIS — R0602 Shortness of breath: Secondary | ICD-10-CM

## 2015-03-31 LAB — URINE CULTURE
Culture: >=100000
SPECIAL REQUESTS: NORMAL

## 2015-03-31 NOTE — Progress Notes (Signed)
Cardiology Office Note   Date:  03/31/2015   ID:  Benjamin Shepard, DOB February 12, 1946, MRN [id number]  PCP:  Velna Hatchet, MD  Cardiologist:  Dr. Tamala Julian     Chief Complaint  Patient presents with  . Pre-op Exam    surgical clearance//pt c/o SOB on exertion, occasional swelling in bilateral feet, ankles (LASIX helps), UTI--enlarged prostate, urinary catheter.      History of Present Illness: Benjamin Shepard is a 69 y.o. male who presents for cardiac risk stratification for prostatectomy.  Has enlarged prostate and urinary retention.  He has no hx of CAD. He does have hypertension,  obstructive sleep apnea and diabetes.  + FH of CAD.    Today he denies chest pain but has developed DOE.  While walking quickly or doing strenuous activity.  His diabetes is controlled and previous lower ext edema has resolved.  He is down 38 lbs but he does not believe he has been trying.  His BP is well controlled today.    Past Medical History  Diagnosis Date  . HTN (hypertension)   . Edema   . OSA (obstructive sleep apnea)   . Diabetes ([place])   . Gout   . Hyperlipidemia     Past Surgical History  Procedure Laterality Date  . No prior surgery       Current Outpatient Prescriptions  Medication Sig Dispense Refill  . allopurinol (ZYLOPRIM) 100 MG tablet Take 100 mg by mouth daily.    Marland Kitchen amLODipine (NORVASC) 5 MG tablet Take 5 mg by mouth daily.    Marland Kitchen aspirin 81 MG tablet Take 81 mg by mouth daily.    Marland Kitchen atorvastatin (LIPITOR) 20 MG tablet Take 20 mg by mouth daily.    . cephALEXin (KEFLEX) 500 MG capsule Take 1 capsule (500 mg total) by mouth 2 (two) times daily. 14 capsule 0  . colchicine 0.6 MG tablet Take 0.6 mg by mouth as needed (gout).     Marland Kitchen doxazosin (CARDURA) 8 MG tablet TAKE 2 TABLETS BY MOUTH EVERY DAY 60 tablet 10  . finasteride (PROSCAR) 5 MG tablet Take 1 tablet by mouth daily.  5  . furosemide (LASIX) 20 MG tablet Take 20 mg by mouth daily as needed (leg swelling).    Marland Kitchen  losartan-hydrochlorothiazide (HYZAAR) 100-25 MG per tablet Take 1 tablet by mouth daily. 30 tablet 11  . metFORMIN (GLUCOPHAGE) 500 MG tablet Take 500 mg by mouth 2 (two) times daily with a meal.    . TAZTIA XT 360 MG 24 hr capsule TAKE 1 CAPSULE BY MOUTH EVERY DAY 30 capsule 10   No current facility-administered medications for this visit.    Allergies:   Other    Social History:  The patient  reports that he quit smoking about 17 years ago. His smoking use included Cigarettes. He has never used smokeless tobacco. He reports that he drinks alcohol. He reports that he does not use illicit drugs.   Family History:  The patient's family history includes Heart Problems in his mother; Heart disease in his brother, brother, and mother; Heart failure in his mother; Hypertension in his father. There is no history of Colon cancer.    ROS:  General:no colds or fevers, down 38 pounds since April. He has been active caring for his wife. Skin:no rashes or ulcers HEENT:no blurred vision, no congestion CV:see HPI PUL:see HPI GI:no diarrhea constipation or melena, no indigestion GU:+ hematuria, no dysuria, + urinary retention. MS:no joint pain, no claudication Neuro:no  syncope, no lightheadedness Endo:+ diabetes-controlled, no thyroid disease  Wt Readings from Last 3 Encounters:  03/31/15 305 lb (138.347 kg)  03/27/15 319 lb (144.697 kg)  03/03/15 319 lb 4 oz (144.811 kg)     PHYSICAL EXAM: VS:  BP 128/62 mmHg  Pulse 72  Ht 6\' 1"  (1.854 m)  Wt 305 lb (138.347 kg)  BMI 40.25 kg/m2 , BMI Body mass index is 40.25 kg/(m^2). General:Pleasant affect, NAD Skin:Warm and dry, brisk capillary refill HEENT:normocephalic, sclera clear, mucus membranes moist Neck:supple, no JVD, no bruits  Heart:S1S2 RRR without murmur, gallup, rub or click Lungs:clear without rales, rhonchi, or wheezes [id number], soft, non tender, + BS, do not palpate liver spleen or masses Ext:no lower ext edema, 2+ pedal pulses,  2+ radial pulses Neuro:alert and oriented X 3, MAE, follows commands, + facial symmetry    EKG:  EKG is ordered today. The ekg ordered today demonstrates SR LAD, non specific ST abnormality but no changes from previous.    Recent Labs: No results found for requested labs within last 365 days.    Lipid Panel No results found for: CHOL, TRIG, HDL, CHOLHDL, VLDL, LDLCALC, LDLDIRECT     Other studies Reviewed: Additional studies/ records that were reviewed today include: no previous nuc study or Echo. .   ASSESSMENT AND PLAN:  1. 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  Surgery will be general anesthesia lasting 3-4 hours and prolonged head-down position with minimal to moderate blood loss. Pt with DOE that is fairly new even though he has had wt loss.  He is diabetic and  Hypertensive.  We will proceed with exercise myoview. If negative we will clear  - after Dr. Tamala Julian reviews.   2. DOE new clears with rest.    3. HTN BP well controlled  4. OSA with full mask CPAP    5. Lower ext edema currently resolved.     Current medicines are reviewed with the patient today.  The patient Has no concerns regarding medicines.  The following changes have been made:  See above Labs/ tests ordered today include:see above  Disposition:   FU:  see above  Signed, Isaiah Serge, NP  03/31/2015 2:15 PM    [facility] [place], [place], [place], [place] Phone: [phone]; Fax: [phone]

## 2015-03-31 NOTE — Patient Instructions (Signed)
Medication Instructions:  Your physician recommends that you continue on your current medications as directed. Please refer to the Current Medication list given to you today.   Labwork: NONE  Testing/Procedures: Your physician has requested that you have en exercise stress myoview. For further information please visit [email or web address]. Please follow instruction sheet, as given.   Follow-Up: AS NEEDED  Any Other Special Instructions Will Be Listed Below (If Applicable).  If you need a refill on your cardiac medications before your next appointment, please call your pharmacy.

## 2015-04-01 ENCOUNTER — Telehealth (HOSPITAL_COMMUNITY): Payer: Self-pay

## 2015-04-01 NOTE — Progress Notes (Signed)
ED Antimicrobial Stewardship Positive Culture Follow Up   Benjamin Shepard is an 69 y.o. male who presented to [facility] on 03/27/2015 with a chief complaint of  Chief Complaint  Patient presents with  . Urinary Retention    Recent Results (from the past 720 hour(s))  Urine culture     Status: None   Collection Time: 03/28/15 12:43 AM  Result Value Ref Range Status   Specimen Description URINE, CATHETERIZED  Final   Special Requests Normal  Final   Culture   Final    >=100,000 COLONIES/mL ENTEROCOCCUS SPECIES >=100,000 COLONIES/mL STAPHYLOCOCCUS SPECIES (COAGULASE NEGATIVE)    Report Status 03/31/2015 FINAL  Final   Organism ID, Bacteria ENTEROCOCCUS SPECIES  Final   Organism ID, Bacteria STAPHYLOCOCCUS SPECIES (COAGULASE NEGATIVE)  Final      Susceptibility   Enterococcus species - MIC*    AMPICILLIN <=2 SENSITIVE Sensitive     LEVOFLOXACIN 2 SENSITIVE Sensitive     NITROFURANTOIN <=16 SENSITIVE Sensitive     VANCOMYCIN 1 SENSITIVE Sensitive     * >=100,000 COLONIES/mL ENTEROCOCCUS SPECIES   Staphylococcus species (coagulase negative) - MIC*    CIPROFLOXACIN >=8 RESISTANT Resistant     GENTAMICIN <=0.5 SENSITIVE Sensitive     NITROFURANTOIN <=16 SENSITIVE Sensitive     OXACILLIN <=0.25 SENSITIVE Sensitive     TETRACYCLINE <=1 SENSITIVE Sensitive     VANCOMYCIN 1 SENSITIVE Sensitive     TRIMETH/SULFA 40 SENSITIVE Sensitive     CLINDAMYCIN <=0.25 SENSITIVE Sensitive     RIFAMPIN <=0.5 SENSITIVE Sensitive     Inducible Clindamycin NEGATIVE Sensitive     * >=100,000 COLONIES/mL STAPHYLOCOCCUS SPECIES (COAGULASE NEGATIVE)    [x]  Treated with Cephalexin, but does not cover one of the organisms growing  New antibiotic prescription: Stop Cephalexin.  Give Augmentin 875/125 mg PO BID x 7 days.  ED Provider: Alecia Lemming, PA-C  Cassie L. Nicole Kindred, PharmD PGY2 Infectious Diseases Pharmacy Resident Pager: [phone] 04/01/2015 10:44 AM

## 2015-04-01 NOTE — Telephone Encounter (Signed)
Post ED Visit - Positive Culture Follow-up: Chart Hand-off to ED Flow Manager  Culture assessed and recommendations reviewed by: []  Levester Fresh, Pharm.D., BCPS []  Heide Guile, Pharm.D., BCPS-AQ ID []  Alycia Rossetti, Pharm.D., BCPS []  [place], Pharm.D., BCPS, AAHIVP []  Legrand Como, Pharm .D., BCPS, AAHIVP [x]  Milus Glazier, Pharm.D. []  Dimitri Ped, Pharm.D.  Positive urine culture  []  Patient discharged without antimicrobial prescription and treatment is now indicated [x]  Organism is resistant to prescribed ED discharge antimicrobial []  Patient with positive blood cultures  Changes discussed with ED provider: Alvera Singh New antibiotic prescription augmentin 875/125mg  po bid x 7 days.  Attempting to contact pt  Ileene Musa 04/01/2015, 5:04 PM

## 2015-04-04 ENCOUNTER — Telehealth (HOSPITAL_COMMUNITY): Payer: Self-pay | Admitting: *Deleted

## 2015-04-04 NOTE — Telephone Encounter (Signed)
Patient given detailed instructions per Myocardial Perfusion Study Information Sheet for the test on 04/06/15 at 1230. Patient notified to arrive 15 minutes early and that it is imperative to arrive on time for appointment to keep from having the test rescheduled.  If you need to cancel or reschedule your appointment, please call the office within 24 hours of your appointment. Failure to do so may result in a cancellation of your appointment, and a $50 no show fee. Patient verbalized understanding.Corrin Sieling, Ranae [place]ms

## 2015-04-06 ENCOUNTER — Ambulatory Visit (HOSPITAL_COMMUNITY): Payer: Medicare Other | Attending: Cardiovascular Disease

## 2015-04-06 DIAGNOSIS — R9439 Abnormal result of other cardiovascular function study: Secondary | ICD-10-CM | POA: Insufficient documentation

## 2015-04-06 DIAGNOSIS — R0602 Shortness of breath: Secondary | ICD-10-CM | POA: Diagnosis not present

## 2015-04-06 DIAGNOSIS — E119 Type 2 diabetes mellitus without complications: Secondary | ICD-10-CM | POA: Insufficient documentation

## 2015-04-06 DIAGNOSIS — I10 Essential (primary) hypertension: Secondary | ICD-10-CM | POA: Diagnosis not present

## 2015-04-06 DIAGNOSIS — R0609 Other forms of dyspnea: Secondary | ICD-10-CM | POA: Insufficient documentation

## 2015-04-06 DIAGNOSIS — R338 Other retention of urine: Secondary | ICD-10-CM | POA: Diagnosis not present

## 2015-04-06 MED ORDER — REGADENOSON 0.4 MG/5ML IV SOLN
0.4000 mg | Freq: Once | INTRAVENOUS | Status: AC
  Administered 2015-04-06: 0.4 mg via INTRAVENOUS

## 2015-04-06 MED ORDER — TECHNETIUM TC 99M SESTAMIBI GENERIC - CARDIOLITE
32.8000 | Freq: Once | INTRAVENOUS | Status: AC | PRN
  Administered 2015-04-06: 32.8 via INTRAVENOUS

## 2015-04-07 ENCOUNTER — Ambulatory Visit (HOSPITAL_COMMUNITY): Payer: Medicare Other | Attending: Cardiovascular Disease

## 2015-04-07 LAB — MYOCARDIAL PERFUSION IMAGING
CHL CUP NUCLEAR SRS: 1
LVDIAVOL: 124 mL
LVSYSVOL: 35 mL
NUC STRESS TID: 0.83
Peak HR: 80 {beats}/min
RATE: 0.33
Rest HR: 75 {beats}/min
SDS: 7
SSS: 8

## 2015-04-07 MED ORDER — TECHNETIUM TC 99M SESTAMIBI GENERIC - CARDIOLITE
32.2000 | Freq: Once | INTRAVENOUS | Status: AC | PRN
  Administered 2015-04-07: 32.2 via INTRAVENOUS

## 2015-04-11 ENCOUNTER — Telehealth: Payer: Self-pay

## 2015-04-11 DIAGNOSIS — Z01812 Encounter for preprocedural laboratory examination: Secondary | ICD-10-CM

## 2015-04-11 NOTE — Telephone Encounter (Signed)
F/u  Pt returning Rn phone call./  

## 2015-04-11 NOTE — Telephone Encounter (Signed)
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 Needs Cor angio prior to clearance for Prostate surgery.

## 2015-04-11 NOTE — Telephone Encounter (Signed)
Pt aware of myoview results  and Dr.Smith's recommendations. 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 Needs Cor angio prior to clearance for Prostate surgery. Pt agreeable with proceeding with cath. Pt will come to the office on 1/11 for pre cath labs. Pt rqst cath be scheduled for Fri 1/13. Adv pt that I will call him to adv him on on the time. Pt pre-procedure instructions will be left at the front desk for him to pick up when he comes in for labs. Pt agreeable with plan and verbalized understanding.

## 2015-04-11 NOTE — Telephone Encounter (Signed)
Called to give pt myoview results and Dr.Smith's recommendation.Left message with pt wife for pt to call the office. 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 Needs Cor angio prior to clearance for Prostate surgery. lmtcb

## 2015-04-12 ENCOUNTER — Other Ambulatory Visit: Payer: Self-pay | Admitting: Interventional Cardiology

## 2015-04-12 ENCOUNTER — Other Ambulatory Visit (INDEPENDENT_AMBULATORY_CARE_PROVIDER_SITE_OTHER): Payer: Medicare Other | Admitting: *Deleted

## 2015-04-12 DIAGNOSIS — R9439 Abnormal result of other cardiovascular function study: Secondary | ICD-10-CM

## 2015-04-12 DIAGNOSIS — Z01812 Encounter for preprocedural laboratory examination: Secondary | ICD-10-CM

## 2015-04-12 DIAGNOSIS — R0602 Shortness of breath: Secondary | ICD-10-CM | POA: Diagnosis not present

## 2015-04-12 DIAGNOSIS — I10 Essential (primary) hypertension: Secondary | ICD-10-CM | POA: Diagnosis not present

## 2015-04-12 NOTE — Telephone Encounter (Signed)
Pt aware of his cardiac cath date and time 1/13 @10 :30am pt to arrive at [facility] at 8:30am. Pt adv to hold Metformin the day before and day of procedure adv him that he will be instructed at d/c when to resume. Pt verbalized understanding to instructions given Pt will pick up written instructions today, and come in for labs today. Pt will call the office if he has any questions or concern  Pt sts that he is scheduled to have a dynamic uro bladder test at his urologist office on 1/12. Adv him per Dr.Smith it is ok for him to proceed with that test.

## 2015-04-13 DIAGNOSIS — R39198 Other difficulties with micturition: Secondary | ICD-10-CM | POA: Diagnosis not present

## 2015-04-13 DIAGNOSIS — R338 Other retention of urine: Secondary | ICD-10-CM | POA: Diagnosis not present

## 2015-04-13 LAB — CBC WITH DIFFERENTIAL/PLATELET
Basophils Absolute: 0 10*3/uL (ref 0.0–0.1)
Basophils Relative: 0 % (ref 0–1)
EOS ABS: 0 10*3/uL (ref 0.0–0.7)
EOS PCT: 0 % (ref 0–5)
HCT: 39.3 % (ref 39.0–52.0)
Hemoglobin: 13.3 g/dL (ref 13.0–17.0)
LYMPHS ABS: 1.4 10*3/uL (ref 0.7–4.0)
Lymphocytes Relative: 17 % (ref 12–46)
MCH: 30.2 pg (ref 26.0–34.0)
MCHC: 33.8 g/dL (ref 30.0–36.0)
MCV: 89.1 fL (ref 78.0–100.0)
MONOS PCT: 6 % (ref 3–12)
MPV: 10.9 fL (ref 8.6–12.4)
Monocytes Absolute: 0.5 10*3/uL (ref 0.1–1.0)
Neutro Abs: 6.5 10*3/uL (ref 1.7–7.7)
Neutrophils Relative %: 77 % (ref 43–77)
PLATELETS: 226 10*3/uL (ref 150–400)
RBC: 4.41 MIL/uL (ref 4.22–5.81)
RDW: 14.1 % (ref 11.5–15.5)
WBC: 8.4 10*3/uL (ref 4.0–10.5)

## 2015-04-13 LAB — PROTIME-INR
INR: 1.07 (ref <1.50)
Prothrombin Time: 14 seconds (ref 11.6–15.2)

## 2015-04-13 LAB — BASIC METABOLIC PANEL
BUN: 14 mg/dL (ref 7–25)
CHLORIDE: 103 mmol/L (ref 98–110)
CO2: 28 mmol/L (ref 20–31)
CREATININE: 0.84 mg/dL (ref 0.70–1.18)
Calcium: 9.6 mg/dL (ref 8.6–10.3)
Glucose, Bld: 123 mg/dL — ABNORMAL HIGH (ref 65–99)
Potassium: 3.1 mmol/L — ABNORMAL LOW (ref 3.5–5.3)
Sodium: 140 mmol/L (ref 135–146)

## 2015-04-14 ENCOUNTER — Encounter (HOSPITAL_COMMUNITY)
Admission: RE | Disposition: A | Discharge status: Home / Self Care | Payer: Self-pay | Source: Ambulatory Visit | Attending: Interventional Cardiology

## 2015-04-14 ENCOUNTER — Ambulatory Visit (HOSPITAL_COMMUNITY)
Admission: RE | Admit: 2015-04-14 | Discharge: 2015-04-14 | Disposition: A | Discharge status: Home / Self Care | Payer: Medicare Other | Source: Ambulatory Visit | Attending: Interventional Cardiology | Admitting: Interventional Cardiology

## 2015-04-14 DIAGNOSIS — Z7984 Long term (current) use of oral hypoglycemic drugs: Secondary | ICD-10-CM | POA: Insufficient documentation

## 2015-04-14 DIAGNOSIS — Z87891 Personal history of nicotine dependence: Secondary | ICD-10-CM | POA: Diagnosis not present

## 2015-04-14 DIAGNOSIS — E119 Type 2 diabetes mellitus without complications: Secondary | ICD-10-CM | POA: Insufficient documentation

## 2015-04-14 DIAGNOSIS — G4733 Obstructive sleep apnea (adult) (pediatric): Secondary | ICD-10-CM | POA: Diagnosis present

## 2015-04-14 DIAGNOSIS — E785 Hyperlipidemia, unspecified: Secondary | ICD-10-CM | POA: Diagnosis not present

## 2015-04-14 DIAGNOSIS — Z7982 Long term (current) use of aspirin: Secondary | ICD-10-CM | POA: Insufficient documentation

## 2015-04-14 DIAGNOSIS — I10 Essential (primary) hypertension: Secondary | ICD-10-CM | POA: Diagnosis present

## 2015-04-14 DIAGNOSIS — M109 Gout, unspecified: Secondary | ICD-10-CM | POA: Insufficient documentation

## 2015-04-14 DIAGNOSIS — Z8249 Family history of ischemic heart disease and other diseases of the circulatory system: Secondary | ICD-10-CM | POA: Diagnosis not present

## 2015-04-14 DIAGNOSIS — R9439 Abnormal result of other cardiovascular function study: Secondary | ICD-10-CM | POA: Insufficient documentation

## 2015-04-14 DIAGNOSIS — R931 Abnormal findings on diagnostic imaging of heart and coronary circulation: Secondary | ICD-10-CM

## 2015-04-14 DIAGNOSIS — R0600 Dyspnea, unspecified: Secondary | ICD-10-CM | POA: Diagnosis not present

## 2015-04-14 HISTORY — PX: CARDIAC CATHETERIZATION: SHX172

## 2015-04-14 LAB — GLUCOSE, CAPILLARY
GLUCOSE-CAPILLARY: 103 mg/dL — AB (ref 65–99)
Glucose-Capillary: 115 mg/dL — ABNORMAL HIGH (ref 65–99)

## 2015-04-14 LAB — POTASSIUM: Potassium: 3.1 mmol/L — ABNORMAL LOW (ref 3.5–5.1)

## 2015-04-14 SURGERY — LEFT HEART CATH AND CORONARY ANGIOGRAPHY
Anesthesia: LOCAL

## 2015-04-14 MED ORDER — SODIUM CHLORIDE 0.9 % WEIGHT BASED INFUSION: 1.0000 mL/kg/h | INTRAVENOUS | Status: DC

## 2015-04-14 MED ORDER — SODIUM CHLORIDE 0.9 % IV SOLN: 250.0000 mL | INTRAVENOUS | Status: DC | PRN

## 2015-04-14 MED ORDER — FENTANYL CITRATE (PF) 100 MCG/2ML IJ SOLN
INTRAMUSCULAR | Status: AC
  Filled 2015-04-14: qty 2

## 2015-04-14 MED ORDER — POTASSIUM CHLORIDE CRYS ER 20 MEQ PO TBCR
40.0000 meq | EXTENDED_RELEASE_TABLET | ORAL | Status: DC
  Filled 2015-04-14: qty 2

## 2015-04-14 MED ORDER — VERAPAMIL HCL 2.5 MG/ML IV SOLN
INTRAVENOUS | Status: DC | PRN
  Administered 2015-04-14: 10 mL via INTRA_ARTERIAL

## 2015-04-14 MED ORDER — POTASSIUM CHLORIDE CRYS ER 20 MEQ PO TBCR
EXTENDED_RELEASE_TABLET | ORAL | Status: AC
  Administered 2015-04-14: 40 meq via ORAL
  Filled 2015-04-14: qty 2

## 2015-04-14 MED ORDER — LIDOCAINE HCL (PF) 1 % IJ SOLN
INTRAMUSCULAR | Status: DC | PRN
  Administered 2015-04-14: 13:00:00

## 2015-04-14 MED ORDER — ONDANSETRON HCL 4 MG/2ML IJ SOLN: 4.0000 mg | Freq: Four times a day (QID) | INTRAMUSCULAR | Status: DC | PRN

## 2015-04-14 MED ORDER — HEPARIN SODIUM (PORCINE) 1000 UNIT/ML IJ SOLN
INTRAMUSCULAR | Status: DC | PRN
  Administered 2015-04-14: 5000 [IU] via INTRAVENOUS

## 2015-04-14 MED ORDER — ACETAMINOPHEN 325 MG PO TABS: 650.0000 mg | ORAL_TABLET | ORAL | Status: DC | PRN

## 2015-04-14 MED ORDER — POTASSIUM CHLORIDE CRYS ER 20 MEQ PO TBCR
40.0000 meq | EXTENDED_RELEASE_TABLET | Freq: Once | ORAL | Status: AC
  Administered 2015-04-14: 40 meq via ORAL

## 2015-04-14 MED ORDER — SODIUM CHLORIDE 0.9 % IJ SOLN: 3.0000 mL | INTRAMUSCULAR | Status: DC | PRN

## 2015-04-14 MED ORDER — LIDOCAINE HCL (PF) 1 % IJ SOLN
INTRAMUSCULAR | Status: AC
  Filled 2015-04-14: qty 30

## 2015-04-14 MED ORDER — MIDAZOLAM HCL 2 MG/2ML IJ SOLN
INTRAMUSCULAR | Status: DC | PRN
  Administered 2015-04-14 (×2): 1 mg via INTRAVENOUS

## 2015-04-14 MED ORDER — VERAPAMIL HCL 2.5 MG/ML IV SOLN
INTRAVENOUS | Status: AC
  Filled 2015-04-14: qty 2

## 2015-04-14 MED ORDER — POTASSIUM CHLORIDE CRYS ER 20 MEQ PO TBCR
EXTENDED_RELEASE_TABLET | ORAL | Status: AC
  Administered 2015-04-14: 40 meq
  Filled 2015-04-14: qty 2

## 2015-04-14 MED ORDER — HEPARIN SODIUM (PORCINE) 1000 UNIT/ML IJ SOLN
INTRAMUSCULAR | Status: AC
  Filled 2015-04-14: qty 1

## 2015-04-14 MED ORDER — SODIUM CHLORIDE 0.9 % WEIGHT BASED INFUSION
3.0000 mL/kg/h | INTRAVENOUS | Status: DC
  Administered 2015-04-14: 3 mL/kg/h via INTRAVENOUS

## 2015-04-14 MED ORDER — MIDAZOLAM HCL 2 MG/2ML IJ SOLN
INTRAMUSCULAR | Status: AC
  Filled 2015-04-14: qty 2

## 2015-04-14 MED ORDER — FENTANYL CITRATE (PF) 100 MCG/2ML IJ SOLN
INTRAMUSCULAR | Status: DC | PRN
Start: 2015-04-14 — End: 2015-04-14
  Administered 2015-04-14 (×2): 50 ug via INTRAVENOUS

## 2015-04-14 MED ORDER — IOHEXOL 350 MG/ML SOLN
INTRAVENOUS | Status: DC | PRN
  Administered 2015-04-14: 90 mL via INTRAVENOUS

## 2015-04-14 MED ORDER — SODIUM CHLORIDE 0.9 % IJ SOLN: 3.0000 mL | Freq: Two times a day (BID) | INTRAMUSCULAR | Status: DC

## 2015-04-14 MED ORDER — ASPIRIN 81 MG PO CHEW: 81.0000 mg | CHEWABLE_TABLET | ORAL | Status: DC

## 2015-04-14 MED ORDER — HEPARIN (PORCINE) IN NACL 2-0.9 UNIT/ML-% IJ SOLN
INTRAMUSCULAR | Status: AC
  Filled 2015-04-14: qty 1000

## 2015-04-14 MED ORDER — POTASSIUM CHLORIDE 20 MEQ PO PACK: 40.0000 meq | PACK | ORAL | Status: DC

## 2015-04-14 SURGICAL SUPPLY — 8 items
CATH IMPULSE 5F ANG/FL3.5 (CATHETERS) ×2 IMPLANT
DEVICE RAD COMP TR BAND LRG (VASCULAR PRODUCTS) ×2 IMPLANT
GLIDESHEATH SLEND A-KIT 6F 22G (SHEATH) ×2 IMPLANT
KIT HEART LEFT (KITS) ×2 IMPLANT
PACK CARDIAC CATHETERIZATION (CUSTOM PROCEDURE TRAY) ×2 IMPLANT
TRANSDUCER W/STOPCOCK (MISCELLANEOUS) ×2 IMPLANT
TUBING CIL FLEX 10 FLL-RA (TUBING) ×2 IMPLANT
WIRE SAFE-T 1.5MM-J .035X260CM (WIRE) ×2 IMPLANT

## 2015-04-14 NOTE — Interval H&P Note (Signed)
Cath Lab Visit (complete for each Cath Lab visit)  Clinical Evaluation Leading to the Procedure:   ACS: No.  Non-ACS:    Anginal Classification: CCS III  Anti-ischemic medical therapy: Minimal Therapy (1 class of medications)  Non-Invasive Test Results: Low-risk stress test findings: cardiac mortality <1%/year  Prior CABG: No previous CABG      History and Physical Interval Note:  04/14/2015 12:42 PM  Cleda Daub  has presented today for surgery, with the diagnosis of abnormal nuclear dtress test  The various methods of treatment have been discussed with the patient and family. After consideration of risks, benefits and other options for treatment, the patient has consented to  Procedure(s): Left Heart Cath and Coronary Angiography (N/A) as a surgical intervention .  The patient's history has been reviewed, patient examined, no change in status, stable for surgery.  I have reviewed the patient's chart and labs.  Questions were answered to the patient's satisfaction.     [name] Grooms

## 2015-04-14 NOTE — H&P (View-Only) (Signed)
Cardiology Office Note   Date:  03/31/2015   ID:  Benjamin Shepard, DOB 02/27/1946, MRN [id number]  PCP:  Velna Hatchet, MD  Cardiologist:  Dr. Tamala Julian     Chief Complaint  Patient presents with  . Pre-op Exam    surgical clearance//pt c/o SOB on exertion, occasional swelling in bilateral feet, ankles (LASIX helps), UTI--enlarged prostate, urinary catheter.      History of Present Illness: Benjamin Shepard is a 70 y.o. male who presents for cardiac risk stratification for prostatectomy.  Has enlarged prostate and urinary retention.  He has no hx of CAD. He does have hypertension,  obstructive sleep apnea and diabetes.  + FH of CAD.    Today he denies chest pain but has developed DOE.  While walking quickly or doing strenuous activity.  His diabetes is controlled and previous lower ext edema has resolved.  He is down 38 lbs but he does not believe he has been trying.  His BP is well controlled today.    Past Medical History  Diagnosis Date  . HTN (hypertension)   . Edema   . OSA (obstructive sleep apnea)   . Diabetes ([place])   . Gout   . Hyperlipidemia     Past Surgical History  Procedure Laterality Date  . No prior surgery       Current Outpatient Prescriptions  Medication Sig Dispense Refill  . allopurinol (ZYLOPRIM) 100 MG tablet Take 100 mg by mouth daily.    Marland Kitchen amLODipine (NORVASC) 5 MG tablet Take 5 mg by mouth daily.    Marland Kitchen aspirin 81 MG tablet Take 81 mg by mouth daily.    Marland Kitchen atorvastatin (LIPITOR) 20 MG tablet Take 20 mg by mouth daily.    . cephALEXin (KEFLEX) 500 MG capsule Take 1 capsule (500 mg total) by mouth 2 (two) times daily. 14 capsule 0  . colchicine 0.6 MG tablet Take 0.6 mg by mouth as needed (gout).     Marland Kitchen doxazosin (CARDURA) 8 MG tablet TAKE 2 TABLETS BY MOUTH EVERY DAY 60 tablet 10  . finasteride (PROSCAR) 5 MG tablet Take 1 tablet by mouth daily.  5  . furosemide (LASIX) 20 MG tablet Take 20 mg by mouth daily as needed (leg swelling).    Marland Kitchen  losartan-hydrochlorothiazide (HYZAAR) 100-25 MG per tablet Take 1 tablet by mouth daily. 30 tablet 11  . metFORMIN (GLUCOPHAGE) 500 MG tablet Take 500 mg by mouth 2 (two) times daily with a meal.    . TAZTIA XT 360 MG 24 hr capsule TAKE 1 CAPSULE BY MOUTH EVERY DAY 30 capsule 10   No current facility-administered medications for this visit.    Allergies:   Other    Social History:  The patient  reports that he quit smoking about 17 years ago. His smoking use included Cigarettes. He has never used smokeless tobacco. He reports that he drinks alcohol. He reports that he does not use illicit drugs.   Family History:  The patient's family history includes Heart Problems in his mother; Heart disease in his brother, brother, and mother; Heart failure in his mother; Hypertension in his father. There is no history of Colon cancer.    ROS:  General:no colds or fevers, down 38 pounds since April. He has been active caring for his wife. Skin:no rashes or ulcers HEENT:no blurred vision, no congestion CV:see HPI PUL:see HPI GI:no diarrhea constipation or melena, no indigestion GU:+ hematuria, no dysuria, + urinary retention. MS:no joint pain, no claudication Neuro:no  syncope, no lightheadedness Endo:+ diabetes-controlled, no thyroid disease  Wt Readings from Last 3 Encounters:  03/31/15 305 lb (138.347 kg)  03/27/15 319 lb (144.697 kg)  03/03/15 319 lb 4 oz (144.811 kg)     PHYSICAL EXAM: VS:  BP 128/62 mmHg  Pulse 72  Ht 6\' 1"  (1.854 m)  Wt 305 lb (138.347 kg)  BMI 40.25 kg/m2 , BMI Body mass index is 40.25 kg/(m^2). General:Pleasant affect, NAD Skin:Warm and dry, brisk capillary refill HEENT:normocephalic, sclera clear, mucus membranes moist Neck:supple, no JVD, no bruits  Heart:S1S2 RRR without murmur, gallup, rub or click Lungs:clear without rales, rhonchi, or wheezes [id number], soft, non tender, + BS, do not palpate liver spleen or masses Ext:no lower ext edema, 2+ pedal pulses,  2+ radial pulses Neuro:alert and oriented X 3, MAE, follows commands, + facial symmetry    EKG:  EKG is ordered today. The ekg ordered today demonstrates SR LAD, non specific ST abnormality but no changes from previous.    Recent Labs: No results found for requested labs within last 365 days.    Lipid Panel No results found for: CHOL, TRIG, HDL, CHOLHDL, VLDL, LDLCALC, LDLDIRECT     Other studies Reviewed: Additional studies/ records that were reviewed today include: no previous nuc study or Echo. .   ASSESSMENT AND PLAN:  1. 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  Surgery will be general anesthesia lasting 3-4 hours and prolonged head-down position with minimal to moderate blood loss. Pt with DOE that is fairly new even though he has had wt loss.  He is diabetic and  Hypertensive.  We will proceed with exercise myoview. If negative we will clear  - after Dr. Tamala Julian reviews.   2. DOE new clears with rest.    3. HTN BP well controlled  4. OSA with full mask CPAP    5. Lower ext edema currently resolved.     Current medicines are reviewed with the patient today.  The patient Has no concerns regarding medicines.  The following changes have been made:  See above Labs/ tests ordered today include:see above  Disposition:   FU:  see above  Signed, Isaiah Serge, NP  03/31/2015 2:15 PM    [facility] [place], [place], [address] [place], [place] Phone: [phone]; Fax: [phone]

## 2015-04-14 NOTE — Discharge Instructions (Signed)
Radial Site Care °Refer to this sheet in the next few weeks. These instructions provide you with information about caring for yourself after your procedure. Your health care provider may also give you more specific instructions. Your treatment has been planned according to current medical practices, but problems sometimes occur. Call your health care provider if you have any problems or questions after your procedure. °WHAT TO EXPECT AFTER THE PROCEDURE °After your procedure, it is typical to have the following: °· Bruising at the radial site that usually fades within 1-2 weeks. °· Blood collecting in the tissue (hematoma) that may be painful to the touch. It should usually decrease in size and tenderness within 1-2 weeks. °HOME CARE INSTRUCTIONS °· Take medicines only as directed by your health care provider. °· You may shower 24-48 hours after the procedure or as directed by your health care provider. Remove the bandage (dressing) and gently wash the site with plain soap and water. Pat the area dry with a clean towel. Do not rub the site, because this may cause bleeding. °· Do not take baths, swim, or use a hot tub until your health care provider approves. °· Check your insertion site every day for redness, swelling, or drainage. °· Do not apply powder or lotion to the site. °· Do not flex or bend the affected arm for 24 hours or as directed by your health care provider. °· Do not push or pull heavy objects with the affected arm for 24 hours or as directed by your health care provider. °· Do not lift over 10 lb (4.5 kg) for 5 days after your procedure or as directed by your health care provider. °· Ask your health care provider when it is okay to: °¨ Return to work or school. °¨ Resume usual physical activities or sports. °¨ Resume sexual activity. °· Do not drive home if you are discharged the same day as the procedure. Have someone else drive you. °· You may drive 24 hours after the procedure unless otherwise  instructed by your health care provider. °· Do not operate machinery or power tools for 24 hours after the procedure. °· If your procedure was done as an outpatient procedure, which means that you went home the same day as your procedure, a responsible adult should be with you for the first 24 hours after you arrive home. °· Keep all follow-up visits as directed by your health care provider. This is important. °SEEK MEDICAL CARE IF: °· You have a fever. °· You have chills. °· You have increased bleeding from the radial site. Hold pressure on the site and call 911. °SEEK IMMEDIATE MEDICAL CARE IF: °· You have unusual pain at the radial site. °· You have redness, warmth, or swelling at the radial site. °· You have drainage (other than a small amount of blood on the dressing) from the radial site. °· The radial site is bleeding, and the bleeding does not stop after 30 minutes of holding steady pressure on the site. °· Your arm or hand becomes pale, cool, tingly, or numb. °  °This information is not intended to replace advice given to you by your health care provider. Make sure you discuss any questions you have with your health care provider. °  °Document Released: 04/20/2010 Document Revised: 04/08/2014 Document Reviewed: 10/04/2013 °Elsevier Interactive Patient Education ©2016 [employer] ° °

## 2015-04-14 NOTE — Research (Signed)
CADLAD Informed Consent   Subject Name: Benjamin Shepard  Subject met inclusion and exclusion criteria.  The informed consent form, study requirements and expectations were reviewed with the subject and questions and concerns were addressed prior to the signing of the consent form.  The subject verbalized understanding of the trial requirements.  The subject agreed to participate in the CADLAD trial and signed the informed consent.  The informed consent was obtained prior to performance of any protocol-specific procedures for the subject.  A copy of the signed informed consent was given to the subject and a copy was placed in the subject's medical record.  [name] 04/14/2015, 2:32 PM

## 2015-04-17 ENCOUNTER — Encounter (HOSPITAL_COMMUNITY): Payer: Self-pay | Admitting: Interventional Cardiology

## 2015-04-18 DIAGNOSIS — N401 Enlarged prostate with lower urinary tract symptoms: Secondary | ICD-10-CM | POA: Diagnosis not present

## 2015-04-18 DIAGNOSIS — R338 Other retention of urine: Secondary | ICD-10-CM | POA: Diagnosis not present

## 2015-04-19 ENCOUNTER — Other Ambulatory Visit: Payer: Self-pay | Admitting: Urology

## 2015-04-29 ENCOUNTER — Telehealth (HOSPITAL_COMMUNITY): Payer: Self-pay

## 2015-04-29 NOTE — Telephone Encounter (Signed)
Unable to contact pt by mail or telephone. Unable to communicate lab results or treatment changes. 

## 2015-05-18 NOTE — Patient Instructions (Addendum)
YOUR PROCEDURE IS SCHEDULED ON :  05/31/15  REPORT TO [facility] MAIN ENTRANCE FOLLOW SIGNS TO EAST ELEVATOR - GO TO 3rd FLOOR CHECK IN AT 3 EAST NURSES STATION (SHORT STAY) AT:  6:30 AM  CALL THIS NUMBER IF YOU HAVE PROBLEMS THE MORNING OF SURGERY [phone]  REMEMBER:ONLY 1 PER PERSON MAY GO TO SHORT STAY WITH YOU TO GET READY THE MORNING OF YOUR SURGERY  DO NOT EAT FOOD OR DRINK LIQUIDS AFTER MIDNIGHT  TAKE THESE MEDICINES THE MORNING OF SURGERY:ALLOPURINOL / [place] / TAZTIA  BRING C-PAP TUBING AND MASK TO HOSPITAL  YOU MAY NOT HAVE ANY METAL ON YOUR BODY INCLUDING HAIR PINS AND PIERCING'S. DO NOT WEAR JEWELRY, MAKEUP, LOTIONS, POWDERS OR PERFUMES. DO NOT WEAR NAIL POLISH. DO NOT SHAVE 48 HRS PRIOR TO SURGERY. MEN MAY SHAVE FACE AND NECK.  DO NOT [place]. [place] IS NOT RESPONSIBLE FOR VALUABLES.  CONTACTS, DENTURES OR PARTIALS MAY NOT BE WORN TO SURGERY. LEAVE SUITCASE IN CAR. CAN BE BROUGHT TO ROOM AFTER SURGERY.  PATIENTS DISCHARGED THE DAY OF SURGERY WILL NOT BE ALLOWED TO DRIVE HOME.  PLEASE READ OVER THE FOLLOWING INSTRUCTION SHEETS _________________________________________________________________________________                                          [place] - PREPARING FOR SURGERY  Before surgery, you can play an important role.  Because skin is not sterile, your skin needs to be as free of germs as possible.  You can reduce the number of germs on your skin by washing with CHG (chlorahexidine gluconate) soap before surgery.  CHG is an antiseptic cleaner which kills germs and bonds with the skin to continue killing germs even after washing. Please DO NOT use if you have an allergy to CHG or antibacterial soaps.  If your skin becomes reddened/irritated stop using the CHG and inform your nurse when you arrive at Short Stay. Do not shave (including legs and underarms) for at least 48 hours prior to the first CHG shower.   You may shave your face. Please follow these instructions carefully:   1.  Shower with CHG Soap the night before surgery and the  morning of Surgery.   2.  If you choose to wash your hair, wash your hair first as usual with your  normal  Shampoo.   3.  After you shampoo, rinse your hair and body thoroughly to remove the  shampoo.                                         4.  Use CHG as you would any other liquid soap.  You can apply chg directly  to the skin and wash . Gently wash with scrungie or clean wascloth    5.  Apply the CHG Soap to your body ONLY FROM THE NECK DOWN.   Do not use on open                           Wound or open sores. Avoid contact with eyes, ears mouth and genitals (private parts).  Genitals (private parts) with your normal soap.              6.  Wash thoroughly, paying special attention to the area where your surgery  will be performed.   7.  Thoroughly rinse your body with warm water from the neck down.   8.  DO NOT shower/wash with your normal soap after using and rinsing off  the CHG Soap .                9.  Pat yourself dry with a clean towel.             10.  Wear clean night clothes to bed after shower             11.  Place clean sheets on your bed the night of your first shower and do not  sleep with pets.  Day of Surgery : Do not apply any lotions/deodorants the morning of surgery.  Please wear clean clothes to the hospital/surgery center.  FAILURE TO FOLLOW THESE INSTRUCTIONS MAY RESULT IN THE CANCELLATION OF YOUR SURGERY    PATIENT SIGNATURE_________________________________  ______________________________________________________________________

## 2015-05-22 ENCOUNTER — Encounter (HOSPITAL_COMMUNITY): Payer: Self-pay

## 2015-05-22 ENCOUNTER — Encounter (HOSPITAL_COMMUNITY)
Admission: RE | Admit: 2015-05-22 | Discharge: 2015-05-22 | Disposition: A | Discharge status: Home / Self Care | Payer: Medicare Other | Source: Ambulatory Visit | Attending: Urology | Admitting: Urology

## 2015-05-22 DIAGNOSIS — Z7984 Long term (current) use of oral hypoglycemic drugs: Secondary | ICD-10-CM | POA: Insufficient documentation

## 2015-05-22 DIAGNOSIS — Z79899 Other long term (current) drug therapy: Secondary | ICD-10-CM | POA: Diagnosis not present

## 2015-05-22 DIAGNOSIS — Z01812 Encounter for preprocedural laboratory examination: Secondary | ICD-10-CM | POA: Diagnosis not present

## 2015-05-22 DIAGNOSIS — Z0183 Encounter for blood typing: Secondary | ICD-10-CM | POA: Diagnosis not present

## 2015-05-22 DIAGNOSIS — E119 Type 2 diabetes mellitus without complications: Secondary | ICD-10-CM | POA: Insufficient documentation

## 2015-05-22 DIAGNOSIS — N400 Enlarged prostate without lower urinary tract symptoms: Secondary | ICD-10-CM | POA: Insufficient documentation

## 2015-05-22 HISTORY — DX: Psoriasis, unspecified: L40.9

## 2015-05-22 HISTORY — DX: Benign prostatic hyperplasia without lower urinary tract symptoms: N40.0

## 2015-05-22 HISTORY — DX: Retention of urine, unspecified: R33.9

## 2015-05-22 HISTORY — DX: Reserved for inherently not codable concepts without codable children: IMO0001

## 2015-05-22 HISTORY — DX: Presence of urogenital implants: Z96.0

## 2015-05-22 HISTORY — DX: Presence of other specified devices: Z97.8

## 2015-05-22 LAB — BASIC METABOLIC PANEL
Anion gap: 12 (ref 5–15)
BUN: 14 mg/dL (ref 6–20)
CALCIUM: 9.4 mg/dL (ref 8.9–10.3)
CO2: 24 mmol/L (ref 22–32)
CREATININE: 0.91 mg/dL (ref 0.61–1.24)
Chloride: 103 mmol/L (ref 101–111)
GFR calc non Af Amer: >60 mL/min (ref >60)
Glucose, Bld: 122 mg/dL — ABNORMAL HIGH (ref 65–99)
Potassium: 3.2 mmol/L — ABNORMAL LOW (ref 3.5–5.1)
SODIUM: 139 mmol/L (ref 135–145)

## 2015-05-22 LAB — CBC
HCT: 41.7 % (ref 39.0–52.0)
Hemoglobin: 13.5 g/dL (ref 13.0–17.0)
MCH: 30.2 pg (ref 26.0–34.0)
MCHC: 32.4 g/dL (ref 30.0–36.0)
MCV: 93.3 fL (ref 78.0–100.0)
PLATELETS: 203 10*3/uL (ref 150–400)
RBC: 4.47 MIL/uL (ref 4.22–5.81)
RDW: 14.3 % (ref 11.5–15.5)
WBC: 7.4 10*3/uL (ref 4.0–10.5)

## 2015-05-23 LAB — HEMOGLOBIN A1C
Hgb A1c MFr Bld: 5.9 % — ABNORMAL HIGH (ref 4.8–5.6)
Mean Plasma Glucose: 123 mg/dL

## 2015-05-23 LAB — ABO/RH: ABO/RH(D): A POS

## 2015-05-27 NOTE — Anesthesia Preprocedure Evaluation (Addendum)
Anesthesia Evaluation  Patient identified by MRN, date of birth, ID band Patient awake    Reviewed: Allergy & Precautions, NPO status , Patient's Chart, lab work & pertinent test results  Airway Mallampati: III  TM Distance: >3 FB Neck ROM: Full    Dental  (+) Dental Advisory Given, Teeth Intact   Pulmonary neg pulmonary ROS, shortness of breath, sleep apnea and Continuous Positive Airway Pressure Ventilation , former smoker (quit 1999),    Pulmonary exam normal        Cardiovascular hypertension, Pt. on medications negative cardio ROS Normal cardiovascular exam  CATH 04/14/2015 LVF normal, coronaries normal, cleared by cardiology   Neuro/Psych negative neurological ROS  negative psych ROS   GI/Hepatic negative GI ROS, Neg liver ROS,   Endo/Other  diabetes, Type 2, Oral Hypoglycemic AgentsMorbid obesity  Renal/GU negative Renal ROS   Prostatic Hypertrophy negative genitourinary   Musculoskeletal negative musculoskeletal ROS (+)   Abdominal (+) + obese,   Peds negative pediatric ROS (+)  Hematology negative hematology ROS (+) 13/42   Anesthesia Other Findings   Reproductive/Obstetrics negative OB ROS                           Anesthesia Physical Anesthesia Plan  ASA: III  Anesthesia Plan: General   Post-op Pain Management:    Induction: Intravenous  Airway Management Planned: Oral ETT  Additional Equipment:   Intra-op Plan:   Post-operative Plan: Extubation in OR  Informed Consent: I have reviewed the patients History and Physical, chart, labs and discussed the procedure including the risks, benefits and alternatives for the proposed anesthesia with the patient or authorized representative who has indicated his/her understanding and acceptance.     Plan Discussed with:   Anesthesia Plan Comments: (2nd IV be nice, Potasium runs low 3.1-3.2,)        Anesthesia Quick  Evaluation

## 2015-05-30 MED ORDER — GENTAMICIN SULFATE 40 MG/ML IJ SOLN
500.0000 mg | Freq: Once | INTRAVENOUS | Status: AC
  Administered 2015-05-31: 500 mg via INTRAVENOUS
  Filled 2015-05-30: qty 12.5

## 2015-05-30 MED ORDER — GENTAMICIN SULFATE 40 MG/ML IJ SOLN
500.0000 mg | INTRAVENOUS | Status: DC
  Filled 2015-05-30: qty 12.5

## 2015-05-31 ENCOUNTER — Inpatient Hospital Stay (HOSPITAL_COMMUNITY): Payer: Medicare Other | Admitting: Anesthesiology

## 2015-05-31 ENCOUNTER — Inpatient Hospital Stay (HOSPITAL_COMMUNITY)
Admission: RE | Admit: 2015-05-31 | Discharge: 2015-06-02 | DRG: 708 | Disposition: A | Discharge status: Home / Self Care | Payer: Medicare Other | Source: Ambulatory Visit | Attending: Urology | Admitting: Urology

## 2015-05-31 ENCOUNTER — Encounter (HOSPITAL_COMMUNITY): Payer: Self-pay | Admitting: Anesthesiology

## 2015-05-31 ENCOUNTER — Encounter (HOSPITAL_COMMUNITY)
Admission: RE | Disposition: A | Discharge status: Home / Self Care | Payer: Self-pay | Source: Ambulatory Visit | Attending: Urology

## 2015-05-31 DIAGNOSIS — E785 Hyperlipidemia, unspecified: Secondary | ICD-10-CM | POA: Diagnosis present

## 2015-05-31 DIAGNOSIS — N401 Enlarged prostate with lower urinary tract symptoms: Secondary | ICD-10-CM | POA: Diagnosis not present

## 2015-05-31 DIAGNOSIS — Z79899 Other long term (current) drug therapy: Secondary | ICD-10-CM

## 2015-05-31 DIAGNOSIS — G4733 Obstructive sleep apnea (adult) (pediatric): Secondary | ICD-10-CM | POA: Diagnosis present

## 2015-05-31 DIAGNOSIS — K4020 Bilateral inguinal hernia, without obstruction or gangrene, not specified as recurrent: Secondary | ICD-10-CM | POA: Diagnosis present

## 2015-05-31 DIAGNOSIS — E119 Type 2 diabetes mellitus without complications: Secondary | ICD-10-CM | POA: Diagnosis present

## 2015-05-31 DIAGNOSIS — I10 Essential (primary) hypertension: Secondary | ICD-10-CM | POA: Diagnosis not present

## 2015-05-31 DIAGNOSIS — Z7982 Long term (current) use of aspirin: Secondary | ICD-10-CM

## 2015-05-31 DIAGNOSIS — Z91013 Allergy to seafood: Secondary | ICD-10-CM

## 2015-05-31 DIAGNOSIS — Z888 Allergy status to other drugs, medicaments and biological substances status: Secondary | ICD-10-CM

## 2015-05-31 DIAGNOSIS — R338 Other retention of urine: Secondary | ICD-10-CM | POA: Diagnosis present

## 2015-05-31 DIAGNOSIS — Z7984 Long term (current) use of oral hypoglycemic drugs: Secondary | ICD-10-CM | POA: Diagnosis not present

## 2015-05-31 DIAGNOSIS — Z6838 Body mass index (BMI) 38.0-38.9, adult: Secondary | ICD-10-CM

## 2015-05-31 DIAGNOSIS — M109 Gout, unspecified: Secondary | ICD-10-CM | POA: Diagnosis present

## 2015-05-31 DIAGNOSIS — R3129 Other microscopic hematuria: Secondary | ICD-10-CM | POA: Diagnosis not present

## 2015-05-31 DIAGNOSIS — N138 Other obstructive and reflux uropathy: Secondary | ICD-10-CM | POA: Diagnosis present

## 2015-05-31 DIAGNOSIS — E669 Obesity, unspecified: Secondary | ICD-10-CM | POA: Diagnosis present

## 2015-05-31 DIAGNOSIS — N400 Enlarged prostate without lower urinary tract symptoms: Secondary | ICD-10-CM | POA: Diagnosis not present

## 2015-05-31 HISTORY — PX: XI ROBOTIC ASSISTED SIMPLE PROSTATECTOMY: SHX6713

## 2015-05-31 HISTORY — PX: ROBOT ASSISTED INGUINAL HERNIA REPAIR: SHX6561

## 2015-05-31 LAB — TYPE AND SCREEN
ABO/RH(D): A POS
Antibody Screen: NEGATIVE

## 2015-05-31 LAB — GLUCOSE, CAPILLARY
GLUCOSE-CAPILLARY: 99 mg/dL (ref 65–99)
GLUCOSE-CAPILLARY: 152 mg/dL — AB (ref 65–99)
GLUCOSE-CAPILLARY: 164 mg/dL — AB (ref 65–99)
Glucose-Capillary: 100 mg/dL — ABNORMAL HIGH (ref 65–99)

## 2015-05-31 LAB — HEMOGLOBIN AND HEMATOCRIT, BLOOD
HCT: 41 % (ref 39.0–52.0)
Hemoglobin: 13.1 g/dL (ref 13.0–17.0)

## 2015-05-31 SURGERY — PROSTATECTOMY, SIMPLE, ROBOT-ASSISTED
Anesthesia: General

## 2015-05-31 MED ORDER — LOSARTAN POTASSIUM-HCTZ 100-25 MG PO TABS: 1.0000 | ORAL_TABLET | Freq: Every day | ORAL | Status: DC

## 2015-05-31 MED ORDER — OXYCODONE HCL 5 MG PO TABS
5.0000 mg | ORAL_TABLET | ORAL | Status: DC | PRN
  Administered 2015-06-01 – 2015-06-02 (×2): 5 mg via ORAL
  Filled 2015-05-31 (×2): qty 1

## 2015-05-31 MED ORDER — DOXAZOSIN MESYLATE 4 MG PO TABS
16.0000 mg | ORAL_TABLET | Freq: Every day | ORAL | Status: DC
  Administered 2015-05-31 – 2015-06-02 (×3): 16 mg via ORAL
  Filled 2015-05-31 (×3): qty 4

## 2015-05-31 MED ORDER — METFORMIN HCL 500 MG PO TABS
500.0000 mg | ORAL_TABLET | Freq: Two times a day (BID) | ORAL | Status: DC
  Administered 2015-06-01 – 2015-06-02 (×3): 500 mg via ORAL
  Filled 2015-05-31 (×3): qty 1

## 2015-05-31 MED ORDER — AMLODIPINE BESYLATE 5 MG PO TABS
5.0000 mg | ORAL_TABLET | Freq: Every day | ORAL | Status: DC
  Administered 2015-05-31 – 2015-06-01 (×2): 5 mg via ORAL
  Filled 2015-05-31 (×3): qty 1

## 2015-05-31 MED ORDER — PROPOFOL 10 MG/ML IV BOLUS
INTRAVENOUS | Status: DC | PRN
  Administered 2015-05-31: 200 mg via INTRAVENOUS

## 2015-05-31 MED ORDER — FENTANYL CITRATE (PF) 100 MCG/2ML IJ SOLN
INTRAMUSCULAR | Status: DC | PRN
  Administered 2015-05-31 (×2): 50 ug via INTRAVENOUS
  Administered 2015-05-31: 100 ug via INTRAVENOUS
  Administered 2015-05-31: 50 ug via INTRAVENOUS

## 2015-05-31 MED ORDER — MEPERIDINE HCL 50 MG/ML IJ SOLN
6.2500 mg | INTRAMUSCULAR | Status: DC | PRN
Start: 2015-05-31 — End: 2015-05-31

## 2015-05-31 MED ORDER — HYDROCHLOROTHIAZIDE 25 MG PO TABS
25.0000 mg | ORAL_TABLET | Freq: Every day | ORAL | Status: DC
  Administered 2015-05-31 – 2015-06-02 (×3): 25 mg via ORAL
  Filled 2015-05-31 (×3): qty 1

## 2015-05-31 MED ORDER — SODIUM CHLORIDE 0.45 % IV SOLN
INTRAVENOUS | Status: DC
Start: 2015-05-31 — End: 2015-06-01
  Administered 2015-05-31: 125 mL/h via INTRAVENOUS
  Administered 2015-06-01: 02:00:00 via INTRAVENOUS

## 2015-05-31 MED ORDER — SODIUM CHLORIDE 0.9 % IV BOLUS (SEPSIS)
1000.0000 mL | Freq: Once | INTRAVENOUS | Status: AC
  Administered 2015-05-31: 1000 mL via INTRAVENOUS

## 2015-05-31 MED ORDER — ACETAMINOPHEN 500 MG PO TABS
1000.0000 mg | ORAL_TABLET | Freq: Four times a day (QID) | ORAL | Status: AC
Start: 2015-05-31 — End: 2015-06-01
  Administered 2015-05-31 – 2015-06-01 (×4): 1000 mg via ORAL
  Filled 2015-05-31 (×4): qty 2

## 2015-05-31 MED ORDER — SODIUM CHLORIDE 0.9 % IJ SOLN
INTRAMUSCULAR | Status: AC
  Filled 2015-05-31: qty 10

## 2015-05-31 MED ORDER — PROMETHAZINE HCL 25 MG/ML IJ SOLN: 6.2500 mg | INTRAMUSCULAR | Status: DC | PRN

## 2015-05-31 MED ORDER — DEXTROSE-NACL 5-0.45 % IV SOLN
INTRAVENOUS | Status: DC
Start: 2015-05-31 — End: 2015-05-31
  Administered 2015-05-31: 14:00:00 via INTRAVENOUS

## 2015-05-31 MED ORDER — FENTANYL CITRATE (PF) 100 MCG/2ML IJ SOLN
INTRAMUSCULAR | Status: AC
  Filled 2015-05-31: qty 2

## 2015-05-31 MED ORDER — ROCURONIUM BROMIDE 100 MG/10ML IV SOLN
INTRAVENOUS | Status: DC | PRN
  Administered 2015-05-31: 10 mg via INTRAVENOUS
  Administered 2015-05-31: 20 mg via INTRAVENOUS
  Administered 2015-05-31: 10 mg via INTRAVENOUS
  Administered 2015-05-31: 50 mg via INTRAVENOUS

## 2015-05-31 MED ORDER — ONDANSETRON HCL 4 MG/2ML IJ SOLN
INTRAMUSCULAR | Status: DC | PRN
  Administered 2015-05-31: 4 mg via INTRAVENOUS

## 2015-05-31 MED ORDER — BUPIVACAINE LIPOSOME 1.3 % IJ SUSP
20.0000 mL | Freq: Once | INTRAMUSCULAR | Status: AC
  Administered 2015-05-31: 20 mL
  Filled 2015-05-31: qty 20

## 2015-05-31 MED ORDER — COLCHICINE 0.6 MG PO TABS: 0.6000 mg | ORAL_TABLET | Freq: Every day | ORAL | Status: DC | PRN

## 2015-05-31 MED ORDER — SODIUM CHLORIDE 0.9 % IJ SOLN
INTRAMUSCULAR | Status: AC
  Filled 2015-05-31: qty 20

## 2015-05-31 MED ORDER — LOSARTAN POTASSIUM 50 MG PO TABS
100.0000 mg | ORAL_TABLET | Freq: Every day | ORAL | Status: DC
  Administered 2015-05-31 – 2015-06-02 (×3): 100 mg via ORAL
  Filled 2015-05-31 (×3): qty 2

## 2015-05-31 MED ORDER — STERILE WATER FOR IRRIGATION IR SOLN
Status: DC | PRN
  Administered 2015-05-31: 1000 mL

## 2015-05-31 MED ORDER — DILTIAZEM HCL ER COATED BEADS 180 MG PO CP24
360.0000 mg | ORAL_CAPSULE | Freq: Every day | ORAL | Status: DC
  Administered 2015-06-01 – 2015-06-02 (×2): 360 mg via ORAL
  Filled 2015-05-31 (×2): qty 2

## 2015-05-31 MED ORDER — SUGAMMADEX SODIUM 500 MG/5ML IV SOLN
INTRAVENOUS | Status: DC | PRN
  Administered 2015-05-31: 270 mg via INTRAVENOUS

## 2015-05-31 MED ORDER — ONDANSETRON HCL 4 MG/2ML IJ SOLN: 4.0000 mg | INTRAMUSCULAR | Status: DC | PRN

## 2015-05-31 MED ORDER — BELLADONNA ALKALOIDS-OPIUM 16.2-60 MG RE SUPP
1.0000 | Freq: Four times a day (QID) | RECTAL | Status: DC | PRN
  Administered 2015-05-31: 1 via RECTAL
  Filled 2015-05-31: qty 1

## 2015-05-31 MED ORDER — LACTATED RINGERS IR SOLN
Status: DC | PRN
  Administered 2015-05-31: 1000 mL

## 2015-05-31 MED ORDER — DILTIAZEM HCL ER BEADS 240 MG PO CP24: 360.0000 mg | ORAL_CAPSULE | Freq: Every day | ORAL | Status: DC

## 2015-05-31 MED ORDER — LACTATED RINGERS IV SOLN
INTRAVENOUS | Status: DC | PRN
  Administered 2015-05-31 (×2): via INTRAVENOUS

## 2015-05-31 MED ORDER — SULFAMETHOXAZOLE-TRIMETHOPRIM 800-160 MG PO TABS: 1.0000 | ORAL_TABLET | Freq: Two times a day (BID) | ORAL | Status: DC

## 2015-05-31 MED ORDER — HYDROMORPHONE HCL 1 MG/ML IJ SOLN
0.5000 mg | INTRAMUSCULAR | Status: DC | PRN
  Administered 2015-05-31: 1 mg via INTRAVENOUS
  Filled 2015-05-31: qty 1

## 2015-05-31 MED ORDER — HYDROCODONE-ACETAMINOPHEN 5-325 MG PO TABS: 1.0000 | ORAL_TABLET | Freq: Four times a day (QID) | ORAL | Status: DC | PRN

## 2015-05-31 MED ORDER — DIPHENHYDRAMINE HCL 50 MG/ML IJ SOLN: 12.5000 mg | Freq: Four times a day (QID) | INTRAMUSCULAR | Status: DC | PRN

## 2015-05-31 MED ORDER — LIDOCAINE HCL (CARDIAC) 20 MG/ML IV SOLN
INTRAVENOUS | Status: DC | PRN
  Administered 2015-05-31: 50 mg via INTRAVENOUS

## 2015-05-31 MED ORDER — ALLOPURINOL 100 MG PO TABS
100.0000 mg | ORAL_TABLET | Freq: Every day | ORAL | Status: DC
  Administered 2015-06-01 – 2015-06-02 (×2): 100 mg via ORAL
  Filled 2015-05-31 (×2): qty 1

## 2015-05-31 MED ORDER — ATORVASTATIN CALCIUM 10 MG PO TABS
20.0000 mg | ORAL_TABLET | Freq: Every day | ORAL | Status: DC
Start: 2015-05-31 — End: 2015-06-02
  Administered 2015-05-31 – 2015-06-02 (×3): 20 mg via ORAL
  Filled 2015-05-31 (×3): qty 2

## 2015-05-31 MED ORDER — FENTANYL CITRATE (PF) 250 MCG/5ML IJ SOLN
INTRAMUSCULAR | Status: AC
  Filled 2015-05-31: qty 5

## 2015-05-31 MED ORDER — LACTATED RINGERS IV SOLN
INTRAVENOUS | Status: DC | PRN
  Administered 2015-05-31 (×2): via INTRAVENOUS

## 2015-05-31 MED ORDER — GLYCOPYRROLATE 0.2 MG/ML IJ SOLN
INTRAMUSCULAR | Status: DC | PRN
  Administered 2015-05-31: 0.2 mg via INTRAVENOUS

## 2015-05-31 MED ORDER — FUROSEMIDE 20 MG PO TABS: 20.0000 mg | ORAL_TABLET | Freq: Every day | ORAL | Status: DC | PRN

## 2015-05-31 MED ORDER — BELLADONNA ALKALOIDS-OPIUM 16.2-60 MG RE SUPP
RECTAL | Status: AC
  Filled 2015-05-31: qty 1

## 2015-05-31 MED ORDER — FENTANYL CITRATE (PF) 100 MCG/2ML IJ SOLN
25.0000 ug | INTRAMUSCULAR | Status: DC | PRN
  Administered 2015-05-31 (×2): 50 ug via INTRAVENOUS

## 2015-05-31 MED ORDER — DIPHENHYDRAMINE HCL 12.5 MG/5ML PO ELIX
12.5000 mg | ORAL_SOLUTION | Freq: Four times a day (QID) | ORAL | Status: DC | PRN
Start: 2015-05-31 — End: 2015-06-02

## 2015-05-31 MED ORDER — INSULIN ASPART 100 UNIT/ML ~~LOC~~ SOLN
0.0000 [IU] | Freq: Three times a day (TID) | SUBCUTANEOUS | Status: DC
  Administered 2015-05-31: 3 [IU] via SUBCUTANEOUS
  Administered 2015-06-02: 2 [IU] via SUBCUTANEOUS

## 2015-05-31 MED ORDER — PROPOFOL 10 MG/ML IV BOLUS
INTRAVENOUS | Status: AC
  Filled 2015-05-31: qty 20

## 2015-05-31 SURGICAL SUPPLY — 69 items
APPLICATOR COTTON TIP 6IN STRL (MISCELLANEOUS) ×4 IMPLANT
CABLE HIGH FREQUENCY MONO STRZ (ELECTRODE) IMPLANT
CATH FOLEY 2WAY SLVR 18FR 30CC (CATHETERS) ×4 IMPLANT
CATH HEMA 3WAY 30CC 22FR COUDE (CATHETERS) ×4 IMPLANT
CATH ROBINSON RED A/P 16FR (CATHETERS) IMPLANT
CATH TIEMANN FOLEY 18FR 5CC (CATHETERS) ×4 IMPLANT
CHLORAPREP W/TINT 26ML (MISCELLANEOUS) ×4 IMPLANT
CLIP LIGATING HEM O LOK PURPLE (MISCELLANEOUS) ×8 IMPLANT
CLOTH BEACON ORANGE TIMEOUT ST (SAFETY) ×4 IMPLANT
COVER MAYO STAND STRL (DRAPES) IMPLANT
COVER TIP SHEARS 8 DVNC (MISCELLANEOUS) ×4 IMPLANT
COVER TIP SHEARS 8MM DA VINCI (MISCELLANEOUS) ×4
CUTTER ECHEON FLEX ENDO 45 340 (ENDOMECHANICALS) ×4 IMPLANT
DECANTER SPIKE VIAL GLASS SM (MISCELLANEOUS) IMPLANT
DRAPE ARM DVNC X/XI (DISPOSABLE) ×8 IMPLANT
DRAPE COLUMN DVNC XI (DISPOSABLE) ×2 IMPLANT
DRAPE DA VINCI XI ARM (DISPOSABLE) ×8
DRAPE DA VINCI XI COLUMN (DISPOSABLE) ×2
DRSG TEGADERM 4X4.75 (GAUZE/BANDAGES/DRESSINGS) ×4 IMPLANT
DRSG TEGADERM 6X8 (GAUZE/BANDAGES/DRESSINGS) IMPLANT
ELECT REM PT RETURN 9FT ADLT (ELECTROSURGICAL) ×4
ELECTRODE REM PT RTRN 9FT ADLT (ELECTROSURGICAL) ×2 IMPLANT
GAUZE SPONGE 2X2 8PLY STRL LF (GAUZE/BANDAGES/DRESSINGS) ×2 IMPLANT
GLOVE BIO SURGEON STRL SZ 6.5 (GLOVE) ×3 IMPLANT
GLOVE BIO SURGEONS STRL SZ 6.5 (GLOVE) ×1
GLOVE BIOGEL M STRL SZ7.5 (GLOVE) ×12 IMPLANT
GLOVE BIOGEL PI IND STRL 6.5 (GLOVE) ×4 IMPLANT
GLOVE BIOGEL PI IND STRL 8 (GLOVE) ×2 IMPLANT
GLOVE BIOGEL PI INDICATOR 6.5 (GLOVE) ×4
GLOVE BIOGEL PI INDICATOR 8 (GLOVE) ×2
GOWN STRL REUS W/ TWL XL LVL3 (GOWN DISPOSABLE) ×2 IMPLANT
GOWN STRL REUS W/TWL LRG LVL3 (GOWN DISPOSABLE) ×16 IMPLANT
GOWN STRL REUS W/TWL LRG LVL4 (GOWN DISPOSABLE) IMPLANT
GOWN STRL REUS W/TWL XL LVL3 (GOWN DISPOSABLE) ×2
HOLDER FOLEY CATH W/STRAP (MISCELLANEOUS) ×4 IMPLANT
IV LACTATED RINGERS 1000ML (IV SOLUTION) IMPLANT
KIT PROCEDURE DA VINCI SI (MISCELLANEOUS) ×2
KIT PROCEDURE DVNC SI (MISCELLANEOUS) ×2 IMPLANT
LIQUID BAND (GAUZE/BANDAGES/DRESSINGS) ×4 IMPLANT
NEEDLE INSUFFLATION 14GA 120MM (NEEDLE) ×4 IMPLANT
PACK ROBOT UROLOGY CUSTOM (CUSTOM PROCEDURE TRAY) ×4 IMPLANT
PAD POSITIONING PINK XL (MISCELLANEOUS) ×4 IMPLANT
PORT ACCESS TROCAR AIRSEAL 12 (TROCAR) ×2 IMPLANT
PORT ACCESS TROCAR AIRSEAL 5M (TROCAR) ×2
RELOAD GREEN ECHELON 45 (STAPLE) ×4 IMPLANT
SEAL CANN UNIV 5-8 DVNC XI (MISCELLANEOUS) ×8 IMPLANT
SEAL XI 5MM-8MM UNIVERSAL (MISCELLANEOUS) ×8
SET TRI-LUMEN FLTR TB AIRSEAL (TUBING) ×4 IMPLANT
SET TUBE IRRIG SUCTION NO TIP (IRRIGATION / IRRIGATOR) ×4 IMPLANT
SHEET LAVH (DRAPES) ×4 IMPLANT
SOLUTION ELECTROLUBE (MISCELLANEOUS) ×4 IMPLANT
SPONGE GAUZE 2X2 STER 10/PKG (GAUZE/BANDAGES/DRESSINGS) ×2
SPONGE LAP 4X18 X RAY DECT (DISPOSABLE) ×4 IMPLANT
SUT ETHILON 3 0 PS 1 (SUTURE) ×4 IMPLANT
SUT MNCRL AB 4-0 PS2 18 (SUTURE) ×8 IMPLANT
SUT PDS AB 1 CT1 27 (SUTURE) ×8 IMPLANT
SUT PROLENE 0 CT 2 (SUTURE) ×20 IMPLANT
SUT V-LOC BARB 180 2/0GR6 GS22 (SUTURE) ×8
SUT VIC AB 0 CT2 27 (SUTURE) ×20 IMPLANT
SUT VIC AB 2-0 SH 27 (SUTURE) ×2
SUT VIC AB 2-0 SH 27X BRD (SUTURE) ×2 IMPLANT
SUT VICRYL 0 UR6 27IN ABS (SUTURE) ×4 IMPLANT
SUT VLOC BARB 180 ABS3/0GR12 (SUTURE) ×12
SUTURE V-LC BRB 180 2/0GR6GS22 (SUTURE) ×4 IMPLANT
SUTURE VLOC BRB 180 ABS3/0GR12 (SUTURE) ×6 IMPLANT
SYR 27GX1/2 1ML LL SAFETY (SYRINGE) IMPLANT
TOWEL OR 17X26 10 PK STRL BLUE (TOWEL DISPOSABLE) ×4 IMPLANT
TOWEL OR NON WOVEN STRL DISP B (DISPOSABLE) ×4 IMPLANT
WATER STERILE IRR 1500ML POUR (IV SOLUTION) IMPLANT

## 2015-05-31 NOTE — Transfer of Care (Signed)
Immediate Anesthesia Transfer of Care Note  Patient: Benjamin Shepard  Procedure(s) Performed: Procedure(s): XI ROBOTIC ASSISTED SIMPLE PROSTATECTOMY (N/A) ROBOT ASSISTED INGUINAL HERNIA REPAIR (Bilateral)  Patient Location: PACU  Anesthesia Type:General  Level of Consciousness:  sedated, patient cooperative and responds to stimulation  Airway & Oxygen Therapy:Patient Spontanous Breathing and Patient connected to face mask oxgen  Post-op Assessment:  Report given to PACU RN and Post -op Vital signs reviewed and stable  Post vital signs:  Reviewed and stable  Last Vitals:  Filed Vitals:   05/31/15 0630 05/31/15 0632  BP: 120/67   Pulse: 81   Temp: 36.5 C   Resp: 18 18    Complications: No apparent anesthesia complications

## 2015-05-31 NOTE — Op Note (Signed)
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  Bilateral inguinal hernias.  PROCEDURES: 1. Robotic-assisted laparoscopic simple prostatectomy. 2. Bilateral laparoscopic inguinal hernia repair.  ESTIMATED BLOOD LOSS:  300 mL.  COMPLICATION:  None.  SPECIMEN:  Prostate adenoma to Pathology.  FINDINGS: 1. Massive bilobar prostatic hypertrophy as anticipated.  Complete     resolution of all visible adenoma following simple prostatectomy. 2. Right combination direct and indirect inguinal hernias, complete     resolution of defect following repair. 3. Left direct inguinal hernia, complete resolution of visible defect     following hernia repair.  DRAINS: 1. Jackson-[name] drain to bulb suction. 2. Three-way hematuria, Foley catheter to straight drain, irrigation     port plugged.  INDICATION:  [name] is a very pleasant 70 year old gentleman with history of obesity and some cardiovascular comorbidity, very well controlled, who has had a long prodrome of refractory voiding complaints that culminated in frank urinary retention, which he has been in now for several months.  He underwent initial trial of medical therapy with bladder resting catheterization, but failed trial of void.  Imaging revealed a greater than 300 g prostate.  Ht was referred for consideration of simple prostatectomy.  Preoperative urodynamics did reveal an obstructive pattern with a preserved bladder function, but minimal flow physiology.  Options were discussed including a  chronic catheters versus self-catheter versus simple prostatectomy versus transurethral procedures with relative risks, benefits likely have success and he wished to proceed with robotic simple prostatectomy.  He underwent cardiac clearance and was found to be suitable risk.  Informed consent was obtained and placed in the medical record.  PROCEDURE IN DETAIL:  The patient being Benjamin Shepard, was verified.  Procedure being robotic simple prostatectomy was confirmed. Procedure was carried out.  Time-out was performed.  Intravenous antibiotics were administered.  General endotracheal anesthesia was introduced.  The patient placed into a low-lithotomy position, taken great care to tuck his arms at a side after gel and foam padding, further fashioned to the operative table using 3-inch tape over foam padding across his supraxiphoid chest.  A test of steep Trendelenburg positioning was performed.  He was found to be suitably positioned.  The sterile field was created by prepping and draping the patient's penis, perineum, and proximal thighs using iodine x3 and his infra-xiphoid abdomen using chlorhexidine gluconate.  Next, a high-flow, low-pressure pneumoperitoneum was obtained using Veress technique in the supraumbilical midline having passed the aspiration and drop test.  An 8- mm robotic camera port was placed in this location.  Laparoscopic examination of the peritoneal cavity revealed no significant adhesions, no visceral injury.  Additional ports were placed as follows:  Right paramedian 8-mm robotic port, right far lateral 12-mm AirSeal assistant port, 5-mm paramedian suction assistant port, left paramedian 8-mm robotic port and  left far lateral 8-mm robotic port.  Robot was docked and passed through electronic checks.  Initial attention was directed at development of space of Retzius.  Incision was made lateral to the right medial umbilical ligament from the midline towards the  area of the internal ring.  Vas deferens were purposely ligated, used the bucket- handle for gentle medial traction and the lateral aspect of the bladder was swept away from the pelvic sidewall towards the endopelvic fascia. A mirror-imaged dissection was performed on the left side.  At this point, it became very clear after developing space of Retzius that the patient had significant bilateral inguinal hernias with a right combination of direct and indirect.  With his direct hernia defect being approximately 3 cm, his indirect being approximately 1.5 cm and his left- sided direct hernia defect approximately 4 cm.  It was felt that given the size of this defect that repair being indicated at the conclusion of procedure as long as the simple prostatectomy was uneventful.  The bladder neck was identified moving the Foley catheter back and forth as was the dorsal venous complex, it was carefully controlled using vascular load stapler to help prevent venous back bleeding.  Bladder neck was then incised in the anterior-posterior direction down to approximately the 4 o'clock and 8 o'clock positions.  The ureteral orifices were positively identified and a figure-of-eight Vicryl stitch to place at each one of these corners to help prevent inadvertent bladder tearing and allowed identification of the ureteral orifices. Next, an adenoma dissection was performed after stay sutures of figure- of-eight Vicryl applied on the right lobe, left lobe and inferior portion respectively.  Posterior adenoma dissection was performed freeing the adenoma away from the posterior capsule of the prostate towards the area of the apex as verified by the curvature of the prostate back towards the apex.  Next, the right lobe was similarly mobilized away from the prostatic capsule as was the left lobe. Anterior dissection was carefully performed within the adenoma plane towards the apex of the prostate.  Given the size  of the prostate, the apex was somewhat difficult to appreciate at its level of the membranous urethra.  Therefore, the adenoma was purposely bisected in the anterior plane following the catheter down to the apex, which then allowed completion of the apical dissection of the adenoma.  This completely freed up the very large adenoma specimen, which was placed into an EndoCatch bag for later retrieval.  Following these maneuvers, the area of the prostatic capsule appeared to be completely visibly intact. There was good hemostasis.  A posterior mucosal advancement was performed using double-armed V-Loc suture reapproximating the posterior half of the urethra to the posterior bladder neck mucosa bringing these structures in a tension-free apposition.  This purposely displaced the ureters inferiorly and created a mucosal floor within the adenoma defect and the prostatic capsule.  Next, the bladder neck was carefully reapproximated using two separate suture lines of running 2-0 V-Loc.  A 22-French 3-way Coude hematuria catheter was then carefully placed under robotic vision, 30 mL sterile water placed in the balloon.  It was irrigated quantitatively without leak.  Attention was then directed to the bilateral inguinal hernia repair.  First on the right side, the indirect smallest component was controlled using a single figure-of- eight 0 Prolene, resulted in excellent fascial apposition.  The right- sided direct hernia defect was then closed using figure-of-eight Prolene x3 and finally, the left direct hernia defect closed using figure-of- eight Prolene x3.  Following these maneuvers, hemostasis appeared excellent.  All sponge and needle counts were correct.  Closed suction drain was brought through the previous left lateral most robotic port site near the peritoneal cavity.  Robot was then undocked.  The previous right lateral most assistant port site was closed at the level of fascia using  Carter-Thomason suture passer under laparoscopic vision.  Drain stitch was applied.  Specimen was retrieved by extending the previous camera port site inferiorly for distance approximately 4 cm, removing the adenoma, setting aside for permanent pathology.  The extraction site was closed at the level of fascia using figure-of-eight PDS x3 followed by reapproximation of Scarpa's with running Vicryl.  All incision sites were infiltrated with dilute lyophilized Marcaine and closed at the level of the skin using subcuticular Monocryl followed by Dermabond. Procedure was then terminated.  The patient tolerated the procedure well.  There were no immediate periprocedural complications.  The patient was taken to the postanesthesia care unit in stable condition.          ______________________________ Benjamin Frock, MD     TM/MEDQ  D:  05/31/2015  T:  05/31/2015  Job:  [id number]

## 2015-05-31 NOTE — Progress Notes (Signed)
Hgb. And Hct. Drawn by lab. 

## 2015-05-31 NOTE — Discharge Instructions (Signed)

## 2015-05-31 NOTE — Op Note (Deleted)
Preoperative diagnosis:  1. BPH  Postoperative diagnosis: 1. BPH 2. Bilateral Inguinal Hernias, direct and indirect  Procedure(s): 1. Robotic assisted, laparoscopic suprapubic simple prostatectomy 2. Bilateral, robot assisted, laparoscopic hernia repair.   Surgeon: Dr. Tonny Bollman, MD Assistant: Debbrah Alar, in absence of qualified resident Resident: Dr. Jearld Adjutant, MD  Anesthesia: General  Complications: None  EBL: 300  Specimens: Prostate adenoma  Disposition of specimens: pathology for gross only  Intraoperative findings: Large prostate. Bilateral direct and indirect hernias  Indication: 70 y.o. male with BPH and LUTs with a 300 g prostate here for robotic simple prostatectomy.  Description of procedure:  The patient was brought back to the OR where general anesthesia was induced. The patient was placed in lithotomy position with low legs and antibiotics were administered. A time out was performed prior to incision.  We began with a midline 15 mm incision above the umbilicus and used a Veress Needle to insuflate the abdomen. The Veress was removed and an 8 mm robotic port was placed and the lens was placed in the body that showed no injury. The remainder of the ports were placed in a typical prostate fashion.  We began by taking down the sigmoid attachments to allow mobilization of the bowel. We then took down the bladder to enter the space of retzius. We encountered bilateral indirect and direct hernias. We paid careful attention not to injure any potential bowel in the hernia sac. We dissected down to the endopelvic fascia and stapled the dorsal vein to allow for better hemostasis.  We then opened the bladder in a transverse fashion and placed 2-0 vicyl sutures at each apex of the incision. We were able to easily identify the UOs bilaterally. We then placed 3 0 Vicryl sutures in the lateral and median lobes of the prostate respectively to allow retraction. We then  incised the bladder at the level of the prostate, paying careful attention not to injure the UOs and dissected through the prostate capsule to the adenoma underneath. We dissected the adenoma out of the prostatic capsule, maintaining careful hemostasis throughout and in the end removed the adenoma intact with little residual tissue remaining. The adenoma was placed in a specimen bag for retrieval.   We then used 3-0 V-lock suture to bring the bladder neck to the urethra within the prostatic fossa, this allowed easy passage of a foley catheter. We then closed the bladder with 2-0 V-lock and tested this by filling the bladder and this was water tight.   At this point we closed the bilateral hernias with 2-0 proline in interrupted figure of 8 sutures by closing the available fascia together. The hernias were repaired in this manner bilaterally.   We then placed a blake drain in the pelvis through one of the robotic prost and secured this with nylon. We then removed all instruments and undocked the robot. The midline incision was extended and the specimen removed. We then closed the 12 mm port fascia and midline fascia with 0 PDS. The midline was done in interrupted figure of 8 sutures. The skin was closed with 4-0 vicryl.  All counts were correct at the conclusion of the procedure.

## 2015-05-31 NOTE — Anesthesia Postprocedure Evaluation (Signed)
Anesthesia Post Note  Patient: Benjamin Shepard  Procedure(s) Performed: Procedure(s) (LRB): XI ROBOTIC ASSISTED SIMPLE PROSTATECTOMY (N/A) ROBOT ASSISTED INGUINAL HERNIA REPAIR (Bilateral)  Patient location during evaluation: PACU Anesthesia Type: General Level of consciousness: awake and alert Pain management: pain level controlled Vital Signs Assessment: post-procedure vital signs reviewed and stable Respiratory status: spontaneous breathing, nonlabored ventilation, respiratory function stable and patient connected to nasal cannula oxygen Cardiovascular status: blood pressure returned to baseline and stable Postop Assessment: no signs of nausea or vomiting Anesthetic complications: no    Last Vitals:  Filed Vitals:   05/31/15 1550 05/31/15 1649  BP: 136/63   Pulse: 64 64  Temp: 36.8 C 36.8 C  Resp: 16 16    Last Pain:  Filed Vitals:   05/31/15 1825  PainSc: 0-No pain                 [name]

## 2015-05-31 NOTE — Progress Notes (Signed)
Lab results noted. 

## 2015-05-31 NOTE — H&P (Signed)
Benjamin Shepard is an 70 y.o. male.    Chief Complaint: Pre-op Simple Prostatectomy  HPI:   1 - Enlarged Prostate With Urinary Retention - recurrent urinary retention since 01/2015 with ER foley x 2 for volumes 1-1.4L. Prostate 306gm mostly lateral lobe hypertrophy and no strictures.  On doxazosin and finasteride at baseline. Now with foley in place which he is managing well with. UDS 04/2015 corroborate obstruction wiht Pdet 44 at Qm 7, minimal instability. He is diabetic.    2 - Microscopic Hematuria - eval 2016 with CT and cysto with large / friable prostate and punctate bladder stones only. FISH negative.   3 - Prostate Screening - PSA 3.69 2016 on part of "for cause" evaluation in setting of urinary retention.   PMH sig for DM2, Obesity (312 lbs), DOE (negative hear cath 2017, follows Daneen Schick, cards). His PCP is Velna Hatchet MD. Pt's wife is s/p bilateral leg amputation and pt is primary caretaker for her, they are trying to arrange alternative care situation (SNF or other).  Today " Benjamin Shepard " is seen to proceed with robotic simple prostatectomy.   Past Medical History  Diagnosis Date  . HTN (hypertension)   . Edema   . Diabetes ([place])   . Gout   . Hyperlipidemia   . Shortness of breath dyspnea     WITH EXERTION  . Psoriasis   . BPH (benign prostatic hyperplasia)   . Urinary retention   . Foley catheter in place   . OSA (obstructive sleep apnea)     USES C-PAP    Past Surgical History  Procedure Laterality Date  . Cardiac catheterization N/A 04/14/2015    Procedure: Left Heart Cath and Coronary Angiography;  Surgeon: Belva Crome, MD;  Location: [facility];  Service: Cardiovascular;  Laterality: N/A;  . Back surgery      AGE 69    Family History  Problem Relation Age of Onset  . Hypertension Father   . Heart Problems Mother   . Heart failure Mother   . Heart disease Mother   . Colon cancer Neg Hx   . Heart disease Brother   . Heart disease Brother     Social History:  reports that he quit smoking about 17 years ago. His smoking use included Cigarettes. He has never used smokeless tobacco. He reports that he drinks alcohol. He reports that he does not use illicit drugs.  Allergies:  Allergies  Allergen Reactions  . Shrimp [Shellfish Allergy]     "throat swelling, SOB"  . Other Rash    Muscle relaxer    Medications Prior to Admission  Medication Sig Dispense Refill  . allopurinol (ZYLOPRIM) 100 MG tablet Take 100 mg by mouth daily.    Marland Kitchen amLODipine (NORVASC) 5 MG tablet Take 5 mg by mouth at bedtime.     Marland Kitchen aspirin 81 MG tablet Take 81 mg by mouth daily.    Marland Kitchen atorvastatin (LIPITOR) 20 MG tablet Take 20 mg by mouth daily.    Marland Kitchen doxazosin (CARDURA) 8 MG tablet TAKE 2 TABLETS BY MOUTH EVERY DAY 60 tablet 10  . finasteride (PROSCAR) 5 MG tablet Take 1 tablet by mouth daily.  5  . losartan-hydrochlorothiazide (HYZAAR) 100-25 MG per tablet Take 1 tablet by mouth daily. 30 tablet 11  . metFORMIN (GLUCOPHAGE) 500 MG tablet Take 500 mg by mouth 2 (two) times daily with a meal.    . TAZTIA XT 360 MG 24 hr capsule TAKE 1 CAPSULE  BY MOUTH EVERY DAY 30 capsule 10  . colchicine 0.6 MG tablet Take 0.6 mg by mouth daily as needed (gout).     . furosemide (LASIX) 20 MG tablet Take 20 mg by mouth daily as needed (leg swelling).      Results for orders placed or performed during the hospital encounter of 05/31/15 (from the past 48 hour(s))  Glucose, capillary     Status: None   Collection Time: 05/31/15  6:27 AM  Result Value Ref Range   Glucose-Capillary 99 65 - 99 mg/dL   Comment 1 Notify RN    Comment 2 Document in Chart    No results found.  Review of Systems  Constitutional: Negative.  Negative for fever and chills.  HENT: Negative.   Eyes: Negative.   Respiratory: Negative.   Cardiovascular: Negative.   Gastrointestinal: Negative.   Genitourinary: Negative.  Negative for hematuria and flank pain.  Musculoskeletal: Negative.   Skin:  Negative.   Neurological: Negative.   Endo/Heme/Allergies: Negative.   Psychiatric/Behavioral: Negative.     Blood pressure 120/67, pulse 81, temperature 97.7 F (36.5 C), resp. rate 18, height 6\' 1"  (1.854 m), weight 132.904 kg (293 lb), SpO2 99 %. Physical Exam  Constitutional: He appears well-developed.  HENT:  Head: Normocephalic.  Eyes: Pupils are equal, round, and reactive to light.  Neck: Normal range of motion.  Cardiovascular: Normal rate.   Respiratory: Effort normal.  GI: Soft.  Moderate truncal obesity  Genitourinary: Penis normal.  No CVAT  Musculoskeletal: Normal range of motion.  Neurological: He is alert.  Skin: Skin is warm.  Psychiatric: He has a normal mood and affect. His behavior is normal. Judgment and thought content normal.     Assessment/Plan     1 - Enlarged Prostate With Urinary Retention - massive prostate certainly contributory and obstruction pattern by UDS with preserved contractility. Discussed role of robotic simple prostatetomy with goal of eventual catheter free and he opts to proceed. He is miserable with catheter and adamantly wants to try to get catheter free.  We rediscussed simple prostatectomy and specifically robotic simple prostatectomy being the technique that I most commonly perform. I showed the patient on their abdomen the approximately 6 small incision (trocar) sites as well as presumed extraction sites with robotic approach as well as possible open incision sites should open conversion be necessary. We rediscussed peri-operative risks including bleeding, infection, deep vein thrombosis, pulmonary embolism, compartment syndrome, nuropathy / neuropraxia, bladder neck contracture, heart attack, stroke, death, as well as long-term risks such as non-cure / need for additional therapy. We specifically readdressed that the procedure is the most definitive in terms of treatment for refractory BPH but that future regrowth is possible and that this  operation does NOT obviate the need for future prostate cancer screening.  We rediscussed the typical hospital course including usual 1-2 night hospitalization, discharge with foley catheter in place usually for 1-2 weeks before voiding trial as well as usually 2 week recovery until able to perform most non-strenuous activity and 6 weeks until able to return to most jobs and more strenuous activity such as exercise.   2 - Microscopic Hematuria - likely from massive prostate / small bladder stones. Eval w/o malignant etiologies identified.   3 - Prostate Screening - up to date this years, no role for further screenign at his age and level of comorbidity.   Alexis Frock, MD 05/31/2015, 7:05 AM

## 2015-05-31 NOTE — Progress Notes (Signed)
Dr. Tresa Moore notified that patient's Foley catheter not draining sufficiently-small amount thick red urine in tubing- Dr. Carlota Raspberry in to see patient- manipultaed catheter- irrigated it with Normal Saline- draining thinner red urine now

## 2015-05-31 NOTE — Progress Notes (Signed)
patient states he was called in K but time limited him in taking all the tablets. He was able to take 4 out of 9

## 2015-05-31 NOTE — Brief Op Note (Signed)
05/31/2015  1:20 PM  PATIENT:  Benjamin Shepard  70 y.o. male  PRE-OPERATIVE DIAGNOSIS:  MASSIVE PROSTATIC HYPERTROPHY  POST-OPERATIVE DIAGNOSIS:  MASSIVE PROSTATIC HYPERTROPHY  PROCEDURE:  Procedure(s): XI ROBOTIC ASSISTED SIMPLE PROSTATECTOMY (N/A) ROBOT ASSISTED INGUINAL HERNIA REPAIR (Bilateral)  SURGEON:  Surgeon(s) and Role:    * Alexis Frock, MD - Primary  PHYSICIAN ASSISTANT:   ASSISTANTS: Debbrah Alar, PA   ANESTHESIA:   local and general  EBL:  Total I/O In: 2300 [I.V.:2300] Out: 300 [Blood:300]  BLOOD ADMINISTERED:none  DRAINS: 1 - JP to bulb, 2 - Three way foley to gravity (irrigation port plugged)   LOCAL MEDICATIONS USED:  MARCAINE     SPECIMEN:  Source of Specimen:  prostate adenoma  DISPOSITION OF SPECIMEN:  PATHOLOGY  COUNTS:  YES  TOURNIQUET:  * No tourniquets in log *  DICTATION: .Other Dictation: Dictation Number [id number]  PLAN OF CARE: Admit to inpatient   PATIENT DISPOSITION:  PACU - hemodynamically stable.   Delay start of Pharmacological VTE agent (>24hrs) due to surgical blood loss or risk of bleeding: yes

## 2015-05-31 NOTE — Anesthesia Procedure Notes (Signed)
Procedure Name: Intubation Date/Time: 05/31/2015 8:33 AM Performed by: Anne Fu Pre-anesthesia Checklist: Patient identified, Emergency Drugs available, Suction available, Patient being monitored and Timeout performed Patient Re-evaluated:Patient Re-evaluated prior to inductionOxygen Delivery Method: Circle system utilized Preoxygenation: Pre-oxygenation with 100% oxygen Intubation Type: IV induction Ventilation: Mask ventilation without difficulty Laryngoscope Size: Mac and 4 Grade View: Grade II Tube type: Oral Tube size: 7.5 mm Number of attempts: 1 Airway Equipment and Method: Stylet Placement Confirmation: ETT inserted through vocal cords under direct vision,  positive ETCO2,  CO2 detector and breath sounds checked- equal and bilateral Tube secured with: Tape Dental Injury: Teeth and Oropharynx as per pre-operative assessment and Injury to lip

## 2015-06-01 LAB — GLUCOSE, CAPILLARY
GLUCOSE-CAPILLARY: 116 mg/dL — AB (ref 65–99)
GLUCOSE-CAPILLARY: 118 mg/dL — AB (ref 65–99)
Glucose-Capillary: 106 mg/dL — ABNORMAL HIGH (ref 65–99)
Glucose-Capillary: 124 mg/dL — ABNORMAL HIGH (ref 65–99)

## 2015-06-01 LAB — BASIC METABOLIC PANEL
Anion gap: 7 (ref 5–15)
BUN: 12 mg/dL (ref 6–20)
CHLORIDE: 107 mmol/L (ref 101–111)
CO2: 29 mmol/L (ref 22–32)
CREATININE: 1 mg/dL (ref 0.61–1.24)
Calcium: 8.9 mg/dL (ref 8.9–10.3)
GFR calc Af Amer: >60 mL/min (ref >60)
GLUCOSE: 111 mg/dL — AB (ref 65–99)
POTASSIUM: 3.4 mmol/L — AB (ref 3.5–5.1)
SODIUM: 143 mmol/L (ref 135–145)

## 2015-06-01 LAB — HEMOGLOBIN AND HEMATOCRIT, BLOOD
HCT: 34.8 % — ABNORMAL LOW (ref 39.0–52.0)
HEMOGLOBIN: 11.3 g/dL — AB (ref 13.0–17.0)

## 2015-06-01 MED ORDER — VITAMINS A & D EX OINT
TOPICAL_OINTMENT | CUTANEOUS | Status: AC
Start: 2015-06-01 — End: 2015-06-01
  Administered 2015-06-01: 1
  Filled 2015-06-01: qty 5

## 2015-06-01 NOTE — Progress Notes (Signed)
1 Day Post-Op  Subjective:  1- Massive Prostate Hypertrophy with Urinary Retention - s/p robotic simple prostatectomy 05/31/15 for 300g prostate with refracotry urinary retention. Hgb 3/2 11.3 and JP output scant.  Today " Benjamin Shepard" is stable. Urine clearing. NO fevers. Pain controlled.  Objective: Vital signs in last 24 hours: Temp:  [97.2 F (36.2 C)-98.2 F (36.8 C)] 97.5 F (36.4 C) (03/02 0529) Pulse Rate:  [51-74] 74 (03/02 0529) Resp:  [16-23] 18 (03/02 0529) BP: (115-174)/(52-68) 127/52 mmHg (03/02 0529) SpO2:  [95 %-100 %] 95 % (03/02 0529) FiO2 (%):  [2 %] 2 % (03/01 1649) Weight:  [132 kg (291 lb 0.1 oz)] 132 kg (291 lb 0.1 oz) (03/01 1647) Last BM Date: 05/31/15  Intake/Output from previous day: 03/01 0701 - 03/02 0700 In: 5468.3 [P.O.:360; I.V.:4108.3; IV Piggyback:1000] Out: 1490 [Urine:1125; Drains:65; Blood:300] Intake/Output this shift:    NAD, AOx3 Minimal [place] O2, non-labored breathing SNTND, port sites c/d/i, Scant JP output that is serosanguinous FOley c/d/i with irriation prot plugged, dark pink urine that is thin / no clots SCD's in place  Lab Results:   Recent Labs  05/31/15 1322 06/01/15 0440  HGB 13.1 11.3*  HCT 41.0 34.8*   BMET  Recent Labs  06/01/15 0440  NA 143  K 3.4*  CL 107  CO2 29  GLUCOSE 111*  BUN 12  CREATININE 1.00  CALCIUM 8.9   PT/INR No results for input(s): LABPROT, INR in the last 72 hours. ABG No results for input(s): PHART, HCO3 in the last 72 hours.  Invalid input(s): PCO2, PO2  Studies/Results: No results found.  Anti-infectives: Anti-infectives    Start     Dose/Rate Route Frequency Ordered Stop   05/31/15 0600  gentamicin (GARAMYCIN) 500 mg in dextrose 5 % 100 mL IVPB     500 mg 112.5 mL/hr over 60 Minutes Intravenous  Once 05/30/15 1345 05/31/15 0835   05/31/15 0000  gentamicin (GARAMYCIN) 500 mg in dextrose 5 % 50 mL IVPB  Status:  Discontinued     500 mg 125 mL/hr over 30 Minutes Intravenous 30 min  pre-op 05/30/15 1327 05/30/15 1345   05/31/15 0000  sulfamethoxazole-trimethoprim (BACTRIM DS,SEPTRA DS) 800-160 MG tablet     1 tablet Oral 2 times daily 05/31/15 1236        Assessment/Plan:  1- Massive Prostate Hypertrophy with Urinary Retention - doing well POD 1. SLIV, Adv diet, likely DC JP in PM if output remains scant. Foley to continue.  Likely DC tomorrow based on current progress.    [facility], Dang Mathison 06/01/2015

## 2015-06-02 LAB — GLUCOSE, CAPILLARY: Glucose-Capillary: 121 mg/dL — ABNORMAL HIGH (ref 65–99)

## 2015-06-02 MED ORDER — SENNOSIDES-DOCUSATE SODIUM 8.6-50 MG PO TABS: 1.0000 | ORAL_TABLET | Freq: Two times a day (BID) | ORAL | Status: DC

## 2015-06-02 NOTE — Discharge Summary (Signed)
Physician Discharge Summary  Patient ID: Benjamin Shepard MRN: [id number] DOB/AGE: March 16, 1946 70 y.o.  Admit date: 05/31/2015 Discharge date: 06/02/2015  Admission Diagnoses: BPH with urinary retention  Discharge Diagnoses:  Active Problems:   BPH (benign prostatic hypertrophy) with urinary obstruction   Discharged Condition: good  Hospital Course:   1- Massive Prostate Hypertrophy with Urinary Retention - s/p robotic simple prostatectomy 05/31/15 for 300g prostate with refracotry urinary retention. Hgb 3/2 11.3 and JP output scant therefore removed. By 3/3, the day of discharge, he is ambulatory, pain controlled on PO meds, maintaining PO intake, and felt to be adequate for discharge. Foley will be continued at home. Final pathology pending at discharge.     Consults: None  Significant Diagnostic Studies: labs: as per above  Treatments: surgery: as per abvoe  Discharge Exam: Blood pressure 137/60, pulse 74, temperature 98.1 F (36.7 C), temperature source Oral, resp. rate 18, height 6\' 1"  (1.854 m), weight 132 kg (291 lb 0.1 oz), SpO2 95 %. General appearance: alert, cooperative, appears stated age and using nasal CPAP, then off.  Eyes: negative Nose: Nares normal. Septum midline. Mucosa normal. No drainage or sinus tenderness. Throat: lips, mucosa, and tongue normal; teeth and gums normal Neck: supple, symmetrical, trachea midline Back: symmetric, no curvature. ROM normal. No CVA tenderness. Resp: non-labored on room air.  Cardio: Nl rate GI: soft, non-tender; bowel sounds normal; no masses,  no organomegaly Male genitalia: normal, foley c/d/i wtih tea-colored urine w/o clots. irrigation port plugged.  Extremities: extremities normal, atraumatic, no cyanosis or edema Pulses: 2+ and symmetric Skin: Skin color, texture, turgor normal. No rashes or lesions Lymph nodes: Cervical, supraclavicular, and axillary nodes normal. Neurologic: Grossly normal Incision/Wound: recent port  sites / extraction sites c/d/i. Stable truncal obesity.   Disposition: 01-Home or Self Care     Medication List    STOP taking these medications        aspirin 81 MG tablet     finasteride 5 MG tablet  Commonly known as:  PROSCAR      TAKE these medications        allopurinol 100 MG tablet  Commonly known as:  ZYLOPRIM  Take 100 mg by mouth daily.     amLODipine 5 MG tablet  Commonly known as:  NORVASC  Take 5 mg by mouth at bedtime.     atorvastatin 20 MG tablet  Commonly known as:  LIPITOR  Take 20 mg by mouth daily.     colchicine 0.6 MG tablet  Take 0.6 mg by mouth daily as needed (gout).     doxazosin 8 MG tablet  Commonly known as:  CARDURA  TAKE 2 TABLETS BY MOUTH EVERY DAY     furosemide 20 MG tablet  Commonly known as:  LASIX  Take 20 mg by mouth daily as needed (leg swelling).     HYDROcodone-acetaminophen 5-325 MG tablet  Commonly known as:  NORCO  Take 1-2 tablets by mouth every 6 (six) hours as needed.     losartan-hydrochlorothiazide 100-25 MG tablet  Commonly known as:  HYZAAR  Take 1 tablet by mouth daily.     metFORMIN 500 MG tablet  Commonly known as:  GLUCOPHAGE  Take 500 mg by mouth 2 (two) times daily with a meal.     senna-docusate 8.6-50 MG tablet  Commonly known as:  Senokot-S  Take 1 tablet by mouth 2 (two) times daily. While taking pain meds to prevent constipation.     sulfamethoxazole-trimethoprim 800-160 MG tablet  Commonly known as:  BACTRIM DS,SEPTRA DS  Take 1 tablet by mouth 2 (two) times daily. Start the day prior to foley removal appointment     TAZTIA XT 360 MG 24 hr capsule  Generic drug:  diltiazem  TAKE 1 CAPSULE BY MOUTH EVERY DAY           Follow-up Information    Follow up with Alexis Frock, MD On 06/08/2015.   Specialty:  Urology   Why:  at 9:45 AM for X-Ray, MD visit, and possible catheter removal   Contact information:   [place] [address] [phone]       Signed: Alexis Frock 06/02/2015, 7:09 AM

## 2015-06-08 DIAGNOSIS — R338 Other retention of urine: Secondary | ICD-10-CM | POA: Diagnosis not present

## 2015-06-08 DIAGNOSIS — N401 Enlarged prostate with lower urinary tract symptoms: Secondary | ICD-10-CM | POA: Diagnosis not present

## 2015-06-26 DIAGNOSIS — E119 Type 2 diabetes mellitus without complications: Secondary | ICD-10-CM | POA: Diagnosis not present

## 2015-06-26 DIAGNOSIS — R339 Retention of urine, unspecified: Secondary | ICD-10-CM | POA: Diagnosis not present

## 2015-06-26 DIAGNOSIS — I10 Essential (primary) hypertension: Secondary | ICD-10-CM | POA: Diagnosis not present

## 2015-06-26 DIAGNOSIS — Z6841 Body Mass Index (BMI) 40.0 and over, adult: Secondary | ICD-10-CM | POA: Diagnosis not present

## 2015-06-29 DIAGNOSIS — N390 Urinary tract infection, site not specified: Secondary | ICD-10-CM | POA: Diagnosis not present

## 2015-06-29 DIAGNOSIS — R8299 Other abnormal findings in urine: Secondary | ICD-10-CM | POA: Diagnosis not present

## 2015-06-29 DIAGNOSIS — E119 Type 2 diabetes mellitus without complications: Secondary | ICD-10-CM | POA: Diagnosis not present

## 2015-06-29 DIAGNOSIS — M109 Gout, unspecified: Secondary | ICD-10-CM | POA: Diagnosis not present

## 2015-06-29 DIAGNOSIS — Z125 Encounter for screening for malignant neoplasm of prostate: Secondary | ICD-10-CM | POA: Diagnosis not present

## 2015-06-29 DIAGNOSIS — E784 Other hyperlipidemia: Secondary | ICD-10-CM | POA: Diagnosis not present

## 2015-07-06 DIAGNOSIS — Z23 Encounter for immunization: Secondary | ICD-10-CM | POA: Diagnosis not present

## 2015-07-06 DIAGNOSIS — M109 Gout, unspecified: Secondary | ICD-10-CM | POA: Diagnosis not present

## 2015-07-06 DIAGNOSIS — Z0000 Encounter for general adult medical examination without abnormal findings: Secondary | ICD-10-CM | POA: Diagnosis not present

## 2015-07-06 DIAGNOSIS — N401 Enlarged prostate with lower urinary tract symptoms: Secondary | ICD-10-CM | POA: Diagnosis not present

## 2015-07-06 DIAGNOSIS — E291 Testicular hypofunction: Secondary | ICD-10-CM | POA: Diagnosis not present

## 2015-07-06 DIAGNOSIS — E119 Type 2 diabetes mellitus without complications: Secondary | ICD-10-CM | POA: Diagnosis not present

## 2015-07-06 DIAGNOSIS — Z6841 Body Mass Index (BMI) 40.0 and over, adult: Secondary | ICD-10-CM | POA: Diagnosis not present

## 2015-07-06 DIAGNOSIS — I10 Essential (primary) hypertension: Secondary | ICD-10-CM | POA: Diagnosis not present

## 2015-07-06 DIAGNOSIS — E1121 Type 2 diabetes mellitus with diabetic nephropathy: Secondary | ICD-10-CM | POA: Diagnosis not present

## 2015-07-06 DIAGNOSIS — E784 Other hyperlipidemia: Secondary | ICD-10-CM | POA: Diagnosis not present

## 2015-07-06 DIAGNOSIS — G4733 Obstructive sleep apnea (adult) (pediatric): Secondary | ICD-10-CM | POA: Diagnosis not present

## 2015-07-06 DIAGNOSIS — Z1389 Encounter for screening for other disorder: Secondary | ICD-10-CM | POA: Diagnosis not present

## 2015-07-22 ENCOUNTER — Other Ambulatory Visit: Payer: Self-pay | Admitting: Interventional Cardiology

## 2015-08-09 ENCOUNTER — Other Ambulatory Visit: Payer: Self-pay | Admitting: Interventional Cardiology

## 2015-09-06 ENCOUNTER — Other Ambulatory Visit: Payer: Self-pay | Admitting: Cardiovascular Disease

## 2015-09-12 ENCOUNTER — Other Ambulatory Visit: Payer: Self-pay | Admitting: Interventional Cardiology

## 2015-11-06 DIAGNOSIS — E1140 Type 2 diabetes mellitus with diabetic neuropathy, unspecified: Secondary | ICD-10-CM | POA: Diagnosis not present

## 2015-11-06 DIAGNOSIS — Z6841 Body Mass Index (BMI) 40.0 and over, adult: Secondary | ICD-10-CM | POA: Diagnosis not present

## 2015-11-06 DIAGNOSIS — R0600 Dyspnea, unspecified: Secondary | ICD-10-CM | POA: Diagnosis not present

## 2015-11-06 DIAGNOSIS — E784 Other hyperlipidemia: Secondary | ICD-10-CM | POA: Diagnosis not present

## 2015-11-06 DIAGNOSIS — I10 Essential (primary) hypertension: Secondary | ICD-10-CM | POA: Diagnosis not present

## 2015-11-06 DIAGNOSIS — E119 Type 2 diabetes mellitus without complications: Secondary | ICD-10-CM | POA: Diagnosis not present

## 2015-11-21 DIAGNOSIS — S46212A Strain of muscle, fascia and tendon of other parts of biceps, left arm, initial encounter: Secondary | ICD-10-CM | POA: Diagnosis not present

## 2015-11-23 DIAGNOSIS — H5211 Myopia, right eye: Secondary | ICD-10-CM | POA: Diagnosis not present

## 2015-11-23 DIAGNOSIS — H52223 Regular astigmatism, bilateral: Secondary | ICD-10-CM | POA: Diagnosis not present

## 2015-11-23 DIAGNOSIS — E119 Type 2 diabetes mellitus without complications: Secondary | ICD-10-CM | POA: Diagnosis not present

## 2015-11-23 DIAGNOSIS — H524 Presbyopia: Secondary | ICD-10-CM | POA: Diagnosis not present

## 2015-12-05 DIAGNOSIS — S46212D Strain of muscle, fascia and tendon of other parts of biceps, left arm, subsequent encounter: Secondary | ICD-10-CM | POA: Diagnosis not present

## 2015-12-14 DIAGNOSIS — R3129 Other microscopic hematuria: Secondary | ICD-10-CM | POA: Diagnosis not present

## 2015-12-14 DIAGNOSIS — N210 Calculus in bladder: Secondary | ICD-10-CM | POA: Diagnosis not present

## 2015-12-14 DIAGNOSIS — R338 Other retention of urine: Secondary | ICD-10-CM | POA: Diagnosis not present

## 2015-12-18 DIAGNOSIS — H25812 Combined forms of age-related cataract, left eye: Secondary | ICD-10-CM | POA: Diagnosis not present

## 2016-01-11 DIAGNOSIS — H25811 Combined forms of age-related cataract, right eye: Secondary | ICD-10-CM | POA: Diagnosis not present

## 2016-01-11 DIAGNOSIS — H2511 Age-related nuclear cataract, right eye: Secondary | ICD-10-CM | POA: Diagnosis not present

## 2016-02-05 DIAGNOSIS — Z23 Encounter for immunization: Secondary | ICD-10-CM | POA: Diagnosis not present

## 2016-02-05 DIAGNOSIS — Z6841 Body Mass Index (BMI) 40.0 and over, adult: Secondary | ICD-10-CM | POA: Diagnosis not present

## 2016-02-05 DIAGNOSIS — E1140 Type 2 diabetes mellitus with diabetic neuropathy, unspecified: Secondary | ICD-10-CM | POA: Diagnosis not present

## 2016-02-05 DIAGNOSIS — I10 Essential (primary) hypertension: Secondary | ICD-10-CM | POA: Diagnosis not present

## 2016-02-05 DIAGNOSIS — E119 Type 2 diabetes mellitus without complications: Secondary | ICD-10-CM | POA: Diagnosis not present

## 2016-02-20 ENCOUNTER — Other Ambulatory Visit: Payer: Self-pay | Admitting: Interventional Cardiology

## 2016-02-29 DIAGNOSIS — M47816 Spondylosis without myelopathy or radiculopathy, lumbar region: Secondary | ICD-10-CM | POA: Diagnosis not present

## 2016-03-07 DIAGNOSIS — Z961 Presence of intraocular lens: Secondary | ICD-10-CM | POA: Diagnosis not present

## 2016-03-07 DIAGNOSIS — H5203 Hypermetropia, bilateral: Secondary | ICD-10-CM | POA: Diagnosis not present

## 2016-03-07 DIAGNOSIS — Z9849 Cataract extraction status, unspecified eye: Secondary | ICD-10-CM | POA: Diagnosis not present

## 2016-03-07 DIAGNOSIS — H2512 Age-related nuclear cataract, left eye: Secondary | ICD-10-CM | POA: Diagnosis not present

## 2016-03-07 DIAGNOSIS — H52223 Regular astigmatism, bilateral: Secondary | ICD-10-CM | POA: Diagnosis not present

## 2016-03-07 DIAGNOSIS — H25812 Combined forms of age-related cataract, left eye: Secondary | ICD-10-CM | POA: Diagnosis not present

## 2016-03-19 ENCOUNTER — Other Ambulatory Visit: Payer: Self-pay | Admitting: Interventional Cardiology

## 2016-03-20 NOTE — Telephone Encounter (Signed)
Yes, please. Thanks!

## 2016-03-20 NOTE — Telephone Encounter (Signed)
Should this be deferred to pcp as patient is prn follow up? Please advise. Thanks, MI

## 2016-03-28 ENCOUNTER — Other Ambulatory Visit: Payer: Self-pay | Admitting: Interventional Cardiology

## 2016-03-28 NOTE — Telephone Encounter (Signed)
diltiazem (TIAZAC) 360 MG 24 hr capsule  Medication  Date: 02/20/2016 Department: [place] Ordering/Authorizing: Belva Crome, MD  Order Providers   Prescribing Provider Encounter Provider  Belva Crome, MD Belva Crome, MD  Medication Detail    Disp Refills Start End   diltiazem [facility] 360 MG 24 hr capsule 30 capsule 0 02/20/2016    Sig: TAKE 1 CAPSULE BY MOUTH EVERY DAY   Notes to Pharmacy: Please call our office to schedule an yearly appointment for December for future refills. [phone]. Thank you 1st attempt   E-Prescribing Status: Receipt confirmed by pharmacy (02/20/2016 3:50 PM EST)   Pharmacy   [id number] - , [place]

## 2016-04-02 ENCOUNTER — Ambulatory Visit (INDEPENDENT_AMBULATORY_CARE_PROVIDER_SITE_OTHER): Payer: Medicare Other | Admitting: Physician Assistant

## 2016-04-02 VITALS — BP 138/62 | HR 67 | Temp 98.4°F | Ht 73.0 in | Wt 294.0 lb

## 2016-04-02 DIAGNOSIS — H1031 Unspecified acute conjunctivitis, right eye: Secondary | ICD-10-CM

## 2016-04-02 DIAGNOSIS — T148XXA Other injury of unspecified body region, initial encounter: Secondary | ICD-10-CM | POA: Diagnosis not present

## 2016-04-02 MED ORDER — ERYTHROMYCIN 5 MG/GM OP OINT: 1.0000 "application " | TOPICAL_OINTMENT | Freq: Every day | OPHTHALMIC | 0 refills | Status: DC

## 2016-04-02 NOTE — Progress Notes (Signed)
Benjamin Shepard  MRN: [id number] DOB: [birth year]  Subjective:  Benjamin Shepard is a 71 y.o. male seen in office today for a chief complaint of right eye redness and swelling x 3 days. Had a bump in the corner of his right eye 3 days ago when he got up.  Had swelling and redness in corner of eye. Notes he wears a cpap mask and thinks it was too tight the night before this appeared. Yesterday his right eye was injected when he woke up. Has associated itching on upper eyelid, watery discharge, and blurred vision due to excessive watering.Denies acute injury, foreign body sensation, photophobia, and use of contacts. Had cataract surgery in right eye 6 weeks ago. He is following up with opthalmologist, Dr. Sabra Heck,  on 04/04/16.   Review of Systems  Constitutional: Negative for chills, diaphoresis, fatigue and fever.  HENT: Negative for congestion, ear pain, rhinorrhea, sinus pain, sinus pressure and sore throat.   Allergic/Immunologic: Negative for environmental allergies.    Patient Active Problem List   Diagnosis Date Noted  . BPH (benign prostatic hypertrophy) with urinary obstruction 05/31/2015  . Dyspnea 04/14/2015  . Abnormal nuclear cardiac imaging test 04/14/2015  . HTN (hypertension)   . Edema   . OSA (obstructive sleep apnea)   . Diabetes ([place])   . Gout     Current Outpatient Prescriptions on File Prior to Visit  Medication Sig Dispense Refill  . allopurinol (ZYLOPRIM) 100 MG tablet Take 100 mg by mouth daily.    Marland Kitchen amLODipine (NORVASC) 5 MG tablet Take 5 mg by mouth at bedtime.     Marland Kitchen atorvastatin (LIPITOR) 20 MG tablet Take 20 mg by mouth daily.    . colchicine 0.6 MG tablet Take 0.6 mg by mouth daily as needed (gout).     Marland Kitchen diltiazem (TIAZAC) 360 MG 24 hr capsule Take 1 capsule (360 mg total) by mouth daily. 15 capsule 0  . doxazosin (CARDURA) 8 MG tablet TAKE 2 TABLETS BY MOUTH EVERY DAY 60 tablet 6  . furosemide (LASIX) 20 MG tablet Take 20 mg by mouth daily as needed  (leg swelling).    Marland Kitchen HYDROcodone-acetaminophen (NORCO) 5-325 MG tablet Take 1-2 tablets by mouth every 6 (six) hours as needed. 30 tablet 0  . losartan-hydrochlorothiazide (HYZAAR) 100-25 MG tablet TAKE 1 TABLET BY MOUTH DAILY 30 tablet 5  . metFORMIN (GLUCOPHAGE) 500 MG tablet Take 500 mg by mouth 2 (two) times daily with a meal.    . sulfamethoxazole-trimethoprim (BACTRIM DS,SEPTRA DS) 800-160 MG tablet Take 1 tablet by mouth 2 (two) times daily. Start the day prior to foley removal appointment 6 tablet 0   No current facility-administered medications on file prior to visit.     Allergies  Allergen Reactions  . Shrimp [Shellfish Allergy]     "throat swelling, SOB"  . Other Rash    Muscle relaxer        Objective:  BP 138/62 (BP Location: Right Arm, Patient Position: Sitting, Cuff Size: Large)   Pulse 67   Temp 98.4 F (36.9 C) (Oral)   Ht 6\' 1"  (1.854 m)   Wt 294 lb (133.4 kg)   SpO2 95%   BMI 38.79 kg/m   Physical Exam  Constitutional: He is oriented to person, place, and time and well-developed, well-nourished, and in no distress.  HENT:  Head: Normocephalic and atraumatic.  Eyes: Right eye visual fields normal. EOM are normal. Pupils are equal, round, and reactive to light. Right eye exhibits discharge (  watery ). Right eye exhibits no hordeolum. Right conjunctiva is injected.    Neck: Normal range of motion.  Pulmonary/Chest: Effort normal.  Lymphadenopathy:       Head (right side): No submental, no submandibular, no tonsillar, no preauricular, no posterior auricular and no occipital adenopathy present.       Head (left side): No submental, no submandibular, no tonsillar, no preauricular, no posterior auricular and no occipital adenopathy present.    He has no cervical adenopathy.       Right: No supraclavicular adenopathy present.       Left: No supraclavicular adenopathy present.  Neurological: He is alert and oriented to person, place, and time. He has normal  sensation. Gait normal.  Skin: Skin is warm and dry.  Psychiatric: Affect normal.  Vitals reviewed.   Visual Acuity Screening   Right eye Left eye Both eyes  Without correction: 20/25 20/30 20/30   With correction:       Assessment and Plan :  This case was precepted with Dr. Carlota Raspberry.   1. Acute conjunctivitis of right eye, unspecified acute conjunctivitis type 2. Skin abrasion -Will cover for bacterial etiology due to recent cataract surgery. Pt instructed to follow up with ophthalmologist on 04/04/16. Instructed to return to clinic if symptoms worsen, do not improve, or as needed - erythromycin ophthalmic ointment; Place 1 application into the right eye at bedtime.  Dispense: 3.5 g; Refill: 0   Tenna Delaine PA-C  [facility] 04/02/2016 7:29 PM

## 2016-04-02 NOTE — Patient Instructions (Addendum)
Use ointment on affected eye once daily.  Follow up with opthalmologist on Thursday.  If symptoms worsen seek care sooner.  Thank you for letting me participate in your health and well being.  Bacterial Conjunctivitis Introduction Bacterial conjunctivitis is an infection of your conjunctiva. This is the clear membrane that covers the white part of your eye and the inner surface of your eyelid. This condition can make your eye:  Red or pink.  Itchy. This condition is caused by bacteria. This condition spreads very easily from person to person (is contagious) and from one eye to the other eye. Follow these instructions at home: Medicines  Take or apply your antibiotic medicine as told by your doctor. Do not stop taking or applying the antibiotic even if you start to feel better.  Take or apply over-the-counter and prescription medicines only as told by your doctor.  Do not touch your eyelid with the eye drop bottle or the ointment tube. Managing discomfort  Wipe any fluid from your eye with a warm, wet washcloth or a cotton ball.  Place a cool, clean washcloth on your eye. Do this for 10-20 minutes, 3-4 times per day. General instructions  Do not wear contact lenses until the irritation is gone. Wear glasses until your doctor says it is okay to wear contacts.  Do not wear eye makeup until your symptoms are gone. Throw away any old makeup.  Change or wash your pillowcase every day.  Do not share towels or washcloths with anyone.  Wash your hands often with soap and water. Use paper towels to dry your hands.  Do not touch or rub your eyes.  Do not drive or use heavy machinery if your vision is blurry. Contact a doctor if:  You have a fever.  Your symptoms do not get better after 10 days. Get help right away if:  You have a fever and your symptoms suddenly get worse.  You have very bad pain when you move your eye.  Your face:  Hurts.  Is red.  Is swollen.  You  have sudden loss of vision. This information is not intended to replace advice given to you by your health care provider. Make sure you discuss any questions you have with your health care provider. Document Released: 12/26/2007 Document Revised: 08/24/2015 Document Reviewed: 12/29/2014  2017 Elsevier    IF you received an x-ray today, you will receive an invoice from [facility]. Please contact [facility] at [phone] with questions or concerns regarding your invoice.   IF you received labwork today, you will receive an invoice from [facility]. Please contact [facility] at [phone] with questions or concerns regarding your invoice.   Our billing staff will not be able to assist you with questions regarding bills from these companies.  You will be contacted with the lab results as soon as they are available. The fastest way to get your results is to activate your My Chart account. Instructions are located on the last page of this paperwork. If you have not heard from [place] regarding the results in 2 weeks, please contact this office.

## 2016-04-11 ENCOUNTER — Other Ambulatory Visit: Payer: Self-pay | Admitting: Interventional Cardiology

## 2016-04-15 ENCOUNTER — Other Ambulatory Visit: Payer: Self-pay | Admitting: *Deleted

## 2016-04-15 ENCOUNTER — Telehealth: Payer: Self-pay | Admitting: Interventional Cardiology

## 2016-04-15 MED ORDER — DILTIAZEM HCL ER BEADS 360 MG PO CP24: 360.0000 mg | ORAL_CAPSULE | Freq: Every day | ORAL | 0 refills | Status: DC

## 2016-04-15 NOTE — Telephone Encounter (Signed)
New Message    *STAT* If patient is at the pharmacy, call can be transferred to refill team.   1. Which medications need to be refilled? (please list name of each medication and dose if known) diltiazem (TIAZAC) 360 MG 24 hr capsule  2. Which pharmacy/location (including street and city if local pharmacy) is medication to be sent to? [facility] on [address], [place] 3. Do they need a 30 day or 90 day supply? 30 day  Pharmacy only gave him 15 pills, and said he needed to see doctor to get full supply. Appt made for 02/21/17 with Dr. Tamala Julian

## 2016-04-23 ENCOUNTER — Encounter: Payer: Self-pay | Admitting: Interventional Cardiology

## 2016-04-23 ENCOUNTER — Ambulatory Visit (INDEPENDENT_AMBULATORY_CARE_PROVIDER_SITE_OTHER): Payer: Medicare Other | Admitting: Interventional Cardiology

## 2016-04-23 VITALS — BP 124/62 | HR 76 | Ht 73.0 in | Wt 297.6 lb

## 2016-04-23 DIAGNOSIS — I10 Essential (primary) hypertension: Secondary | ICD-10-CM | POA: Diagnosis not present

## 2016-04-23 DIAGNOSIS — N138 Other obstructive and reflux uropathy: Secondary | ICD-10-CM

## 2016-04-23 DIAGNOSIS — N401 Enlarged prostate with lower urinary tract symptoms: Secondary | ICD-10-CM | POA: Diagnosis not present

## 2016-04-23 DIAGNOSIS — G4733 Obstructive sleep apnea (adult) (pediatric): Secondary | ICD-10-CM | POA: Diagnosis not present

## 2016-04-23 DIAGNOSIS — R600 Localized edema: Secondary | ICD-10-CM

## 2016-04-23 NOTE — Patient Instructions (Signed)
Medication Instructions:  1) DISCONTINUE Amlodipine  Labwork: None  Testing/Procedures: None  Follow-Up: Your physician wants you to follow-up in: 1 year with Dr. Tamala Julian. You will receive a reminder letter in the mail two months in advance. If you don't receive a letter, please call our office to schedule the follow-up appointment.   Any Other Special Instructions Will Be Listed Below (If Applicable).  Please call our office in 2 weeks with Blood pressure readings so we can make a decision on keeping you off of the Amlodipine.  Monitor your blood pressure daily, 2 hours after you take your medication.    If you need a refill on your cardiac medications before your next appointment, please call your pharmacy.

## 2016-04-23 NOTE — Progress Notes (Signed)
Cardiology Office Note    Date:  04/23/2016   ID:  Andrez Spiva, DOB 07/04/1945, MRN [id number]  PCP:  Velna Hatchet, MD  Cardiologist: [name] Grooms, MD   Chief Complaint  Patient presents with  . Coronary Artery Disease  . Follow-up    History of Present Illness:  Benjamin Shepard is a 71 y.o. male here for follow-up of hypertension, hyperlipidemia, family history of coronary artery disease, prostate CA, type 2 diabetes mellitus, and diastolic left ventricular dysfunction. He has no personal history of significant coronary disease.  He is doing well. He has no cardiac complaints. He had no cardiac issues at the time of prostatectomy. I did question him concerning his current medical regimen. He is on both Norvasc and diltiazem.   Past Medical History:  Diagnosis Date  . BPH (benign prostatic hyperplasia)   . Diabetes ([place])   . Edema   . Foley catheter in place   . Gout   . HTN (hypertension)   . Hyperlipidemia   . OSA (obstructive sleep apnea)    USES C-PAP  . Psoriasis   . Shortness of breath dyspnea    WITH EXERTION  . Urinary retention     Past Surgical History:  Procedure Laterality Date  . BACK SURGERY     AGE 20  . CARDIAC CATHETERIZATION N/A 04/14/2015   Procedure: Left Heart Cath and Coronary Angiography;  Surgeon: Belva Crome, MD;  Location: [facility];  Service: Cardiovascular;  Laterality: N/A;  . ROBOT ASSISTED INGUINAL HERNIA REPAIR Bilateral 05/31/2015   Procedure: ROBOT ASSISTED INGUINAL HERNIA REPAIR;  Surgeon: Alexis Frock, MD;  Location: [facility];  Service: Urology;  Laterality: Bilateral;  . XI ROBOTIC ASSISTED SIMPLE PROSTATECTOMY N/A 05/31/2015   Procedure: XI ROBOTIC ASSISTED SIMPLE PROSTATECTOMY;  Surgeon: Alexis Frock, MD;  Location: [facility];  Service: Urology;  Laterality: N/A;    Current Medications: Outpatient Medications Prior to Visit  Medication Sig Dispense Refill  . allopurinol (ZYLOPRIM) 100 MG tablet Take 100  mg by mouth daily.    Marland Kitchen amLODipine (NORVASC) 5 MG tablet Take 5 mg by mouth at bedtime.     Marland Kitchen atorvastatin (LIPITOR) 20 MG tablet Take 20 mg by mouth daily.    . baclofen (LIORESAL) 10 MG tablet Take 10 mg by mouth 3 (three) times daily as needed for muscle spasms.     . colchicine 0.6 MG tablet Take 0.6 mg by mouth daily as needed (gout).     . furosemide (LASIX) 20 MG tablet Take 20 mg by mouth daily as needed (leg swelling).    Marland Kitchen losartan-hydrochlorothiazide (HYZAAR) 100-25 MG tablet TAKE 1 TABLET BY MOUTH DAILY 30 tablet 5  . metFORMIN (GLUCOPHAGE) 500 MG tablet Take 500 mg by mouth 2 (two) times daily with a meal.    . diltiazem (TIAZAC) 360 MG 24 hr capsule Take 1 capsule (360 mg total) by mouth daily. *Patient needs to keep 04/23/16 appointment for further refills* (Patient not taking: Reported on 04/23/2016) 30 capsule 0  . doxazosin (CARDURA) 8 MG tablet Take 2 tablets (16 mg total) by mouth daily. *Patient is overdue for an appointment. Please call and schedule for further refills* (Patient not taking: Reported on 04/23/2016) 60 tablet 0  . erythromycin ophthalmic ointment Place 1 application into the right eye at bedtime. (Patient not taking: Reported on 04/23/2016) 3.5 g 0  . escitalopram (LEXAPRO) 10 MG tablet TK 1 T PO QD  5  . HYDROcodone-acetaminophen ([place])  5-325 MG tablet Take 1-2 tablets by mouth every 6 (six) hours as needed. (Patient not taking: Reported on 04/23/2016) 30 tablet 0  . oxyCODONE-acetaminophen (PERCOCET/ROXICET) 5-325 MG tablet TK 1 TO 2 TS PO Q 4 TO 6 HOURS PRN P.  0   No facility-administered medications prior to visit.      Allergies:   Shrimp [shellfish allergy] and Other   Social History   Social History  . Marital status: Married    Spouse name: N/A  . Number of children: N/A  . Years of education: N/A   Social History Main Topics  . Smoking status: Former Smoker    Types: Cigarettes    Quit date: 03/15/1998  . Smokeless tobacco: Never Used  .  Alcohol use Yes     Comment: rare wine, beer  . Drug use: No  . Sexual activity: Not Asked   Other Topics Concern  . None   Social History Narrative  . None     Family History:  The patient's family history includes Heart Problems in his mother; Heart disease in his brother, brother, and mother; Heart failure in his mother; Hypertension in his father.   ROS:   Please see the history of present illness.    Complains of anxiety but otherwise no complaints. Unable to lose weight.  All other systems reviewed and are negative.   PHYSICAL EXAM:   VS:  BP 124/62 (BP Location: Left Arm)   Pulse 76   Ht 6\' 1"  (1.854 m)   Wt 297 lb 9.6 oz (135 kg)   BMI 39.26 kg/m    GEN: Well nourished, well developed, in no acute distress  HEENT: normal  Neck: no JVD, carotid bruits, or masses Cardiac: RRR; no murmurs, rubs, or gallops,no edema  Respiratory:  clear to auscultation bilaterally, normal work of breathing GI: soft, nontender, nondistended, + BS MS: no deformity or atrophy  Skin: warm and dry, no rash Neuro:  Alert and Oriented x 3, Strength and sensation are intact Psych: euthymic mood, full affect  Wt Readings from Last 3 Encounters:  04/23/16 297 lb 9.6 oz (135 kg)  04/02/16 294 lb (133.4 kg)  05/31/15 291 lb 0.1 oz (132 kg)      Studies/Labs Reviewed:   EKG:  EKG No new data.  Recent Labs: 05/22/2015: Platelets 203 06/01/2015: BUN 12; Creatinine, Ser 1.00; Hemoglobin 11.3; Potassium 3.4; Sodium 143   Lipid Panel No results found for: CHOL, TRIG, HDL, CHOLHDL, VLDL, LDLCALC, LDLDIRECT  Additional studies/ records that were reviewed today include:  No recent diagnostic or functional studies.    ASSESSMENT:    1. Essential hypertension   2. OSA (obstructive sleep apnea)   3. Benign prostatic hyperplasia with urinary obstruction   4. Localized edema      PLAN:  In order of problems listed above:  1. 2 g sodium diet, weight loss, and discontinuation of  amlodipine is recommended today. He is on diltiazem and to use amlodipine at the same time is somewhat redundant. We will see his blood pressure does off therapy. For 2 weeks after discontinuing amlodipine he will record his blood pressure at least 2 hours after his morning meds. We will make a final decision about whether the medication can be permanently discontinued. 2. I encouraged continued use of CPAP, which he advocates. 3. Not addressed 4. Addressed and not currently present. This problem will be removed from the list.    Medication Adjustments/Labs and Tests Ordered: Current medicines are  reviewed at length with the patient today.  Concerns regarding medicines are outlined above.  Medication changes, Labs and Tests ordered today are listed in the Patient Instructions below. There are no Patient Instructions on file for this visit.   Signed, [name] Grooms, MD  04/23/2016 11:50 AM    [address], [place], [place]  [address] Phone: [phone]; Fax: [phone]

## 2016-05-11 ENCOUNTER — Other Ambulatory Visit: Payer: Self-pay | Admitting: Interventional Cardiology

## 2016-05-13 ENCOUNTER — Other Ambulatory Visit: Payer: Self-pay | Admitting: Interventional Cardiology

## 2016-05-29 ENCOUNTER — Telehealth: Payer: Self-pay | Admitting: Interventional Cardiology

## 2016-05-29 NOTE — Telephone Encounter (Signed)
Will route to Dr. Tamala Julian for review and advisement.  Pt was seen on 04/23/16 and Amlodipine was d/c'ed.  Pt was told to call us with readings so a decision can be made if ok to stay off Amlodipine.

## 2016-05-29 NOTE — Telephone Encounter (Signed)
New message      Pt c/o BP issue: STAT if pt c/o blurred vision, one-sided weakness or slurred speech  1. What are your last 5 BP readings? Jan 24 139/66 HR 64; jan 25 154/81 HR 69; jan 26 138/73 HR 67; jan 27 130/72 HR 60; jan 30 152/74 HR 50; jan  31 129/65 HR 48; feb 1 124/63 HR 58; feb 2 118/62 HR 57; feb 5 124/64 HR 52; feb 7 123/64 HR 57; feb 13 123/61 HR 49; feb 15 103/56 HR 57; feb 16 119/59 HR 54; feb 20 129/67 HR 49; feb 27 119/57 HR 53 2. Are you having any other symptoms (ex. Dizziness, headache, blurred vision, passed out)? no 3. What is your BP issue?  Calling to give bp readings

## 2016-05-30 NOTE — Telephone Encounter (Signed)
Spoke with pt and made him aware BP's are ok off of Amlodipine.  Advised to continue monitoring BP and let us know if BP becomes elevated consistently.  Pt appreciative for call.

## 2016-05-30 NOTE — Telephone Encounter (Signed)
Seems okay off Amlodipine.

## 2016-06-10 ENCOUNTER — Encounter: Payer: Self-pay | Admitting: *Deleted

## 2016-06-14 ENCOUNTER — Other Ambulatory Visit: Payer: Self-pay | Admitting: Interventional Cardiology

## 2016-06-27 ENCOUNTER — Encounter: Payer: Self-pay | Admitting: Internal Medicine

## 2016-07-03 DIAGNOSIS — M109 Gout, unspecified: Secondary | ICD-10-CM | POA: Diagnosis not present

## 2016-07-03 DIAGNOSIS — Z125 Encounter for screening for malignant neoplasm of prostate: Secondary | ICD-10-CM | POA: Diagnosis not present

## 2016-07-03 DIAGNOSIS — E784 Other hyperlipidemia: Secondary | ICD-10-CM | POA: Diagnosis not present

## 2016-07-03 DIAGNOSIS — E1140 Type 2 diabetes mellitus with diabetic neuropathy, unspecified: Secondary | ICD-10-CM | POA: Diagnosis not present

## 2016-07-03 DIAGNOSIS — I10 Essential (primary) hypertension: Secondary | ICD-10-CM | POA: Diagnosis not present

## 2016-07-10 DIAGNOSIS — Z0000 Encounter for general adult medical examination without abnormal findings: Secondary | ICD-10-CM | POA: Diagnosis not present

## 2016-07-10 DIAGNOSIS — R0609 Other forms of dyspnea: Secondary | ICD-10-CM | POA: Diagnosis not present

## 2016-07-10 DIAGNOSIS — E1121 Type 2 diabetes mellitus with diabetic nephropathy: Secondary | ICD-10-CM | POA: Diagnosis not present

## 2016-07-10 DIAGNOSIS — E298 Other testicular dysfunction: Secondary | ICD-10-CM | POA: Diagnosis not present

## 2016-07-10 DIAGNOSIS — I10 Essential (primary) hypertension: Secondary | ICD-10-CM | POA: Diagnosis not present

## 2016-07-10 DIAGNOSIS — M5489 Other dorsalgia: Secondary | ICD-10-CM | POA: Diagnosis not present

## 2016-07-10 DIAGNOSIS — G4733 Obstructive sleep apnea (adult) (pediatric): Secondary | ICD-10-CM | POA: Diagnosis not present

## 2016-07-10 DIAGNOSIS — Z6841 Body Mass Index (BMI) 40.0 and over, adult: Secondary | ICD-10-CM | POA: Diagnosis not present

## 2016-07-10 DIAGNOSIS — Z1389 Encounter for screening for other disorder: Secondary | ICD-10-CM | POA: Diagnosis not present

## 2016-07-10 DIAGNOSIS — R600 Localized edema: Secondary | ICD-10-CM | POA: Diagnosis not present

## 2016-07-10 DIAGNOSIS — E1140 Type 2 diabetes mellitus with diabetic neuropathy, unspecified: Secondary | ICD-10-CM | POA: Diagnosis not present

## 2016-07-10 DIAGNOSIS — Z23 Encounter for immunization: Secondary | ICD-10-CM | POA: Diagnosis not present

## 2016-10-29 ENCOUNTER — Encounter: Payer: Self-pay | Admitting: Sports Medicine

## 2016-10-29 ENCOUNTER — Ambulatory Visit (INDEPENDENT_AMBULATORY_CARE_PROVIDER_SITE_OTHER): Payer: Medicare Other | Admitting: Sports Medicine

## 2016-10-29 DIAGNOSIS — S91209D Unspecified open wound of unspecified toe(s) with damage to nail, subsequent encounter: Secondary | ICD-10-CM

## 2016-10-29 DIAGNOSIS — Z6841 Body Mass Index (BMI) 40.0 and over, adult: Secondary | ICD-10-CM | POA: Diagnosis not present

## 2016-10-29 DIAGNOSIS — E119 Type 2 diabetes mellitus without complications: Secondary | ICD-10-CM

## 2016-10-29 DIAGNOSIS — E298 Other testicular dysfunction: Secondary | ICD-10-CM | POA: Diagnosis not present

## 2016-10-29 DIAGNOSIS — E784 Other hyperlipidemia: Secondary | ICD-10-CM | POA: Diagnosis not present

## 2016-10-29 DIAGNOSIS — L0390 Cellulitis, unspecified: Secondary | ICD-10-CM | POA: Diagnosis not present

## 2016-10-29 DIAGNOSIS — M79675 Pain in left toe(s): Secondary | ICD-10-CM | POA: Diagnosis not present

## 2016-10-29 DIAGNOSIS — E1140 Type 2 diabetes mellitus with diabetic neuropathy, unspecified: Secondary | ICD-10-CM | POA: Diagnosis not present

## 2016-10-29 DIAGNOSIS — I10 Essential (primary) hypertension: Secondary | ICD-10-CM | POA: Diagnosis not present

## 2016-10-29 NOTE — Patient Instructions (Signed)
Betadine Soak Instructions  Purchase an 8 oz. bottle of BETADINE solution (Povidone)  THE DAY AFTER THE PROCEDURE  Place 1 tablespoon of betadine solution in a quart of warm tap water.  Submerge your foot or feet with outer bandage intact for the initial soak; this will allow the bandage to become moist and wet for easy lift off.  Once you remove your bandage, continue to soak in the solution for 20 minutes.  This soak should be done twice a day.  Next, remove your foot or feet from solution, blot dry the affected area and cover.  You may use a band aid large enough to cover the area or use gauze and tape.  Apply other medications to the area as directed by the doctor such as cortisporin otic solution (ear drops) or neosporin.  IF YOUR SKIN BECOMES IRRITATED WHILE USING THESE INSTRUCTIONS, IT IS OKAY TO SWITCH TO EPSOM SALTS AND WATER OR WHITE VINEGAR AND WATER.  [place] Instructions-Post Nail Surgery  You have had your ingrown toenail and root treated with a chemical.  This chemical causes a burn that will drain and ooze like a blister.  This can drain for 6-8 weeks or longer.  It is important to keep this area clean, covered, and follow the soaking instructions dispensed at the time of your surgery.  This area will eventually dry and form a scab.  Once the scab forms you no longer need to soak or apply a dressing.  If at any time you experience an increase in pain, redness, swelling, or drainage, you should contact the office as soon as possible.   Soak Instructions    THE DAY AFTER THE PROCEDURE  Place 1/4 cup of epsom salts in a quart of warm tap water.  Submerge your foot or feet with outer bandage intact for the initial soak; this will allow the bandage to become moist and wet for easy lift off.  Once you remove your bandage, continue to soak in the solution for 20 minutes.  This soak should be done twice a day.  Next, remove your foot or feet from solution, blot dry the affected  area and cover.  You may use a band aid large enough to cover the area or use gauze and tape.  Apply other medications to the area as directed by the doctor such as polysporin neosporin.  IF YOUR SKIN BECOMES IRRITATED WHILE USING THESE INSTRUCTIONS, IT IS OKAY TO SWITCH TO  WHITE VINEGAR AND WATER. Or you may use antibacterial soap and water to keep the toe clean  Monitor for any signs/symptoms of infection. Call the office immediately if any occur or go directly to the emergency room. Call with any questions/concerns.    [place] Instructions-Post Nail Surgery  You have had your ingrown toenail and root treated with a chemical.  This chemical causes a burn that will drain and ooze like a blister.  This can drain for 6-8 weeks or longer.  It is important to keep this area clean, covered, and follow the soaking instructions dispensed at the time of your surgery.  This area will eventually dry and form a scab.  Once the scab forms you no longer need to soak or apply a dressing.  If at any time you experience an increase in pain, redness, swelling, or drainage, you should contact the office as soon as possible.

## 2016-10-29 NOTE — Progress Notes (Signed)
Subjective: Perfecto Purdy is a 71 y.o. male patient presents to office today complaining of a painful, red, hot, swollen left 2nd toenail. This has been present for 1 week after catching nail. Patient has treated this by peroxide and pcp gave Doxycycline that he started today. Patient denies fever/chills/nausea/vomitting/any other related constitutional symptoms at this time.  Patient Active Problem List   Diagnosis Date Noted  . Benign prostatic hyperplasia with urinary obstruction 05/31/2015  . Dyspnea 04/14/2015  . Abnormal nuclear cardiac imaging test 04/14/2015  . HTN (hypertension)   . Edema   . OSA (obstructive sleep apnea)   . Diabetes ([place])   . Gout     Current Outpatient Prescriptions on File Prior to Visit  Medication Sig Dispense Refill  . allopurinol (ZYLOPRIM) 100 MG tablet Take 100 mg by mouth daily.    Marland Kitchen atorvastatin (LIPITOR) 20 MG tablet Take 20 mg by mouth daily.    . colchicine 0.6 MG tablet Take 0.6 mg by mouth daily as needed (gout).     Marland Kitchen diltiazem (TIAZAC) 360 MG 24 hr capsule TAKE 1 CAPSULE(360 MG) BY MOUTH DAILY 30 capsule 11  . doxazosin (CARDURA) 8 MG tablet TAKE 2 TABLETS(16 MG) BY MOUTH DAILY 60 tablet 10  . escitalopram (LEXAPRO) 10 MG tablet Take 10 mg by mouth daily.    . furosemide (LASIX) 20 MG tablet Take 20 mg by mouth daily as needed (leg swelling).    Marland Kitchen losartan-hydrochlorothiazide (HYZAAR) 100-25 MG tablet TAKE 1 TABLET BY MOUTH DAILY 30 tablet 9  . metFORMIN (GLUCOPHAGE) 500 MG tablet Take 500 mg by mouth 2 (two) times daily with a meal.    . oxyCODONE-acetaminophen (PERCOCET/ROXICET) 5-325 MG tablet Take 1-2 tablets by mouth every 4 to 6 hours as needed for pain.    . baclofen (LIORESAL) 10 MG tablet Take 10 mg by mouth 3 (three) times daily as needed for muscle spasms.     Marland Kitchen HYDROcodone-acetaminophen (NORCO/VICODIN) 5-325 MG tablet Take 1-2 tablets by mouth every 6 (six) hours as needed for moderate pain.     No current  facility-administered medications on file prior to visit.     Allergies  Allergen Reactions  . Shrimp [Shellfish Allergy]     "throat swelling, SOB"  . Other Rash    Muscle relaxer    Objective:  There were no vitals filed for this visit.  General: Well developed, nourished, in no acute distress, alert and oriented x3   Dermatology: Skin is warm, dry and supple bilateral. Left 2nd toenail appears to be partially attached. (+) Erythema. (+) Edema. (-) serosanguous  drainage present. The remaining nails appear unremarkable at this time. There are no open sores, lesions or other signs of infection  present.  Vascular: Dorsalis Pedis artery and Posterior Tibial artery pedal pulses are 1/4 bilateral with immedate capillary fill time. Pedal hair growth present. No lower extremity edema.   Neruologic: Grossly intact via light touch bilateral.  Musculoskeletal: Tenderness to palpation of the left 2nd toe. Muscular strength within normal limits in all groups bilateral.   Assesement and Plan: Problem List Items Addressed This Visit    None    Visit Diagnoses    Traumatic avulsion of nail plate of toe, subsequent encounter    -  Primary   partial left 2nd toe   Toe pain, left       Diabetes mellitus without complication ([place])         -Discussed treatment alternatives and plan of care; Explained  temporary nail avulsion and post procedure course to patient. - After a verbal consent, injected 3 ml of a 50:50 mixture of 2% plain  lidocaine and 0.5% plain marcaine in a normal digital block fashion. Next, a  betadine prep was performed. Anesthesia was tested and found to be appropriate.  The offending left 2nd toenail was then incised from the hyponychium to the epinychium and cleared from the field. The area was curretted for any remaining nail or spicules and the area was then flushed with alcohol and dressed with antibiotic cream and a dry sterile dressing. -Patient was instructed to  leave the dressing intact for today and begin soaking in a weak solution of betadine or Epsom salt and water tomorrow. Patient was instructed to soak for 15 minutes each day and apply neosporin and a gauze or bandaid dressing each day. -Patient was instructed to monitor the toe for signs of infection and return to office if toe becomes red, hot or swollen. Continue with Doxycyline.  -Advised ice, elevation, and tylenol or motrin if needed for pain.  -Patient is to return in 2 weeks for follow up care/nail check or sooner if problems arise.  Landis Martins, DPM

## 2016-10-29 NOTE — Progress Notes (Signed)
   Subjective:    Patient ID: Benjamin Shepard, male    DOB: [birth year], 71 y.o.   MRN: [id number]  HPI   I caught my left 2nd toenail on something about a week ago it is now red and lifting from the toe.  I saw my MD today he gave me doxy and got me in here to see you today.     Review of Systems  All other systems reviewed and are negative.      Objective:   Physical Exam        Assessment & Plan:

## 2016-10-31 ENCOUNTER — Telehealth: Payer: Self-pay | Admitting: Sports Medicine

## 2016-10-31 ENCOUNTER — Telehealth: Payer: Self-pay

## 2016-10-31 NOTE — Telephone Encounter (Signed)
I have questions about after care from a nail procedure I had done this past Tuesday 31 July. I was told to do epsom salt soaks but the nurse/healthcare giver that comes by here said its not good for a diabetic to do the epsom salt soaks. I am diabetic and want to make sure this is okay or should I do something else.

## 2016-10-31 NOTE — Telephone Encounter (Signed)
Advised patient that it is safe to soak in epsom salt as directed by his doctor, if the area was sensitive to epsom salt then he could switch to soapy water soaks. There is no contraindication for soaking in epsom salt and diabetes as long as it is done as directed by the doctor

## 2016-11-12 ENCOUNTER — Ambulatory Visit (INDEPENDENT_AMBULATORY_CARE_PROVIDER_SITE_OTHER): Payer: Medicare Other | Admitting: Sports Medicine

## 2016-11-12 DIAGNOSIS — Z9889 Other specified postprocedural states: Secondary | ICD-10-CM

## 2016-11-12 DIAGNOSIS — E119 Type 2 diabetes mellitus without complications: Secondary | ICD-10-CM

## 2016-11-12 DIAGNOSIS — M79675 Pain in left toe(s): Secondary | ICD-10-CM

## 2016-11-12 NOTE — Progress Notes (Signed)
Subjective: Benjamin Shepard is a 71 y.o. male patient presents to office today for follow-up evaluation after nail removal on left second toe. Patient states that he completed doxycycline and has been doing soaking with Epsom salt with much improvement in toe. Patient denies any pain or acute symptoms at left second toe. Patient denies fever/chills/nausea/vomitting/any other related constitutional symptoms at this time.  Patient Active Problem List   Diagnosis Date Noted  . Benign prostatic hyperplasia with urinary obstruction 05/31/2015  . Dyspnea 04/14/2015  . Abnormal nuclear cardiac imaging test 04/14/2015  . HTN (hypertension)   . Edema   . OSA (obstructive sleep apnea)   . Diabetes ([place])   . Gout     Current Outpatient Prescriptions on File Prior to Visit  Medication Sig Dispense Refill  . allopurinol (ZYLOPRIM) 100 MG tablet Take 100 mg by mouth daily.    Marland Kitchen atorvastatin (LIPITOR) 20 MG tablet Take 20 mg by mouth daily.    . baclofen (LIORESAL) 10 MG tablet Take 10 mg by mouth 3 (three) times daily as needed for muscle spasms.     . colchicine 0.6 MG tablet Take 0.6 mg by mouth daily as needed (gout).     Marland Kitchen diltiazem (TIAZAC) 360 MG 24 hr capsule TAKE 1 CAPSULE(360 MG) BY MOUTH DAILY 30 capsule 11  . doxazosin (CARDURA) 8 MG tablet TAKE 2 TABLETS(16 MG) BY MOUTH DAILY 60 tablet 10  . doxycycline (VIBRA-TABS) 100 MG tablet     . escitalopram (LEXAPRO) 10 MG tablet Take 10 mg by mouth daily.    . furosemide (LASIX) 20 MG tablet Take 20 mg by mouth daily as needed (leg swelling).    Marland Kitchen HYDROcodone-acetaminophen (NORCO/VICODIN) 5-325 MG tablet Take 1-2 tablets by mouth every 6 (six) hours as needed for moderate pain.    Marland Kitchen losartan-hydrochlorothiazide (HYZAAR) 100-25 MG tablet TAKE 1 TABLET BY MOUTH DAILY 30 tablet 9  . metFORMIN (GLUCOPHAGE) 500 MG tablet Take 500 mg by mouth 2 (two) times daily with a meal.    . oxyCODONE-acetaminophen (PERCOCET/ROXICET) 5-325 MG tablet Take 1-2  tablets by mouth every 4 to 6 hours as needed for pain.     No current facility-administered medications on file prior to visit.     Allergies  Allergen Reactions  . Shrimp [Shellfish Allergy]     "throat swelling, SOB"  . Other Rash    Muscle relaxer    Objective:  There were no vitals filed for this visit.  General: Well developed, nourished, in no acute distress, alert and oriented x3   Dermatology: Skin is warm, dry and supple bilateral. Left 2nd toenail appears to be healing well with minor scab to nail bed. There is no active drainage, decreased edema, decreased erythema. No other acute signs of infection. All other nails are asymptomatic at this time.   Vascular: Dorsalis Pedis artery and Posterior Tibial artery pedal pulses are 1/4 bilateral with immedate capillary fill time. Pedal hair growth present. No lower extremity edema.   Neruologic: Grossly intact via light touch bilateral.  Musculoskeletal: There is no tenderness to palpation of the left 2nd toe. Muscular strength within normal limits in all groups bilateral.   Assesement and Plan: Problem List Items Addressed This Visit    None    Visit Diagnoses    S/P nail surgery    -  Primary   Toe pain, left       Diabetes mellitus without complication ([place])         -Patient  seen and evaluated -Left second toe is healing well at this time. Patient may discontinue Epson salt soaks and may leave open to air -Recommend patient to continue with good supportive shoes to prevent irritation at left second toe that is newly healed -Advised patient if there are any changes to toes to call office or come in sooner. Otherwise, patient to return in 3 months for routine diabetic foot exam and nail care.  Landis Martins, DPM

## 2017-01-14 ENCOUNTER — Other Ambulatory Visit: Payer: Self-pay | Admitting: Interventional Cardiology

## 2017-01-15 ENCOUNTER — Other Ambulatory Visit: Payer: Self-pay | Admitting: Interventional Cardiology

## 2017-01-15 NOTE — Telephone Encounter (Signed)
Ok to refill Diltiazem 360mg  , not 300mg .  Thanks!

## 2017-01-15 NOTE — Telephone Encounter (Signed)
We have received a rx for both 300 mg and 360 mg of Diltiazem/Tiazac. I do not see were patient was ever on 300 mg of Diltiazem/Tiazac. Diltiazem 360 mg was sent in 05/2016 with 1 year's worth of refills. Please advise.

## 2017-02-10 ENCOUNTER — Other Ambulatory Visit: Payer: Self-pay | Admitting: Interventional Cardiology

## 2017-02-11 ENCOUNTER — Ambulatory Visit (INDEPENDENT_AMBULATORY_CARE_PROVIDER_SITE_OTHER): Payer: Medicare Other | Admitting: Sports Medicine

## 2017-02-11 ENCOUNTER — Encounter: Payer: Self-pay | Admitting: Sports Medicine

## 2017-02-11 DIAGNOSIS — M79674 Pain in right toe(s): Secondary | ICD-10-CM

## 2017-02-11 DIAGNOSIS — B351 Tinea unguium: Secondary | ICD-10-CM | POA: Diagnosis not present

## 2017-02-11 DIAGNOSIS — M79675 Pain in left toe(s): Secondary | ICD-10-CM

## 2017-02-11 DIAGNOSIS — E119 Type 2 diabetes mellitus without complications: Secondary | ICD-10-CM | POA: Diagnosis not present

## 2017-02-11 NOTE — Progress Notes (Signed)
Subjective: Benjamin Shepard is a 71 y.o. male patient with history of diabetes who presents to office today complaining of long, painful nails  while ambulating in shoes; unable to trim. Patient states that the glucose reading this morning was good. Patient denies any new changes in medication or new problems. Patient denies any new cramping, numbness, burning or tingling in the legs.  Patient Active Problem List   Diagnosis Date Noted  . Benign prostatic hyperplasia with urinary obstruction 05/31/2015  . Dyspnea 04/14/2015  . Abnormal nuclear cardiac imaging test 04/14/2015  . HTN (hypertension)   . Edema   . OSA (obstructive sleep apnea)   . Diabetes ([place])   . Gout    Current Outpatient Medications on File Prior to Visit  Medication Sig Dispense Refill  . allopurinol (ZYLOPRIM) 100 MG tablet Take 100 mg by mouth daily.    Marland Kitchen atorvastatin (LIPITOR) 20 MG tablet Take 20 mg by mouth daily.    . baclofen (LIORESAL) 10 MG tablet Take 10 mg by mouth 3 (three) times daily as needed for muscle spasms.     . colchicine 0.6 MG tablet Take 0.6 mg by mouth daily as needed (gout).     Marland Kitchen diltiazem (TIAZAC) 360 MG 24 hr capsule TAKE 1 CAPSULE BY MOUTH EVERY DAY 90 capsule 0  . doxazosin (CARDURA) 8 MG tablet TAKE 2 TABLETS(16 MG) BY MOUTH DAILY 60 tablet 10  . doxycycline (VIBRA-TABS) 100 MG tablet     . escitalopram (LEXAPRO) 10 MG tablet Take 10 mg by mouth daily.    . furosemide (LASIX) 20 MG tablet Take 20 mg by mouth daily as needed (leg swelling).    Marland Kitchen HYDROcodone-acetaminophen (NORCO/VICODIN) 5-325 MG tablet Take 1-2 tablets by mouth every 6 (six) hours as needed for moderate pain.    Marland Kitchen losartan-hydrochlorothiazide (HYZAAR) 100-25 MG tablet TAKE 1 TABLET BY MOUTH DAILY 30 tablet 9  . metFORMIN (GLUCOPHAGE) 500 MG tablet Take 500 mg by mouth 2 (two) times daily with a meal.    . oxyCODONE-acetaminophen (PERCOCET/ROXICET) 5-325 MG tablet Take 1-2 tablets by mouth every 4 to 6 hours as needed  for pain.     No current facility-administered medications on file prior to visit.    Allergies  Allergen Reactions  . Shrimp [Shellfish Allergy]     "throat swelling, SOB"  . Other Rash    Muscle relaxer    No results found for this or any previous visit (from the past 2160 hour(s)).  Objective: General: Patient is awake, alert, and oriented x 3 and in no acute distress.  Integument: Skin is warm, dry and supple bilateral. Nails are tender, long, thickened and dystrophic with subungual debris, consistent with onychomycosis, 1-5 bilateral except left 2nd toenail where there is no nail from previous avulsion procedure. No signs of infection. No open lesions or preulcerative lesions present bilateral. Remaining integument unremarkable.  Vasculature:  Dorsalis Pedis pulse 1/4 bilateral. Posterior Tibial pulse  1/4 bilateral. Capillary fill time <3 sec 1-5 bilateral. Positive hair growth to the level of the digits. Temperature gradient within normal limits. No varicosities present bilateral. No edema present bilateral.   Neurology: The patient has intact sensation measured with a 5.07/10g Semmes Weinstein Monofilament at all pedal sites bilateral. Vibratory sensation diminished bilateral with tuning fork. No Babinski sign present bilateral.   Musculoskeletal: Asymptomatic hammertoe and planus pedal deformities noted bilateral. Muscular strength 5/5 in all lower extremity muscular groups bilateral without pain on range of motion . No tenderness with  calf compression bilateral.  Assessment and Plan: Problem List Items Addressed This Visit    None    Visit Diagnoses    Pain due to onychomycosis of toenails of both feet    -  Primary   Diabetes mellitus without complication ([place])          -Examined patient. -Discussed and educated patient on diabetic foot care, especially with  regards to the vascular, neurological and musculoskeletal systems.  -Stressed the importance of good glycemic  control and the detriment of not  controlling glucose levels in relation to the foot. -Mechanically debrided all nails x9  using sterile nail nipper and filed with dremel without incident  -Answered all patient questions -Patient to return  in 3 months for at risk foot care -Patient advised to call the office if any problems or questions arise in the meantime.  Landis Martins, DPM

## 2017-03-17 DIAGNOSIS — Z23 Encounter for immunization: Secondary | ICD-10-CM | POA: Diagnosis not present

## 2017-04-04 ENCOUNTER — Other Ambulatory Visit: Payer: Self-pay | Admitting: Interventional Cardiology

## 2017-04-07 ENCOUNTER — Other Ambulatory Visit: Payer: Self-pay | Admitting: Interventional Cardiology

## 2017-04-17 DIAGNOSIS — Z6841 Body Mass Index (BMI) 40.0 and over, adult: Secondary | ICD-10-CM | POA: Diagnosis not present

## 2017-04-17 DIAGNOSIS — G4733 Obstructive sleep apnea (adult) (pediatric): Secondary | ICD-10-CM | POA: Diagnosis not present

## 2017-04-17 DIAGNOSIS — E7849 Other hyperlipidemia: Secondary | ICD-10-CM | POA: Diagnosis not present

## 2017-04-17 DIAGNOSIS — I10 Essential (primary) hypertension: Secondary | ICD-10-CM | POA: Diagnosis not present

## 2017-04-17 DIAGNOSIS — E1121 Type 2 diabetes mellitus with diabetic nephropathy: Secondary | ICD-10-CM | POA: Diagnosis not present

## 2017-04-30 NOTE — Progress Notes (Signed)
Cardiology Office Note    Date:  05/01/2017   ID:  Benjamin Shepard, DOB [birth year], MRN [id number]  PCP:  Velna Hatchet, MD  Cardiologist: [name] Grooms, MD   Chief Complaint  Patient presents with  . Coronary Artery Disease    History of Present Illness:  Benjamin Shepard is a 72 y.o. male has no hx of CAD. He does have hypertension,  obstructive sleep apnea and diabetes.  + FH of CAD  He is doing well.  He denies angina.  He denies orthopnea and PND.  No episodes of edema, PND, palpitation, syncope, or transient neurological symptoms.   Past Medical History:  Diagnosis Date  . BPH (benign prostatic hyperplasia)   . Diabetes ([place])   . Edema   . Foley catheter in place   . Gout   . HTN (hypertension)   . Hyperlipidemia   . OSA (obstructive sleep apnea)    USES C-PAP  . Psoriasis   . Shortness of breath dyspnea    WITH EXERTION  . Urinary retention     Past Surgical History:  Procedure Laterality Date  . BACK SURGERY     AGE 89  . CARDIAC CATHETERIZATION N/A 04/14/2015   Procedure: Left Heart Cath and Coronary Angiography;  Surgeon: Belva Crome, MD;  Location: [facility];  Service: Cardiovascular;  Laterality: N/A;  . ROBOT ASSISTED INGUINAL HERNIA REPAIR Bilateral 05/31/2015   Procedure: ROBOT ASSISTED INGUINAL HERNIA REPAIR;  Surgeon: Alexis Frock, MD;  Location: [facility];  Service: Urology;  Laterality: Bilateral;  . XI ROBOTIC ASSISTED SIMPLE PROSTATECTOMY N/A 05/31/2015   Procedure: XI ROBOTIC ASSISTED SIMPLE PROSTATECTOMY;  Surgeon: Alexis Frock, MD;  Location: [facility];  Service: Urology;  Laterality: N/A;    Current Medications: Outpatient Medications Prior to Visit  Medication Sig Dispense Refill  . allopurinol (ZYLOPRIM) 100 MG tablet Take 100 mg by mouth daily.    Marland Kitchen atorvastatin (LIPITOR) 20 MG tablet Take 20 mg by mouth daily.    . colchicine 0.6 MG tablet Take 0.6 mg by mouth daily as needed (gout).     Marland Kitchen diltiazem (TIAZAC) 360 MG 24  hr capsule TAKE 1 CAPSULE BY MOUTH EVERY DAY 90 capsule 0  . doxazosin (CARDURA) 8 MG tablet TAKE 2 TABLETS(16 MG) BY MOUTH DAILY 180 tablet 0  . escitalopram (LEXAPRO) 10 MG tablet Take 10 mg by mouth daily.    . furosemide (LASIX) 20 MG tablet Take 20 mg by mouth daily as needed (leg swelling).    Marland Kitchen losartan-hydrochlorothiazide (HYZAAR) 100-25 MG tablet Take 1 tablet by mouth daily. Please keep upcoming appt for future refills. Thank you 90 tablet 0  . metFORMIN (GLUCOPHAGE) 500 MG tablet Take 500 mg by mouth 2 (two) times daily with a meal.    . oxyCODONE-acetaminophen (PERCOCET/ROXICET) 5-325 MG tablet Take 1-2 tablets by mouth every 4 to 6 hours as needed for pain.    . baclofen (LIORESAL) 10 MG tablet Take 10 mg by mouth 3 (three) times daily as needed for muscle spasms.     Marland Kitchen doxycycline (VIBRA-TABS) 100 MG tablet     . HYDROcodone-acetaminophen (NORCO/VICODIN) 5-325 MG tablet Take 1-2 tablets by mouth every 6 (six) hours as needed for moderate pain.     No facility-administered medications prior to visit.      Allergies:   Shrimp [shellfish allergy] and Other   Social History   Socioeconomic History  . Marital status: Married    Spouse name: None  .  Number of children: None  . Years of education: None  . Highest education level: None  Social Needs  . Financial resource strain: None  . Food insecurity - worry: None  . Food insecurity - inability: None  . Transportation needs - medical: None  . Transportation needs - non-medical: None  Occupational History  . None  Tobacco Use  . Smoking status: Former Smoker    Types: Cigarettes    Last attempt to quit: 03/15/1998    Years since quitting: 19.1  . Smokeless tobacco: Never Used  Substance and Sexual Activity  . Alcohol use: Yes    Comment: rare wine, beer  . Drug use: No  . Sexual activity: None  Other Topics Concern  . None  Social History Narrative  . None     Family History:  The patient's family history  includes Heart Problems in his mother; Heart disease in his brother, brother, and mother; Heart failure in his mother; Hypertension in his father.   ROS:   Please see the history of present illness.    Physical inactivity.  Low back discomfort.  CPAP equipment is not fitting well.  Having a difficult time obtaining updated equipment. All other systems reviewed and are negative.   PHYSICAL EXAM:   VS:  BP 138/76   Pulse 61   Ht 6\' 1"  (1.854 m)   Wt (!) 335 lb 9.6 oz (152.2 kg)   BMI 44.28 kg/m    GEN: Well nourished, well developed, in no acute distress.  Morbidly obese. HEENT: normal  Neck: no JVD, carotid bruits, or masses Cardiac: RRR; 1-2/6 right upper sternal border systolic murmur from the aortic valve.No rubs, or gallops,no edema . Respiratory:  clear to auscultation bilaterally, normal work of breathing GI: soft, nontender, nondistended, + BS MS: no deformity or atrophy  Skin: warm and dry, no rash Neuro:  Alert and Oriented x 3, Strength and sensation are intact Psych: euthymic mood, full affect  Wt Readings from Last 3 Encounters:  05/01/17 (!) 335 lb 9.6 oz (152.2 kg)  04/23/16 297 lb 9.6 oz (135 kg)  04/02/16 294 lb (133.4 kg)      Studies/Labs Reviewed:   EKG:  EKG sinus rhythm with first-degree AV block, incomplete right bundle branch block, and when compared to prior tracings no change has occurred (03/31/15).  Recent Labs: No results found for requested labs within last 8760 hours.   Lipid Panel No results found for: CHOL, TRIG, HDL, CHOLHDL, VLDL, LDLCALC, LDLDIRECT  Additional studies/ records that were reviewed today include:  None    ASSESSMENT:    1. Essential hypertension   2. Abnormal nuclear cardiac imaging test   3. Type 2 diabetes mellitus with other circulatory complication, with long-term current use of insulin (HCC)      PLAN:  In order of problems listed above:  1. Blood pressure is adequately controlled.  Would like to maintain  less than 140/90 mmHg.. 2. No complaints to suggest CAD.  We will continue to follow along.  Significant risk factors for CAD.  Risk factor modification as much as possible including LDL target less than 70, blood pressure control, and A1c less than 7. 3. Not addressed  Discussed caloric reduction/weight loss, increase aerobic activity, reporting any chest complaints.  Change in therapy.     Medication Adjustments/Labs and Tests Ordered: Current medicines are reviewed at length with the patient today.  Concerns regarding medicines are outlined above.  Medication changes, Labs and Tests ordered today  are listed in the Patient Instructions below. There are no Patient Instructions on file for this visit.   Signed, [name] Grooms, MD  05/01/2017 1:32 PM    [facility] [address], [place], [place]  [address] Phone: [phone]; Fax: [phone]

## 2017-05-01 ENCOUNTER — Encounter: Payer: Self-pay | Admitting: Interventional Cardiology

## 2017-05-01 ENCOUNTER — Encounter (INDEPENDENT_AMBULATORY_CARE_PROVIDER_SITE_OTHER): Payer: Self-pay

## 2017-05-01 ENCOUNTER — Ambulatory Visit (INDEPENDENT_AMBULATORY_CARE_PROVIDER_SITE_OTHER): Payer: Medicare Other | Admitting: Interventional Cardiology

## 2017-05-01 VITALS — BP 138/76 | HR 61 | Ht 73.0 in | Wt 335.6 lb

## 2017-05-01 DIAGNOSIS — R931 Abnormal findings on diagnostic imaging of heart and coronary circulation: Secondary | ICD-10-CM

## 2017-05-01 DIAGNOSIS — Z794 Long term (current) use of insulin: Secondary | ICD-10-CM | POA: Diagnosis not present

## 2017-05-01 DIAGNOSIS — E1159 Type 2 diabetes mellitus with other circulatory complications: Secondary | ICD-10-CM | POA: Diagnosis not present

## 2017-05-01 DIAGNOSIS — I10 Essential (primary) hypertension: Secondary | ICD-10-CM

## 2017-05-01 NOTE — Patient Instructions (Signed)

## 2017-05-09 ENCOUNTER — Other Ambulatory Visit: Payer: Self-pay | Admitting: Interventional Cardiology

## 2017-05-20 ENCOUNTER — Encounter: Payer: Self-pay | Admitting: Sports Medicine

## 2017-05-20 ENCOUNTER — Ambulatory Visit (INDEPENDENT_AMBULATORY_CARE_PROVIDER_SITE_OTHER): Payer: Medicare Other | Admitting: Sports Medicine

## 2017-05-20 DIAGNOSIS — E119 Type 2 diabetes mellitus without complications: Secondary | ICD-10-CM | POA: Diagnosis not present

## 2017-05-20 DIAGNOSIS — M79674 Pain in right toe(s): Secondary | ICD-10-CM

## 2017-05-20 DIAGNOSIS — M79675 Pain in left toe(s): Secondary | ICD-10-CM

## 2017-05-20 DIAGNOSIS — B351 Tinea unguium: Secondary | ICD-10-CM

## 2017-05-20 NOTE — Progress Notes (Signed)
Subjective: Benjamin Shepard is a 72 y.o. male patient with history of diabetes who returns to office today complaining of long, painful nails while ambulating in shoes; unable to trim. Patient states that the glucose reading this morning was not recorded and A1c 5 something. Patient denies any new changes in medication or new problems.   Patient Active Problem List   Diagnosis Date Noted  . Benign prostatic hyperplasia with urinary obstruction 05/31/2015  . Dyspnea 04/14/2015  . Abnormal nuclear cardiac imaging test 04/14/2015  . HTN (hypertension)   . Edema   . OSA (obstructive sleep apnea)   . Diabetes ([place])   . Gout    Current Outpatient Medications on File Prior to Visit  Medication Sig Dispense Refill  . allopurinol (ZYLOPRIM) 100 MG tablet Take 100 mg by mouth daily.    Marland Kitchen atorvastatin (LIPITOR) 20 MG tablet Take 20 mg by mouth daily.    . colchicine 0.6 MG tablet Take 0.6 mg by mouth daily as needed (gout).     Marland Kitchen diltiazem (TIAZAC) 360 MG 24 hr capsule TAKE 1 CAPSULE BY MOUTH EVERY DAY 90 capsule 3  . doxazosin (CARDURA) 8 MG tablet TAKE 2 TABLETS(16 MG) BY MOUTH DAILY 180 tablet 0  . escitalopram (LEXAPRO) 10 MG tablet Take 10 mg by mouth daily.    . furosemide (LASIX) 20 MG tablet Take 20 mg by mouth daily as needed (leg swelling).    Marland Kitchen losartan-hydrochlorothiazide (HYZAAR) 100-25 MG tablet Take 1 tablet by mouth daily. Please keep upcoming appt for future refills. Thank you 90 tablet 0  . metFORMIN (GLUCOPHAGE) 500 MG tablet Take 500 mg by mouth 2 (two) times daily with a meal.    . oxyCODONE-acetaminophen (PERCOCET/ROXICET) 5-325 MG tablet Take 1-2 tablets by mouth every 4 to 6 hours as needed for pain.     No current facility-administered medications on file prior to visit.    Allergies  Allergen Reactions  . Shrimp [Shellfish Allergy]     "throat swelling, SOB"  . Other Rash    Muscle relaxer    No results found for this or any previous visit (from the past 2160  hour(s)).  Objective: General: Patient is awake, alert, and oriented x 3 and in no acute distress.  Integument: Skin is warm, dry and supple bilateral. Nails are tender, long, thickened and dystrophic with subungual debris, consistent with onychomycosis, 1-5 bilateral except left 2nd toenail where there is no nail from previous avulsion procedure. No signs of infection. No open lesions or pre-ulcerative lesions present bilateral. Remaining integument unremarkable.  Vasculature:  Dorsalis Pedis pulse 1/4 bilateral. Posterior Tibial pulse  1/4 bilateral. Capillary fill time <3 sec 1-5 bilateral. Positive hair growth to the level of the digits. Temperature gradient within normal limits. No varicosities present bilateral. No edema present bilateral.   Neurology: The patient has intact sensation measured with a 5.07/10g Semmes Weinstein Monofilament at all pedal sites bilateral. Vibratory sensation diminished bilateral with tuning fork. No Babinski sign present bilateral.   Musculoskeletal: Asymptomatic hammertoe and planus pedal deformities noted bilateral. Muscular strength 5/5 in all lower extremity muscular groups bilateral without pain on range of motion . No tenderness with calf compression bilateral.  Assessment and Plan: Problem List Items Addressed This Visit    None    Visit Diagnoses    Pain due to onychomycosis of toenails of both feet    -  Primary   Diabetes mellitus without complication ([place])          -  Examined patient. -Discussed and educated patient on diabetic foot care, especially with  regards to the vascular, neurological and musculoskeletal systems.  -Stressed the importance of good glycemic control and the detriment of not  controlling glucose levels in relation to the foot. -Mechanically debrided all nails x9  using sterile nail nipper and filed with dremel without incident  -ABN signed  -Answered all patient questions -Patient to return  in 3 months for at risk foot  care -Patient advised to call the office if any problems or questions arise in the meantime.  Landis Martins, DPM

## 2017-07-09 ENCOUNTER — Other Ambulatory Visit: Payer: Self-pay | Admitting: Interventional Cardiology

## 2017-07-14 ENCOUNTER — Other Ambulatory Visit: Payer: Self-pay | Admitting: Interventional Cardiology

## 2017-08-19 ENCOUNTER — Ambulatory Visit (INDEPENDENT_AMBULATORY_CARE_PROVIDER_SITE_OTHER): Payer: Medicare Other | Admitting: Sports Medicine

## 2017-08-19 ENCOUNTER — Encounter: Payer: Self-pay | Admitting: Sports Medicine

## 2017-08-19 DIAGNOSIS — B351 Tinea unguium: Secondary | ICD-10-CM | POA: Diagnosis not present

## 2017-08-19 DIAGNOSIS — E119 Type 2 diabetes mellitus without complications: Secondary | ICD-10-CM

## 2017-08-19 DIAGNOSIS — M79674 Pain in right toe(s): Secondary | ICD-10-CM

## 2017-08-19 DIAGNOSIS — M79675 Pain in left toe(s): Secondary | ICD-10-CM

## 2017-08-19 NOTE — Progress Notes (Signed)
Subjective: Benjamin Shepard is a 72 y.o. male patient with history of diabetes who returns to office today complaining of long, painful nails while ambulating in shoes; unable to trim. Patient states that the glucose reading this morning was not recorded and does not recall last A1c. Patient denies any new changes in medication or new problems since lat visit.   Patient Active Problem List   Diagnosis Date Noted  . Benign prostatic hyperplasia with urinary obstruction 05/31/2015  . Dyspnea 04/14/2015  . Abnormal nuclear cardiac imaging test 04/14/2015  . HTN (hypertension)   . Edema   . OSA (obstructive sleep apnea)   . Diabetes ([place])   . Gout    Current Outpatient Medications on File Prior to Visit  Medication Sig Dispense Refill  . allopurinol (ZYLOPRIM) 100 MG tablet Take 100 mg by mouth daily.    Marland Kitchen atorvastatin (LIPITOR) 20 MG tablet Take 20 mg by mouth daily.    . colchicine 0.6 MG tablet Take 0.6 mg by mouth daily as needed (gout).     Marland Kitchen diltiazem (TIAZAC) 360 MG 24 hr capsule TAKE 1 CAPSULE BY MOUTH EVERY DAY 90 capsule 3  . doxazosin (CARDURA) 8 MG tablet TAKE 2 TABLETS(16 MG) BY MOUTH DAILY 180 tablet 2  . escitalopram (LEXAPRO) 10 MG tablet Take 10 mg by mouth daily.    . furosemide (LASIX) 20 MG tablet Take 20 mg by mouth daily as needed (leg swelling).    Marland Kitchen losartan-hydrochlorothiazide (HYZAAR) 100-25 MG tablet TAKE 1 TABLET BY MOUTH EVERY DAY 90 tablet 2  . metFORMIN (GLUCOPHAGE) 500 MG tablet Take 500 mg by mouth 2 (two) times daily with a meal.    . oxyCODONE-acetaminophen (PERCOCET/ROXICET) 5-325 MG tablet Take 1-2 tablets by mouth every 4 to 6 hours as needed for pain.     No current facility-administered medications on file prior to visit.    Allergies  Allergen Reactions  . Shrimp [Shellfish Allergy]     "throat swelling, SOB"  . Other Rash    Muscle relaxer    No results found for this or any previous visit (from the past 2160  hour(s)).  Objective: General: Patient is awake, alert, and oriented x 3 and in no acute distress.  Integument: Skin is warm, dry and supple bilateral. Nails are tender, long, thickened and dystrophic with subungual debris, consistent with onychomycosis, 1-5 bilateral except left 2nd toenail where there is no nail from previous avulsion procedure. No signs of infection. No open lesions or pre-ulcerative lesions present bilateral. Remaining integument unremarkable.  Vasculature:  Dorsalis Pedis pulse 1/4 bilateral. Posterior Tibial pulse  1/4 bilateral. Capillary fill time <3 sec 1-5 bilateral. Positive hair growth to the level of the digits. Temperature gradient within normal limits. No varicosities present bilateral. No edema present bilateral.   Neurology: The patient has intact sensation measured with a 5.07/10g Semmes Weinstein Monofilament at all pedal sites bilateral. Vibratory sensation diminished bilateral with tuning fork. No Babinski sign present bilateral.   Musculoskeletal: Asymptomatic hammertoe and planus pedal deformities noted bilateral. Muscular strength 5/5 in all lower extremity muscular groups bilateral without pain on range of motion . No tenderness with calf compression bilateral.  Assessment and Plan: Problem List Items Addressed This Visit    None    Visit Diagnoses    Pain due to onychomycosis of toenails of both feet    -  Primary   Diabetes mellitus without complication ([place])          -Examined patient. -  Discussed and educated patient on diabetic foot care, especially with  regards to the vascular, neurological and musculoskeletal systems.  -Stressed the importance of good glycemic control and the detriment of not  controlling glucose levels in relation to the foot. -Mechanically debrided all nails x9  using sterile nail nipper and filed with dremel without incident  -Answered all patient questions -Patient to return  in 3 months for at risk foot  care -Patient advised to call the office if any problems or questions arise in the meantime.  Landis Martins, DPM

## 2017-11-05 DIAGNOSIS — M109 Gout, unspecified: Secondary | ICD-10-CM | POA: Diagnosis not present

## 2017-11-05 DIAGNOSIS — R82998 Other abnormal findings in urine: Secondary | ICD-10-CM | POA: Diagnosis not present

## 2017-11-05 DIAGNOSIS — E7849 Other hyperlipidemia: Secondary | ICD-10-CM | POA: Diagnosis not present

## 2017-11-05 DIAGNOSIS — E291 Testicular hypofunction: Secondary | ICD-10-CM | POA: Diagnosis not present

## 2017-11-05 DIAGNOSIS — E1140 Type 2 diabetes mellitus with diabetic neuropathy, unspecified: Secondary | ICD-10-CM | POA: Diagnosis not present

## 2017-11-05 DIAGNOSIS — Z125 Encounter for screening for malignant neoplasm of prostate: Secondary | ICD-10-CM | POA: Diagnosis not present

## 2017-11-12 DIAGNOSIS — E1140 Type 2 diabetes mellitus with diabetic neuropathy, unspecified: Secondary | ICD-10-CM | POA: Diagnosis not present

## 2017-11-12 DIAGNOSIS — N401 Enlarged prostate with lower urinary tract symptoms: Secondary | ICD-10-CM | POA: Diagnosis not present

## 2017-11-12 DIAGNOSIS — M109 Gout, unspecified: Secondary | ICD-10-CM | POA: Diagnosis not present

## 2017-11-12 DIAGNOSIS — G4733 Obstructive sleep apnea (adult) (pediatric): Secondary | ICD-10-CM | POA: Diagnosis not present

## 2017-11-12 DIAGNOSIS — Z1389 Encounter for screening for other disorder: Secondary | ICD-10-CM | POA: Diagnosis not present

## 2017-11-12 DIAGNOSIS — R600 Localized edema: Secondary | ICD-10-CM | POA: Diagnosis not present

## 2017-11-12 DIAGNOSIS — E7849 Other hyperlipidemia: Secondary | ICD-10-CM | POA: Diagnosis not present

## 2017-11-12 DIAGNOSIS — I10 Essential (primary) hypertension: Secondary | ICD-10-CM | POA: Diagnosis not present

## 2017-11-12 DIAGNOSIS — Z0000 Encounter for general adult medical examination without abnormal findings: Secondary | ICD-10-CM | POA: Diagnosis not present

## 2017-11-12 DIAGNOSIS — Z6841 Body Mass Index (BMI) 40.0 and over, adult: Secondary | ICD-10-CM | POA: Diagnosis not present

## 2017-11-12 DIAGNOSIS — E291 Testicular hypofunction: Secondary | ICD-10-CM | POA: Diagnosis not present

## 2017-11-18 ENCOUNTER — Encounter: Payer: Self-pay | Admitting: Podiatry

## 2017-11-18 ENCOUNTER — Ambulatory Visit (INDEPENDENT_AMBULATORY_CARE_PROVIDER_SITE_OTHER): Payer: Medicare Other | Admitting: Podiatry

## 2017-11-18 DIAGNOSIS — E119 Type 2 diabetes mellitus without complications: Secondary | ICD-10-CM

## 2017-11-18 DIAGNOSIS — M79675 Pain in left toe(s): Secondary | ICD-10-CM

## 2017-11-18 DIAGNOSIS — B351 Tinea unguium: Secondary | ICD-10-CM

## 2017-11-18 DIAGNOSIS — M79674 Pain in right toe(s): Secondary | ICD-10-CM

## 2017-11-18 NOTE — Progress Notes (Signed)
Complaint:  Visit Type: Patient returns to my office for continued preventative foot care services. Complaint: Patient states" my nails have grown long and thick and become painful to walk and wear shoes" Patient has been diagnosed with DM with no foot complications. The patient presents for preventative foot care services. No changes to ROS.  Patient is diabetic.    Podiatric Exam: Vascular: dorsalis pedis and posterior tibial pulses are palpable bilateral. Capillary return is immediate. Temperature gradient is WNL. Skin turgor WNL  Sensorium: Normal Semmes Weinstein monofilament test. Normal tactile sensation bilaterally. Nail Exam: Pt has thick disfigured discolored nails with subungual debris noted bilateral entire nail hallux through fifth toenails Ulcer Exam: There is no evidence of ulcer or pre-ulcerative changes or infection. Orthopedic Exam: Muscle tone and strength are WNL. No limitations in general ROM. No crepitus or effusions noted. Foot type and digits show no abnormalities. Bony prominences are unremarkable. Skin: No Porokeratosis. No infection or ulcers  Diagnosis:  Onychomycosis, , Pain in right toe, pain in left toes  Treatment & Plan Procedures and Treatment: Consent by patient was obtained for treatment procedures.   Debridement of mycotic and hypertrophic toenails, 1 through 5 bilateral and clearing of subungual debris. No ulceration, no infection noted. ABN signed for 2019. Return Visit-Office Procedure: Patient instructed to return to the office for a follow up visit 3 months for continued evaluation and treatment.    Bitha Fauteux DPM 

## 2017-11-21 DIAGNOSIS — Z1212 Encounter for screening for malignant neoplasm of rectum: Secondary | ICD-10-CM | POA: Diagnosis not present

## 2018-02-04 ENCOUNTER — Telehealth: Payer: Self-pay | Admitting: Interventional Cardiology

## 2018-02-04 MED ORDER — LOSARTAN POTASSIUM 100 MG PO TABS: 100.0000 mg | ORAL_TABLET | Freq: Every day | ORAL | 3 refills | Status: DC

## 2018-02-04 MED ORDER — HYDROCHLOROTHIAZIDE 25 MG PO TABS: 25.0000 mg | ORAL_TABLET | Freq: Every day | ORAL | 3 refills | Status: DC

## 2018-02-04 NOTE — Telephone Encounter (Signed)
dc'd hyzaar Ordered for patient to have losartan 100 mg daily and hctz 25 mg daily with note to pharmacy.

## 2018-02-04 NOTE — Telephone Encounter (Signed)
New Message:     The drug is on back order. She wants to know if they can do two separate prescriptions.  One for Losartan 10 mg and HCTZ 25 mg.

## 2018-02-17 ENCOUNTER — Ambulatory Visit (INDEPENDENT_AMBULATORY_CARE_PROVIDER_SITE_OTHER): Payer: Medicare Other | Admitting: Podiatry

## 2018-02-17 ENCOUNTER — Encounter: Payer: Self-pay | Admitting: Podiatry

## 2018-02-17 DIAGNOSIS — M79674 Pain in right toe(s): Secondary | ICD-10-CM | POA: Diagnosis not present

## 2018-02-17 DIAGNOSIS — B351 Tinea unguium: Secondary | ICD-10-CM

## 2018-02-17 DIAGNOSIS — E119 Type 2 diabetes mellitus without complications: Secondary | ICD-10-CM

## 2018-02-17 DIAGNOSIS — M79675 Pain in left toe(s): Secondary | ICD-10-CM

## 2018-02-17 NOTE — Progress Notes (Signed)
Complaint:  Visit Type: Patient returns to my office for continued preventative foot care services. Complaint: Patient states" my nails have grown long and thick and become painful to walk and wear shoes" Patient has been diagnosed with DM with no foot complications. The patient presents for preventative foot care services. No changes to ROS.  Patient is diabetic.    Podiatric Exam: Vascular: dorsalis pedis and posterior tibial pulses are palpable bilateral. Capillary return is immediate. Temperature gradient is WNL. Skin turgor WNL  Sensorium: Normal Semmes Weinstein monofilament test. Normal tactile sensation bilaterally. Nail Exam: Pt has thick disfigured discolored nails with subungual debris noted bilateral entire nail hallux through fifth toenails Ulcer Exam: There is no evidence of ulcer or pre-ulcerative changes or infection. Orthopedic Exam: Muscle tone and strength are WNL. No limitations in general ROM. No crepitus or effusions noted. Foot type and digits show no abnormalities. Bony prominences are unremarkable. Skin: No Porokeratosis. No infection or ulcers  Diagnosis:  Onychomycosis, , Pain in right toe, pain in left toes  Treatment & Plan Procedures and Treatment: Consent by patient was obtained for treatment procedures.   Debridement of mycotic and hypertrophic toenails, 1 through 5 bilateral and clearing of subungual debris. No ulceration, no infection noted. ABN signed for 2019. Return Visit-Office Procedure: Patient instructed to return to the office for a follow up visit 3 months for continued evaluation and treatment.    Rikki Trosper DPM 

## 2018-02-18 DIAGNOSIS — Z23 Encounter for immunization: Secondary | ICD-10-CM | POA: Diagnosis not present

## 2018-04-04 ENCOUNTER — Other Ambulatory Visit: Payer: Self-pay | Admitting: Interventional Cardiology

## 2018-05-06 ENCOUNTER — Other Ambulatory Visit: Payer: Self-pay | Admitting: Interventional Cardiology

## 2018-05-06 MED ORDER — DOXAZOSIN MESYLATE 8 MG PO TABS: 16.0000 mg | ORAL_TABLET | Freq: Every day | ORAL | 0 refills | Status: DC

## 2018-05-14 ENCOUNTER — Telehealth: Payer: Self-pay | Admitting: Interventional Cardiology

## 2018-05-14 ENCOUNTER — Other Ambulatory Visit: Payer: Self-pay | Admitting: *Deleted

## 2018-05-14 MED ORDER — DILTIAZEM HCL ER BEADS 360 MG PO CP24: ORAL_CAPSULE | ORAL | 0 refills | Status: DC

## 2018-05-14 NOTE — Telephone Encounter (Signed)
°*  STAT* If patient is at the pharmacy, call can be transferred to refill team.   1. Which medications need to be refilled? (please list name of each medication and dose if known) diltiazem (TIAZAC) 360 MG 24 hr capsule  2. Which pharmacy/location (including street and city if local pharmacy) is medication to be sent to? [facility] [place], [place]  3. Do they need a 30 day or 90 day supply? 90 days

## 2018-05-17 DIAGNOSIS — L03211 Cellulitis of face: Secondary | ICD-10-CM | POA: Diagnosis not present

## 2018-05-19 ENCOUNTER — Encounter: Payer: Self-pay | Admitting: Podiatry

## 2018-05-19 ENCOUNTER — Ambulatory Visit (INDEPENDENT_AMBULATORY_CARE_PROVIDER_SITE_OTHER): Payer: Medicare Other | Admitting: Podiatry

## 2018-05-19 DIAGNOSIS — M79675 Pain in left toe(s): Secondary | ICD-10-CM | POA: Diagnosis not present

## 2018-05-19 DIAGNOSIS — M79674 Pain in right toe(s): Secondary | ICD-10-CM | POA: Diagnosis not present

## 2018-05-19 DIAGNOSIS — E119 Type 2 diabetes mellitus without complications: Secondary | ICD-10-CM | POA: Diagnosis not present

## 2018-05-19 DIAGNOSIS — B351 Tinea unguium: Secondary | ICD-10-CM

## 2018-05-19 NOTE — Progress Notes (Signed)
Complaint:  Visit Type: Patient returns to my office for continued preventative foot care services. Complaint: Patient states" my nails have grown long and thick and become painful to walk and wear shoes" Patient has been diagnosed with DM with no foot complications. The patient presents for preventative foot care services. No changes to ROS.  Patient is diabetic.    Podiatric Exam: Vascular: dorsalis pedis and posterior tibial pulses are palpable bilateral. Capillary return is immediate. Temperature gradient is WNL. Skin turgor WNL  Sensorium: Normal Semmes Weinstein monofilament test. Normal tactile sensation bilaterally. Nail Exam: Pt has thick disfigured discolored nails with subungual debris noted bilateral entire nail hallux through fifth toenails Ulcer Exam: There is no evidence of ulcer or pre-ulcerative changes or infection. Orthopedic Exam: Muscle tone and strength are WNL. No limitations in general ROM. No crepitus or effusions noted. Foot type and digits show no abnormalities. Bony prominences are unremarkable. Skin: No Porokeratosis. No infection or ulcers  Diagnosis:  Onychomycosis, , Pain in right toe, pain in left toes  Treatment & Plan Procedures and Treatment: Consent by patient was obtained for treatment procedures.   Debridement of mycotic and hypertrophic toenails, 1 through 5 bilateral and clearing of subungual debris. No ulceration, no infection noted. ABN signed for 2019. Return Visit-Office Procedure: Patient instructed to return to the office for a follow up visit 3 months for continued evaluation and treatment.    Giavonna Pflum DPM 

## 2018-05-26 DIAGNOSIS — H1089 Other conjunctivitis: Secondary | ICD-10-CM | POA: Diagnosis not present

## 2018-05-26 DIAGNOSIS — N401 Enlarged prostate with lower urinary tract symptoms: Secondary | ICD-10-CM | POA: Diagnosis not present

## 2018-05-26 DIAGNOSIS — E291 Testicular hypofunction: Secondary | ICD-10-CM | POA: Diagnosis not present

## 2018-05-26 DIAGNOSIS — M109 Gout, unspecified: Secondary | ICD-10-CM | POA: Diagnosis not present

## 2018-05-26 DIAGNOSIS — E7849 Other hyperlipidemia: Secondary | ICD-10-CM | POA: Diagnosis not present

## 2018-05-26 DIAGNOSIS — E1140 Type 2 diabetes mellitus with diabetic neuropathy, unspecified: Secondary | ICD-10-CM | POA: Diagnosis not present

## 2018-05-26 DIAGNOSIS — Z6841 Body Mass Index (BMI) 40.0 and over, adult: Secondary | ICD-10-CM | POA: Diagnosis not present

## 2018-06-17 ENCOUNTER — Other Ambulatory Visit: Payer: Self-pay

## 2018-06-17 MED ORDER — DOXAZOSIN MESYLATE 8 MG PO TABS: 16.0000 mg | ORAL_TABLET | Freq: Every day | ORAL | 0 refills | Status: DC

## 2018-07-22 ENCOUNTER — Ambulatory Visit: Payer: Medicare Other | Admitting: Interventional Cardiology

## 2018-08-10 ENCOUNTER — Other Ambulatory Visit: Payer: Self-pay | Admitting: Interventional Cardiology

## 2018-08-10 MED ORDER — DILTIAZEM HCL ER BEADS 360 MG PO CP24: ORAL_CAPSULE | ORAL | 0 refills | Status: DC

## 2018-08-18 ENCOUNTER — Ambulatory Visit: Payer: Medicare Other | Admitting: Podiatry

## 2018-08-19 ENCOUNTER — Other Ambulatory Visit: Payer: Self-pay

## 2018-08-19 ENCOUNTER — Ambulatory Visit (INDEPENDENT_AMBULATORY_CARE_PROVIDER_SITE_OTHER): Payer: Medicare Other | Admitting: Podiatry

## 2018-08-19 ENCOUNTER — Encounter: Payer: Self-pay | Admitting: Podiatry

## 2018-08-19 VITALS — Temp 97.3°F

## 2018-08-19 DIAGNOSIS — B351 Tinea unguium: Secondary | ICD-10-CM | POA: Diagnosis not present

## 2018-08-19 DIAGNOSIS — E119 Type 2 diabetes mellitus without complications: Secondary | ICD-10-CM

## 2018-08-19 DIAGNOSIS — M79674 Pain in right toe(s): Secondary | ICD-10-CM | POA: Diagnosis not present

## 2018-08-19 DIAGNOSIS — M79675 Pain in left toe(s): Secondary | ICD-10-CM

## 2018-08-19 NOTE — Progress Notes (Signed)
Complaint:  Visit Type: Patient returns to my office for continued preventative foot care services. Complaint: Patient states" my nails have grown long and thick and become painful to walk and wear shoes" Patient has been diagnosed with DM with no foot complications. The patient presents for preventative foot care services. No changes to ROS.  Patient is diabetic.    Podiatric Exam: Vascular: dorsalis pedis and posterior tibial pulses are palpable bilateral. Capillary return is immediate. Temperature gradient is WNL. Skin turgor WNL  Sensorium: Normal Semmes Weinstein monofilament test. Normal tactile sensation bilaterally. Nail Exam: Pt has thick disfigured discolored nails with subungual debris noted bilateral entire nail hallux through fifth toenails Ulcer Exam: There is no evidence of ulcer or pre-ulcerative changes or infection. Orthopedic Exam: Muscle tone and strength are WNL. No limitations in general ROM. No crepitus or effusions noted. Foot type and digits show no abnormalities. Bony prominences are unremarkable. Skin: No Porokeratosis. No infection or ulcers  Diagnosis:  Onychomycosis, , Pain in right toe, pain in left toes  Treatment & Plan Procedures and Treatment: Consent by patient was obtained for treatment procedures.   Debridement of mycotic and hypertrophic toenails, 1 through 5 bilateral and clearing of subungual debris. No ulceration, no infection noted. ABN signed for 2019. Return Visit-Office Procedure: Patient instructed to return to the office for a follow up visit 3 months for continued evaluation and treatment.    Gardiner Barefoot DPM

## 2018-09-15 ENCOUNTER — Other Ambulatory Visit: Payer: Self-pay | Admitting: Interventional Cardiology

## 2018-09-15 MED ORDER — DOXAZOSIN MESYLATE 8 MG PO TABS: 16.0000 mg | ORAL_TABLET | Freq: Every day | ORAL | 0 refills | Status: DC

## 2018-11-09 ENCOUNTER — Other Ambulatory Visit: Payer: Self-pay

## 2018-11-09 MED ORDER — DILTIAZEM HCL ER BEADS 360 MG PO CP24: ORAL_CAPSULE | ORAL | 0 refills | Status: DC

## 2018-11-10 DIAGNOSIS — E7849 Other hyperlipidemia: Secondary | ICD-10-CM | POA: Diagnosis not present

## 2018-11-10 DIAGNOSIS — E119 Type 2 diabetes mellitus without complications: Secondary | ICD-10-CM | POA: Diagnosis not present

## 2018-11-10 DIAGNOSIS — Z125 Encounter for screening for malignant neoplasm of prostate: Secondary | ICD-10-CM | POA: Diagnosis not present

## 2018-11-18 ENCOUNTER — Encounter: Payer: Self-pay | Admitting: Interventional Cardiology

## 2018-11-18 ENCOUNTER — Ambulatory Visit (INDEPENDENT_AMBULATORY_CARE_PROVIDER_SITE_OTHER): Payer: Medicare Other | Admitting: Interventional Cardiology

## 2018-11-18 ENCOUNTER — Other Ambulatory Visit: Payer: Self-pay

## 2018-11-18 VITALS — BP 124/64 | HR 71 | Ht 73.0 in | Wt 332.8 lb

## 2018-11-18 DIAGNOSIS — G4733 Obstructive sleep apnea (adult) (pediatric): Secondary | ICD-10-CM

## 2018-11-18 DIAGNOSIS — R0609 Other forms of dyspnea: Secondary | ICD-10-CM

## 2018-11-18 DIAGNOSIS — R931 Abnormal findings on diagnostic imaging of heart and coronary circulation: Secondary | ICD-10-CM | POA: Diagnosis not present

## 2018-11-18 DIAGNOSIS — E1159 Type 2 diabetes mellitus with other circulatory complications: Secondary | ICD-10-CM

## 2018-11-18 DIAGNOSIS — Z794 Long term (current) use of insulin: Secondary | ICD-10-CM

## 2018-11-18 DIAGNOSIS — Z7189 Other specified counseling: Secondary | ICD-10-CM | POA: Diagnosis not present

## 2018-11-18 DIAGNOSIS — I10 Essential (primary) hypertension: Secondary | ICD-10-CM

## 2018-11-18 MED ORDER — LOSARTAN POTASSIUM 100 MG PO TABS: 100.0000 mg | ORAL_TABLET | Freq: Every day | ORAL | 3 refills | Status: DC

## 2018-11-18 MED ORDER — DILTIAZEM HCL ER BEADS 360 MG PO CP24: ORAL_CAPSULE | ORAL | 3 refills | Status: DC

## 2018-11-18 MED ORDER — DOXAZOSIN MESYLATE 8 MG PO TABS: 16.0000 mg | ORAL_TABLET | Freq: Every day | ORAL | 3 refills | Status: DC

## 2018-11-18 MED ORDER — HYDROCHLOROTHIAZIDE 25 MG PO TABS: 25.0000 mg | ORAL_TABLET | Freq: Every day | ORAL | 3 refills | Status: DC

## 2018-11-18 NOTE — Progress Notes (Signed)
Cardiology Office Note:    Date:  11/18/2018   ID:  Benjamin Shepard, DOB 01-02-1946, MRN [id number]  PCP:  Velna Hatchet, MD  Cardiologist:  [name] Grooms, MD   Referring MD: Velna Hatchet, MD   Chief Complaint  Patient presents with   Hypertension   Hyperlipidemia    History of Present Illness:    Benjamin Shepard is a 73 y.o. male with a hx of dyspnea on exertion, type 2 diabetes mellitus, hypertension, hyperlipidemia, and obstructive sleep apnea.  He is being followed for primary prevention purposes given his high risk profile.  He is doing well.  He has no cardiac complaints.  He basically states that he is in here today because he needs to make sure his cardiovascular nurse can be refilled.  We discussed secondary prevention.  His weight is going up and I encouraged him to become more active.  He is somewhat depressed related to the COVID-19 pandemic and his wife's cancer that is causing her to be totally dependent on him for care.  Past Medical History:  Diagnosis Date   BPH (benign prostatic hyperplasia)    Diabetes ([place])    Edema    Foley catheter in place    Gout    HTN (hypertension)    Hyperlipidemia    OSA (obstructive sleep apnea)    USES C-PAP   Psoriasis    Shortness of breath dyspnea    WITH EXERTION   Urinary retention     Past Surgical History:  Procedure Laterality Date   BACK SURGERY     AGE 36   CARDIAC CATHETERIZATION N/A 04/14/2015   Procedure: Left Heart Cath and Coronary Angiography;  Surgeon: Belva Crome, MD;  Location: [facility];  Service: Cardiovascular;  Laterality: N/A;   ROBOT ASSISTED INGUINAL HERNIA REPAIR Bilateral 05/31/2015   Procedure: ROBOT ASSISTED INGUINAL HERNIA REPAIR;  Surgeon: Alexis Frock, MD;  Location: [facility];  Service: Urology;  Laterality: Bilateral;   XI ROBOTIC ASSISTED SIMPLE PROSTATECTOMY N/A 05/31/2015   Procedure: XI ROBOTIC ASSISTED SIMPLE PROSTATECTOMY;  Surgeon: Alexis Frock, MD;  Location: [facility];  Service: Urology;  Laterality: N/A;    Current Medications: Current Meds  Medication Sig   allopurinol (ZYLOPRIM) 100 MG tablet Take 100 mg by mouth daily.   atorvastatin (LIPITOR) 20 MG tablet Take 20 mg by mouth daily.   colchicine 0.6 MG tablet Take 0.6 mg by mouth daily as needed (gout).    diltiazem (TIAZAC) 360 MG 24 hr capsule TAKE 1 CAPSULE BY MOUTH EVERY DAY. Please keep upcoming appointment for further refills   doxazosin (CARDURA) 8 MG tablet Take 2 tablets (16 mg total) by mouth daily. Please keep upcoming appt in August with Dr. Tamala Julian for future refills. Thank you   escitalopram (LEXAPRO) 10 MG tablet Take 10 mg by mouth daily.   furosemide (LASIX) 20 MG tablet Take 20 mg by mouth daily as needed (leg swelling).   hydrochlorothiazide (HYDRODIURIL) 25 MG tablet Take 1 tablet (25 mg total) by mouth daily.   losartan (COZAAR) 100 MG tablet Take 1 tablet (100 mg total) by mouth daily.   metFORMIN (GLUCOPHAGE) 500 MG tablet Take 500 mg by mouth daily with breakfast.    oxyCODONE-acetaminophen (PERCOCET/ROXICET) 5-325 MG tablet Take 1-2 tablets by mouth every 4 to 6 hours as needed for pain.     Allergies:   Shrimp [shellfish allergy] and Other   Social History   Socioeconomic History   Marital status:  Married    Spouse name: Not on file   Number of children: Not on file   Years of education: Not on file   Highest education level: Not on file  Occupational History   Not on file  Social Needs   Financial resource strain: Not on file   Food insecurity    Worry: Not on file    Inability: Not on file   Transportation needs    Medical: Not on file    Non-medical: Not on file  Tobacco Use   Smoking status: Former Smoker    Types: Cigarettes    Quit date: 03/15/1998    Years since quitting: 20.6   Smokeless tobacco: Never Used  Substance and Sexual Activity   Alcohol use: Yes    Comment: rare wine, beer   Drug use:  No   Sexual activity: Not on file  Lifestyle   Physical activity    Days per week: Not on file    Minutes per session: Not on file   Stress: Not on file  Relationships   Social connections    Talks on phone: Not on file    Gets together: Not on file    Attends religious service: Not on file    Active member of club or organization: Not on file    Attends meetings of clubs or organizations: Not on file    Relationship status: Not on file  Other Topics Concern   Not on file  Social History Narrative   Not on file     Family History: The patient's family history includes Heart Problems in his mother; Heart disease in his brother, brother, and mother; Heart failure in his mother; Hypertension in his father. There is no history of Colon cancer.  ROS:   Please see the history of present illness.    Using a walker today.  States that he probably for security since his left leg is hurting and feels somewhat weak.  He is going to the orthopedic office after he leaves here.  All other systems reviewed and are negative.  EKGs/Labs/Other Studies Reviewed:    The following studies were reviewed today:  Recent imaging.  EKG:  EKG formed in January 2019 demonstrated right bundle branch block but otherwise unremarkable.  EKG not done today because patient is unable to lie down due to back and leg pain.  Recent Labs: No results found for requested labs within last 8760 hours.  Recent Lipid Panel No results found for: CHOL, TRIG, HDL, CHOLHDL, VLDL, LDLCALC, LDLDIRECT  Physical Exam:    VS:  BP 124/64    Pulse 71    Ht 6\' 1"  (1.854 m)    Wt (!) 332 lb 12.8 oz (151 kg)    SpO2 96%    BMI 43.91 kg/m     Wt Readings from Last 3 Encounters:  11/18/18 (!) 332 lb 12.8 oz (151 kg)  05/01/17 (!) 335 lb 9.6 oz (152.2 kg)  04/23/16 297 lb 9.6 oz (135 kg)     GEN: Obesity, predominantly abdominal.. No acute distress HEENT: Normal NECK: No JVD. LYMPHATICS: No lymphadenopathy CARDIAC:   RRR without murmur, gallop, or edema. VASCULAR:  Normal Pulses. No bruits. RESPIRATORY:  Clear to auscultation without rales, wheezing or rhonchi  ABDOMEN: Soft, non-tender, non-distended, No pulsatile mass, MUSCULOSKELETAL: No deformity  SKIN: Warm and dry NEUROLOGIC:  Alert and oriented x 3 PSYCHIATRIC:  Normal affect   ASSESSMENT:    1. Dyspnea on exertion  2. Abnormal nuclear cardiac imaging test   3. OSA (obstructive sleep apnea)   4. Essential hypertension   5. Type 2 diabetes mellitus with other circulatory complication, with long-term current use of insulin (HCC)   6. Educated About Covid-19 Virus Infection    PLAN:    In order of problems listed above:  1. No real change.  Certainly were sent no associated orthopnea.  Likely related to physical deconditioning. 2. No symptoms to suggest active ongoing ischemia.  Not using nitroglycerin or having chest discomfort. 3. He is compliant with CPAP. 4. Excellent blood pressure control.  We will maintain the same therapy. 5. Updated A1c was 6.0 last week. 6. Social distancing, masking, and handwashing is stressed.  Overall education and awareness concerning primary/secondary risk prevention was discussed in detail: LDL less than 70, hemoglobin A1c less than 7, blood pressure target less than 130/80 mmHg, >150 minutes of moderate aerobic activity per week, avoidance of smoking, weight control (via diet and exercise), and continued surveillance/management of/for obstructive sleep apnea.    Medication Adjustments/Labs and Tests Ordered: Current medicines are reviewed at length with the patient today.  Concerns regarding medicines are outlined above.  No orders of the defined types were placed in this encounter.  No orders of the defined types were placed in this encounter.   There are no Patient Instructions on file for this visit.   Signed, [name] Grooms, MD  11/18/2018 1:43 PM    [facility]

## 2018-11-18 NOTE — Patient Instructions (Signed)

## 2018-11-20 ENCOUNTER — Other Ambulatory Visit: Payer: Self-pay

## 2018-11-20 ENCOUNTER — Encounter: Payer: Self-pay | Admitting: Podiatry

## 2018-11-20 ENCOUNTER — Ambulatory Visit (INDEPENDENT_AMBULATORY_CARE_PROVIDER_SITE_OTHER): Payer: Medicare Other | Admitting: Podiatry

## 2018-11-20 VITALS — Temp 97.7°F

## 2018-11-20 DIAGNOSIS — M79674 Pain in right toe(s): Secondary | ICD-10-CM | POA: Diagnosis not present

## 2018-11-20 DIAGNOSIS — E119 Type 2 diabetes mellitus without complications: Secondary | ICD-10-CM | POA: Diagnosis not present

## 2018-11-20 DIAGNOSIS — M79675 Pain in left toe(s): Secondary | ICD-10-CM

## 2018-11-20 DIAGNOSIS — M47816 Spondylosis without myelopathy or radiculopathy, lumbar region: Secondary | ICD-10-CM | POA: Diagnosis not present

## 2018-11-20 DIAGNOSIS — B351 Tinea unguium: Secondary | ICD-10-CM | POA: Diagnosis not present

## 2018-11-20 NOTE — Progress Notes (Signed)
Complaint:  Visit Type: Patient returns to my office for continued preventative foot care services. Complaint: Patient states" my nails have grown long and thick and become painful to walk and wear shoes" Patient has been diagnosed with DM with no foot complications. The patient presents for preventative foot care services. No changes to ROS.  Patient is diabetic.  Podiatric Exam: Vascular: dorsalis pedis and posterior tibial pulses are palpable bilateral. Capillary return is immediate. Temperature gradient is WNL. Skin turgor WNL  Sensorium: Normal Semmes Weinstein monofilament test. Normal tactile sensation bilaterally. Nail Exam: Pt has thick disfigured discolored nails with subungual debris noted bilateral entire nail hallux through fifth toenails Ulcer Exam: There is no evidence of ulcer or pre-ulcerative changes or infection. Orthopedic Exam: Muscle tone and strength are WNL. No limitations in general ROM. No crepitus or effusions noted. Foot type and digits show no abnormalities. Bony prominences are unremarkable. Skin: No Porokeratosis. No infection or ulcers  Diagnosis:  Onychomycosis, , Pain in right toe, pain in left toes  Treatment & Plan Procedures and Treatment: Consent by patient was obtained for treatment procedures.   Debridement of mycotic and hypertrophic toenails, 1 through 5 bilateral and clearing of subungual debris. No ulceration, no infection noted.  Return Visit-Office Procedure: Patient instructed to return to the office for a follow up visit 3 months for continued evaluation and treatment.    Amany Rando DPM 

## 2018-11-25 DIAGNOSIS — M545 Low back pain: Secondary | ICD-10-CM | POA: Diagnosis not present

## 2018-11-25 DIAGNOSIS — S39012D Strain of muscle, fascia and tendon of lower back, subsequent encounter: Secondary | ICD-10-CM | POA: Diagnosis not present

## 2018-11-30 DIAGNOSIS — G4733 Obstructive sleep apnea (adult) (pediatric): Secondary | ICD-10-CM | POA: Diagnosis not present

## 2018-11-30 DIAGNOSIS — N401 Enlarged prostate with lower urinary tract symptoms: Secondary | ICD-10-CM | POA: Diagnosis not present

## 2018-11-30 DIAGNOSIS — M5416 Radiculopathy, lumbar region: Secondary | ICD-10-CM | POA: Diagnosis not present

## 2018-11-30 DIAGNOSIS — M109 Gout, unspecified: Secondary | ICD-10-CM | POA: Diagnosis not present

## 2018-11-30 DIAGNOSIS — Z1331 Encounter for screening for depression: Secondary | ICD-10-CM | POA: Diagnosis not present

## 2018-11-30 DIAGNOSIS — Z0000 Encounter for general adult medical examination without abnormal findings: Secondary | ICD-10-CM | POA: Diagnosis not present

## 2018-11-30 DIAGNOSIS — I10 Essential (primary) hypertension: Secondary | ICD-10-CM | POA: Diagnosis not present

## 2018-11-30 DIAGNOSIS — E291 Testicular hypofunction: Secondary | ICD-10-CM | POA: Diagnosis not present

## 2018-12-04 DIAGNOSIS — Z23 Encounter for immunization: Secondary | ICD-10-CM | POA: Diagnosis not present

## 2018-12-04 DIAGNOSIS — E1140 Type 2 diabetes mellitus with diabetic neuropathy, unspecified: Secondary | ICD-10-CM | POA: Diagnosis not present

## 2018-12-04 DIAGNOSIS — R82998 Other abnormal findings in urine: Secondary | ICD-10-CM | POA: Diagnosis not present

## 2019-01-26 ENCOUNTER — Other Ambulatory Visit: Payer: Self-pay | Admitting: Interventional Cardiology

## 2019-01-26 MED ORDER — LOSARTAN POTASSIUM 100 MG PO TABS: 100.0000 mg | ORAL_TABLET | Freq: Every day | ORAL | 2 refills | Status: DC

## 2019-01-26 MED ORDER — HYDROCHLOROTHIAZIDE 25 MG PO TABS: 25.0000 mg | ORAL_TABLET | Freq: Every day | ORAL | 2 refills | Status: DC

## 2019-02-17 ENCOUNTER — Ambulatory Visit: Payer: Medicare Other | Admitting: Podiatry

## 2019-03-10 ENCOUNTER — Ambulatory Visit (INDEPENDENT_AMBULATORY_CARE_PROVIDER_SITE_OTHER): Payer: Medicare Other | Admitting: Podiatry

## 2019-03-10 ENCOUNTER — Encounter: Payer: Self-pay | Admitting: Podiatry

## 2019-03-10 ENCOUNTER — Other Ambulatory Visit: Payer: Self-pay

## 2019-03-10 DIAGNOSIS — M79674 Pain in right toe(s): Secondary | ICD-10-CM

## 2019-03-10 DIAGNOSIS — B351 Tinea unguium: Secondary | ICD-10-CM

## 2019-03-10 DIAGNOSIS — M79675 Pain in left toe(s): Secondary | ICD-10-CM | POA: Diagnosis not present

## 2019-03-10 DIAGNOSIS — E119 Type 2 diabetes mellitus without complications: Secondary | ICD-10-CM | POA: Diagnosis not present

## 2019-03-10 NOTE — Progress Notes (Signed)
Complaint:  Visit Type: Patient returns to my office for continued preventative foot care services. Complaint: Patient states" my nails have grown long and thick and become painful to walk and wear shoes" Patient has been diagnosed with DM with no foot complications. The patient presents for preventative foot care services. No changes to ROS.  Patient is diabetic.  Podiatric Exam: Vascular: dorsalis pedis and posterior tibial pulses are palpable bilateral. Capillary return is immediate. Temperature gradient is WNL. Skin turgor WNL  Sensorium: Normal Semmes Weinstein monofilament test. Normal tactile sensation bilaterally. Nail Exam: Pt has thick disfigured discolored nails with subungual debris noted bilateral entire nail hallux through fifth toenails Ulcer Exam: There is no evidence of ulcer or pre-ulcerative changes or infection. Orthopedic Exam: Muscle tone and strength are WNL. No limitations in general ROM. No crepitus or effusions noted. Foot type and digits show no abnormalities. Bony prominences are unremarkable. Skin: No Porokeratosis. No infection or ulcers  Diagnosis:  Onychomycosis, , Pain in right toe, pain in left toes  Treatment & Plan Procedures and Treatment: Consent by patient was obtained for treatment procedures.   Debridement of mycotic and hypertrophic toenails, 1 through 5 bilateral and clearing of subungual debris. No ulceration, no infection noted.  Return Visit-Office Procedure: Patient instructed to return to the office for a follow up visit 3 months for continued evaluation and treatment.    Darrion Wyszynski DPM 

## 2019-05-28 DIAGNOSIS — Z23 Encounter for immunization: Secondary | ICD-10-CM | POA: Diagnosis not present

## 2019-06-09 ENCOUNTER — Ambulatory Visit (INDEPENDENT_AMBULATORY_CARE_PROVIDER_SITE_OTHER): Payer: Medicare Other | Admitting: Podiatry

## 2019-06-09 ENCOUNTER — Encounter: Payer: Self-pay | Admitting: Podiatry

## 2019-06-09 ENCOUNTER — Other Ambulatory Visit: Payer: Self-pay

## 2019-06-09 DIAGNOSIS — M79675 Pain in left toe(s): Secondary | ICD-10-CM | POA: Diagnosis not present

## 2019-06-09 DIAGNOSIS — B351 Tinea unguium: Secondary | ICD-10-CM

## 2019-06-09 DIAGNOSIS — M79674 Pain in right toe(s): Secondary | ICD-10-CM | POA: Diagnosis not present

## 2019-06-09 DIAGNOSIS — E119 Type 2 diabetes mellitus without complications: Secondary | ICD-10-CM | POA: Diagnosis not present

## 2019-06-09 NOTE — Progress Notes (Signed)
This patient returns to my office for at risk foot care.  This patient requires this care by a professional since this patient will be at risk due to having  diabetes.This patient is unable to cut nails themselves since the patient cannot reach their nails.These nails are painful walking and wearing shoes.  This patient presents for at risk foot care today.  General Appearance  Alert, conversant and in no acute stress.  Vascular  Dorsalis pedis  are palpable  bilaterally. Posterior tibial pulses are not palpable. Capillary return is within normal limits  bilaterally. Temperature is within normal limits  bilaterally.  Neurologic  Senn-Weinstein monofilament wire test within normal limits  bilaterally. Muscle power within normal limits bilaterally.  Nails Thick disfigured discolored nails with subungual debris  from hallux to fifth toes bilaterally. No evidence of bacterial infection or drainage bilaterally.  Orthopedic  No limitations of motion  feet .  No crepitus or effusions noted.  No bony pathology or digital deformities noted.  Mild  HAV  B/L.  Skin  normotropic skin with no porokeratosis noted bilaterally.  No signs of infections or ulcers noted.     Onychomycosis  Pain in right toe  Pain in left toe.  Consent was obtained for treatment procedures.  Debridement and grinding of long thick nails with clearing of subungual debris.  No infection or ulcer.     Return office visit  3 months        Told patient to return for periodic foot care and evaluation due to potential at risk complications.   Iysha Mishkin DPM  

## 2019-06-25 DIAGNOSIS — Z23 Encounter for immunization: Secondary | ICD-10-CM | POA: Diagnosis not present

## 2019-09-08 ENCOUNTER — Ambulatory Visit: Payer: Medicare Other | Admitting: Podiatry

## 2019-09-15 ENCOUNTER — Ambulatory Visit (INDEPENDENT_AMBULATORY_CARE_PROVIDER_SITE_OTHER): Payer: Medicare Other | Admitting: Bariatrics

## 2019-09-15 ENCOUNTER — Encounter (INDEPENDENT_AMBULATORY_CARE_PROVIDER_SITE_OTHER): Payer: Self-pay | Admitting: Bariatrics

## 2019-09-15 ENCOUNTER — Other Ambulatory Visit: Payer: Self-pay

## 2019-09-15 VITALS — BP 160/80 | HR 70 | Temp 98.0°F | Ht 70.0 in | Wt 342.0 lb

## 2019-09-15 DIAGNOSIS — Z1331 Encounter for screening for depression: Secondary | ICD-10-CM

## 2019-09-15 DIAGNOSIS — Z6841 Body Mass Index (BMI) 40.0 and over, adult: Secondary | ICD-10-CM | POA: Diagnosis not present

## 2019-09-15 DIAGNOSIS — E559 Vitamin D deficiency, unspecified: Secondary | ICD-10-CM | POA: Diagnosis not present

## 2019-09-15 DIAGNOSIS — I10 Essential (primary) hypertension: Secondary | ICD-10-CM | POA: Diagnosis not present

## 2019-09-15 DIAGNOSIS — E1169 Type 2 diabetes mellitus with other specified complication: Secondary | ICD-10-CM

## 2019-09-15 DIAGNOSIS — R5383 Other fatigue: Secondary | ICD-10-CM | POA: Diagnosis not present

## 2019-09-15 DIAGNOSIS — E119 Type 2 diabetes mellitus without complications: Secondary | ICD-10-CM | POA: Diagnosis not present

## 2019-09-15 DIAGNOSIS — R0602 Shortness of breath: Secondary | ICD-10-CM | POA: Diagnosis not present

## 2019-09-15 DIAGNOSIS — E785 Hyperlipidemia, unspecified: Secondary | ICD-10-CM

## 2019-09-15 DIAGNOSIS — G4733 Obstructive sleep apnea (adult) (pediatric): Secondary | ICD-10-CM | POA: Diagnosis not present

## 2019-09-15 DIAGNOSIS — Z0289 Encounter for other administrative examinations: Secondary | ICD-10-CM

## 2019-09-16 ENCOUNTER — Encounter (INDEPENDENT_AMBULATORY_CARE_PROVIDER_SITE_OTHER): Payer: Self-pay | Admitting: Bariatrics

## 2019-09-16 DIAGNOSIS — E559 Vitamin D deficiency, unspecified: Secondary | ICD-10-CM | POA: Insufficient documentation

## 2019-09-16 DIAGNOSIS — Z6841 Body Mass Index (BMI) 40.0 and over, adult: Secondary | ICD-10-CM | POA: Insufficient documentation

## 2019-09-16 LAB — COMPREHENSIVE METABOLIC PANEL
ALT: 24 IU/L (ref 0–44)
AST: 20 IU/L (ref 0–40)
Albumin/Globulin Ratio: 2 (ref 1.2–2.2)
Albumin: 4.5 g/dL (ref 3.7–4.7)
Alkaline Phosphatase: 98 IU/L (ref 48–121)
BUN/Creatinine Ratio: 21 (ref 10–24)
BUN: 19 mg/dL (ref 8–27)
Bilirubin Total: 0.5 mg/dL (ref 0.0–1.2)
CO2: 27 mmol/L (ref 20–29)
Calcium: 10.1 mg/dL (ref 8.6–10.2)
Chloride: 99 mmol/L (ref 96–106)
Creatinine, Ser: 0.92 mg/dL (ref 0.76–1.27)
GFR calc Af Amer: 94 mL/min/{1.73_m2} (ref >59)
GFR calc non Af Amer: 81 mL/min/{1.73_m2} (ref >59)
Globulin, Total: 2.3 g/dL (ref 1.5–4.5)
Glucose: 140 mg/dL — ABNORMAL HIGH (ref 65–99)
Potassium: 3.5 mmol/L (ref 3.5–5.2)
Sodium: 141 mmol/L (ref 134–144)
Total Protein: 6.8 g/dL (ref 6.0–8.5)

## 2019-09-16 LAB — TSH: TSH: 2.57 u[IU]/mL (ref 0.450–4.500)

## 2019-09-16 LAB — LIPID PANEL WITH LDL/HDL RATIO
Cholesterol, Total: 126 mg/dL (ref 100–199)
HDL: 46 mg/dL (ref >39)
LDL Chol Calc (NIH): 66 mg/dL (ref 0–99)
LDL/HDL Ratio: 1.4 ratio (ref 0.0–3.6)
Triglycerides: 65 mg/dL (ref 0–149)
VLDL Cholesterol Cal: 14 mg/dL (ref 5–40)

## 2019-09-16 LAB — T4, FREE: Free T4: 1.06 ng/dL (ref 0.82–1.77)

## 2019-09-16 LAB — HEMOGLOBIN A1C
Est. average glucose Bld gHb Est-mCnc: 131 mg/dL
Hgb A1c MFr Bld: 6.2 % — ABNORMAL HIGH (ref 4.8–5.6)

## 2019-09-16 LAB — T3: T3, Total: 115 ng/dL (ref 71–180)

## 2019-09-16 LAB — INSULIN, RANDOM: INSULIN: 23.3 u[IU]/mL (ref 2.6–24.9)

## 2019-09-16 LAB — VITAMIN D 25 HYDROXY (VIT D DEFICIENCY, FRACTURES): Vit D, 25-Hydroxy: 26.6 ng/mL — ABNORMAL LOW (ref 30.0–100.0)

## 2019-09-16 NOTE — Progress Notes (Signed)
Chief Complaint:   OBESITY Benjamin Shepard (MR# [id number]) is a 74 y.o. male who presents for evaluation and treatment of obesity and related comorbidities. Current BMI is Body mass index is 49.07 kg/m.Percell Shepard has been struggling with his weight for many years and has been unsuccessful in either losing weight, maintaining weight loss, or reaching his healthy weight goal.  [name] is currently in the action stage of change and ready to dedicate time achieving and maintaining a healthier weight. [name] is interested in becoming our patient and working on intensive lifestyle modifications including (but not limited to) diet and exercise for weight loss.  [name] is allergic to shrimp. He likes to cook. He craves sweets. He skips lunch.  [name]'s habits were reviewed today and are as follows: his desired weight loss is 142 lbs, he started gaining weight in his 30's, he craves sweets, he snacks sometimes in the evenings, he skips lunch everyday, he is frequently drinking liquids with calories, he frequently eats larger portions than normal and he has binge eating behaviors.  Depression Screen Benjamin Shepard's Food and Mood (modified PHQ-9) score was 6.  Depression screen PHQ 2/9 09/15/2019  Decreased Interest 2  Down, Depressed, Hopeless 1  PHQ - 2 Score 3  Altered sleeping 0  Tired, decreased energy 2  Change in appetite 1  Feeling bad or failure about yourself  0  Trouble concentrating 0  Moving slowly or fidgety/restless 0  Suicidal thoughts 0  PHQ-9 Score 6  Difficult doing work/chores Not difficult at all   Subjective:   Other fatigue. Loy denies daytime somnolence and denies waking up still tired. Benjamin Shepard generally gets 7 hours of sleep per night, and states that he has generally restful sleep. Snoring is present. Apneic episodes are not present. Epworth Sleepiness Score is 6.  SOB (shortness of breath) on exertion. Benjamin Shepard notes increasing shortness of breath with certain  activities and seems to be worsening over time with weight gain. He notes getting out of breath sooner with activity than he used to. This has gotten worse recently. Benjamin Shepard denies shortness of breath at rest or orthopnea.  Essential hypertension. Benjamin Shepard is taking diltiazem, losartan, and HCTZ. Blood pressure is not well controlled.  BP Readings from Last 3 Encounters:  09/15/19 (!) 160/80  11/18/18 124/64  05/01/17 138/76   Lab Results  Component Value Date   CREATININE 1.00 06/01/2015   CREATININE 0.91 05/22/2015   OSA (obstructive sleep apnea). Edwards wears CPAP and reports usually restful sleep.  Type 2 diabetes mellitus without complication, without long-term current use of insulin ([place]). Benjamin Shepard is taking metformin.  Lab Results  Component Value Date   HGBA1C 5.9 (H) 05/22/2015   Hyperlipidemia associated with type 2 diabetes mellitus ([place]). [name] is taking atorvastatin.  Vitamin D deficiency. [name] is not on Vitamin D supplementation.  Depression screening. Parth had a mildly positive depression screen with a PHQ-9 score of 6.  Assessment/Plan:   Other fatigue. Benjamin Shepard does feel that his weight is causing his energy to be lower than it should be. Fatigue may be related to obesity, depression or many other causes. Labs will be ordered, and in the meanwhile, Benjamin Shepard will focus on self care including making healthy food choices, increasing physical activity and focusing on stress reduction. EKG 12-Lead, Comprehensive metabolic panel, T3, T4, free, TSH testing ordered today.  SOB (shortness of breath) on exertion. Benjamin Shepard does feel that he gets out of breath more easily that he used to when he exercises.  Benjamin Shepard's shortness of breath appears to be obesity related and exercise induced. He has agreed to work on weight loss and gradually increase exercise to treat his exercise induced shortness of breath. Will continue to monitor closely. Lipid Panel With LDL/HDL Ratio ordered  today.  Essential hypertension. Benjamin Shepard is working on healthy weight loss and exercise to improve blood pressure control. We will watch for signs of hypotension as he continues his lifestyle modifications. Benjamin Shepard will continue his medications as directed and will check his blood pressure at home.  OSA (obstructive sleep apnea). Intensive lifestyle modifications are the first line treatment for this issue. We discussed several lifestyle modifications today and he will continue to work on diet, exercise and weight loss efforts. We will continue to monitor. Orders and follow up as documented in patient record. Benjamin Shepard will continue to use CPAP nightly.  Counseling  Sleep apnea is a condition in which breathing pauses or becomes shallow during sleep. This happens over and over during the night. This disrupts your sleep and keeps your body from getting the rest that it needs, which can cause tiredness and lack of energy (fatigue) during the day.  Sleep apnea treatment: If you were given a device to open your airway while you sleep, USE IT!  Sleep hygiene:   Limit or avoid alcohol, caffeinated beverages, and cigarettes, especially close to bedtime.   Do not eat a large meal or eat spicy foods right before bedtime. This can lead to digestive discomfort that can make it hard for you to sleep.  Keep a sleep diary to help you and your health care provider figure out what could be causing your insomnia.  . Make your bedroom a dark, comfortable place where it is easy to fall asleep. ? Put up shades or blackout curtains to block light from outside. ? Use a white noise machine to block noise. ? Keep the temperature cool. . Limit screen use before bedtime. This includes: ? Watching TV. ? Using your smartphone, tablet, or computer. . Stick to a routine that includes going to bed and waking up at the same times every day and night. This can help you fall asleep faster. Consider making a quiet activity, such  as reading, part of your nighttime routine. . Try to avoid taking naps during the day so that you sleep better at night. . Get out of bed if you are still awake after 15 minutes of trying to sleep. Keep the lights down, but try reading or doing a quiet activity. When you feel sleepy, go back to bed.  Type 2 diabetes mellitus without complication, without long-term current use of insulin ([place]). Good blood sugar control is important to decrease the likelihood of diabetic complications such as nephropathy, neuropathy, limb loss, blindness, coronary artery disease, and death. Intensive lifestyle modification including diet, exercise and weight loss are the first line of treatment for diabetes. Devine will continue his medication as directed. Hemoglobin A1c, Insulin, random levels ordered today.  Hyperlipidemia associated with type 2 diabetes mellitus ([place]). Cardiovascular risk and specific lipid/LDL goals reviewed.  We discussed several lifestyle modifications today and Mirl will continue to work on diet, exercise and weight loss efforts. Orders and follow up as documented in patient record. Lipid Panel With LDL/HDL Ratio ordered today. He will continue his medication as directed.  Counseling Intensive lifestyle modifications are the first line treatment for this issue. . Dietary changes: Increase soluble fiber. Decrease simple carbohydrates. . Exercise changes: Moderate to vigorous-intensity aerobic activity 150  minutes per week if tolerated. . Lipid-lowering medications: see documented in medical record.   Vitamin D deficiency. Low Vitamin D level contributes to fatigue and are associated with obesity, breast, and colon cancer. VITAMIN D 25 Hydroxy (Vit-D Deficiency, Fractures) level ordered today.  Depression screening. Belen had a positive depression screening. Depression is commonly associated with obesity and often results in emotional eating behaviors. We will monitor this closely and work on  CBT to help improve the non-hunger eating patterns. Referral to Psychology may be required if no improvement is seen as he continues in our clinic.  Class 3 severe obesity with serious comorbidity and body mass index (BMI) of 45.0 to 49.9 in adult, unspecified obesity type ([place]).  Bria is currently in the action stage of change and his goal is to continue with weight loss efforts. I recommend Daison begin the structured treatment plan as follows:  He has agreed to the Category 4 Plan.  He will work on meal planning and intentional eating.   Exercise goals: Older adults should follow the adult guidelines. When older adults cannot meet the adult guidelines, they should be as physically active as their abilities and conditions will allow.    Behavioral modification strategies: increasing lean protein intake, decreasing simple carbohydrates, increasing vegetables, increasing water intake, decreasing eating out, no skipping meals, meal planning and cooking strategies, keeping healthy foods in the home and planning for success.  He was informed of the importance of frequent follow-up visits to maximize his success with intensive lifestyle modifications for his multiple health conditions. He was informed we would discuss his lab results at his next visit unless there is a critical issue that needs to be addressed sooner. Andreu agreed to keep his next visit at the agreed upon time to discuss these results.  Objective:   Blood pressure (!) 160/80, pulse 70, temperature 98 F (36.7 C), height 5\' 10"  (1.778 m), weight (!) 342 lb (155.1 kg), SpO2 96 %. Body mass index is 49.07 kg/m.  EKG: Sinus  Rhythm with a rate of 77 BPM. First degree A-V block. PRi = 228. Low voltage in precordial leads. Incomplete right bundle branch block. Abnormal.  Indirect Calorimeter completed today shows a VO2 of 326 and a REE of 2267.  His calculated basal metabolic rate is 3818 thus his basal metabolic rate is worse than  expected.  General: Cooperative, alert, well developed, in no acute distress. HEENT: Conjunctivae and lids unremarkable. Cardiovascular: Regular rhythm.  Lungs: Normal work of breathing. Neurologic: No focal deficits.   Lab Results  Component Value Date   CREATININE 0.92 09/15/2019   BUN 19 09/15/2019   NA 141 09/15/2019   K 3.5 09/15/2019   CL 99 09/15/2019   CO2 27 09/15/2019   Lab Results  Component Value Date   ALT 24 09/15/2019   AST 20 09/15/2019   ALKPHOS 98 09/15/2019   BILITOT 0.5 09/15/2019   Lab Results  Component Value Date   HGBA1C 6.2 (H) 09/15/2019   HGBA1C 5.9 (H) 05/22/2015   Lab Results  Component Value Date   INSULIN 23.3 09/15/2019   Lab Results  Component Value Date   TSH 2.570 09/15/2019   Lab Results  Component Value Date   CHOL 126 09/15/2019   HDL 46 09/15/2019   LDLCALC 66 09/15/2019   TRIG 65 09/15/2019   Lab Results  Component Value Date   WBC 7.4 05/22/2015   HGB 11.3 (L) 06/01/2015   HCT 34.8 (L) 06/01/2015  MCV 93.3 05/22/2015   PLT 203 05/22/2015   No results found for: IRON, TIBC, FERRITIN  Obesity Behavioral Intervention Visit Documentation for Insurance:   Approximately 15 minutes were spent on the discussion below.  ASK: We discussed the diagnosis of obesity with Percell Shepard today and Luman agreed to give [place] permission to discuss obesity behavioral modification therapy today.  ASSESS: Allyn has the diagnosis of obesity and his BMI today is 49.2. Dequincy is in the action stage of change.   ADVISE: Latavious was educated on the multiple health risks of obesity as well as the benefit of weight loss to improve his health. He was advised of the need for long term treatment and the importance of lifestyle modifications to improve his current health and to decrease his risk of future health problems.  AGREE: Multiple dietary modification options and treatment options were discussed and Derelle agreed to follow the  recommendations documented in the above note.  ARRANGE: Favor was educated on the importance of frequent visits to treat obesity as outlined per CMS and USPSTF guidelines and agreed to schedule his next follow up appointment today.  Attestation Statements:   Reviewed by clinician on day of visit: allergies, medications, problem list, medical history, surgical history, family history, social history, and previous encounter notes.  Migdalia Dk, am acting as Location manager for [employer], DO   I have reviewed the above documentation for accuracy and completeness, and I agree with the above. Jearld Lesch, DO

## 2019-09-29 ENCOUNTER — Other Ambulatory Visit: Payer: Self-pay

## 2019-09-29 ENCOUNTER — Other Ambulatory Visit (INDEPENDENT_AMBULATORY_CARE_PROVIDER_SITE_OTHER): Payer: Self-pay | Admitting: Bariatrics

## 2019-09-29 ENCOUNTER — Encounter (INDEPENDENT_AMBULATORY_CARE_PROVIDER_SITE_OTHER): Payer: Self-pay | Admitting: Bariatrics

## 2019-09-29 ENCOUNTER — Ambulatory Visit (INDEPENDENT_AMBULATORY_CARE_PROVIDER_SITE_OTHER): Payer: Medicare Other | Admitting: Bariatrics

## 2019-09-29 VITALS — BP 139/67 | HR 77 | Temp 98.2°F | Ht 70.0 in | Wt 337.0 lb

## 2019-09-29 DIAGNOSIS — E119 Type 2 diabetes mellitus without complications: Secondary | ICD-10-CM

## 2019-09-29 DIAGNOSIS — E559 Vitamin D deficiency, unspecified: Secondary | ICD-10-CM | POA: Diagnosis not present

## 2019-09-29 DIAGNOSIS — Z6841 Body Mass Index (BMI) 40.0 and over, adult: Secondary | ICD-10-CM | POA: Diagnosis not present

## 2019-09-29 MED ORDER — VITAMIN D (ERGOCALCIFEROL) 1.25 MG (50000 UNIT) PO CAPS: 50000.0000 [IU] | ORAL_CAPSULE | ORAL | 0 refills | Status: DC

## 2019-09-29 NOTE — Progress Notes (Signed)
Chief Complaint:   OBESITY Alamin Mccuiston is here to discuss his progress with his obesity treatment plan along with follow-up of his obesity related diagnoses. Aurthur is on the Category 4 Plan and states he is following his eating plan approximately 90% of the time. Braeden states he is exercising 0 minutes 0 times per week.  Today's visit was #: 2 Starting weight: 342 lbs Starting date: 09/15/2019 Today's weight: 337 lbs Today's date: 09/29/2019 Total lbs lost to date: 5 Total lbs lost since last in-office visit: 5  Interim History: Justin is down 5 lbs and doing well overall.  Subjective:   Vitamin D deficiency. Navraj is not on Vitamin D supplementation. No nausea, vomiting, or muscle weakness.    Ref. Range 09/15/2019 12:40  Vitamin D, 25-Hydroxy Latest Ref Range: 30.0 - 100.0 ng/mL 26.6 (L)   Type 2 diabetes mellitus without complication, without long-term current use of insulin ([place]). Fasting blood sugars in the 130's and vary after meals. Servando is taking metformin.  Lab Results  Component Value Date   HGBA1C 6.2 (H) 09/15/2019   HGBA1C 5.9 (H) 05/22/2015   Lab Results  Component Value Date   LDLCALC 66 09/15/2019   CREATININE 0.92 09/15/2019   Lab Results  Component Value Date   INSULIN 23.3 09/15/2019   Assessment/Plan:   Vitamin D deficiency. Low Vitamin D level contributes to fatigue and are associated with obesity, breast, and colon cancer. He was given a prescription for Vitamin D, Ergocalciferol, (DRISDOL) 1.25 MG (50000 UNIT) CAPS capsule every week #4 with 0 refills and will follow-up for routine testing of Vitamin D, at least 2-3 times per year to avoid over-replacement.   Type 2 diabetes mellitus without complication, without long-term current use of insulin ([place]). Good blood sugar control is important to decrease the likelihood of diabetic complications such as nephropathy, neuropathy, limb loss, blindness, coronary artery disease, and death.  Intensive lifestyle modification including diet, exercise and weight loss are the first line of treatment for diabetes. Lydia will continue metformin as directed.  Class 3 severe obesity with serious comorbidity and body mass index (BMI) of 45.0 to 49.9 in adult, unspecified obesity type ([place]).  Jerrold is currently in the action stage of change. As such, his goal is to continue with weight loss efforts. He has agreed to the Category 4 Plan.   He will work on meal planning and intentional eating.   We independently reviewed with the patient labs from 09/15/2019 including CMP, lipids, Vitamin D, A1c, insulin, and thyroid panel.  Handout was provided on Protein Equivalents.  Exercise goals: Older adults should follow the adult guidelines. When older adults cannot meet the adult guidelines, they should be as physically active as their abilities and conditions will allow.   Behavioral modification strategies: increasing lean protein intake, decreasing simple carbohydrates, increasing vegetables, increasing water intake, decreasing eating out, no skipping meals, meal planning and cooking strategies, keeping healthy foods in the home and planning for success.  Dekker has agreed to follow-up with our clinic in 2 weeks. He was informed of the importance of frequent follow-up visits to maximize his success with intensive lifestyle modifications for his multiple health conditions.   Objective:   Blood pressure 139/67, pulse 77, temperature 98.2 F (36.8 C), height 5\' 10"  (1.778 m), weight (!) 337 lb (152.9 kg). Body mass index is 48.35 kg/m.  General: Cooperative, alert, well developed, in no acute distress. HEENT: Conjunctivae and lids unremarkable. Cardiovascular: Regular rhythm.  Lungs:  Normal work of breathing. Neurologic: No focal deficits.   Lab Results  Component Value Date   CREATININE 0.92 09/15/2019   BUN 19 09/15/2019   NA 141 09/15/2019   K 3.5 09/15/2019   CL 99 09/15/2019    CO2 27 09/15/2019   Lab Results  Component Value Date   ALT 24 09/15/2019   AST 20 09/15/2019   ALKPHOS 98 09/15/2019   BILITOT 0.5 09/15/2019   Lab Results  Component Value Date   HGBA1C 6.2 (H) 09/15/2019   HGBA1C 5.9 (H) 05/22/2015   Lab Results  Component Value Date   INSULIN 23.3 09/15/2019   Lab Results  Component Value Date   TSH 2.570 09/15/2019   Lab Results  Component Value Date   CHOL 126 09/15/2019   HDL 46 09/15/2019   LDLCALC 66 09/15/2019   TRIG 65 09/15/2019   Lab Results  Component Value Date   WBC 7.4 05/22/2015   HGB 11.3 (L) 06/01/2015   HCT 34.8 (L) 06/01/2015   MCV 93.3 05/22/2015   PLT 203 05/22/2015   No results found for: IRON, TIBC, FERRITIN  Obesity Behavioral Intervention Documentation for Insurance:   Approximately 15 minutes were spent on the discussion below.  ASK: We discussed the diagnosis of obesity with [name] today and [name] agreed to give [place] permission to discuss obesity behavioral modification therapy today.  ASSESS: Lavarius has the diagnosis of obesity and his BMI today is 48.4. Patrice is in the action stage of change.   ADVISE: Ashwath was educated on the multiple health risks of obesity as well as the benefit of weight loss to improve his health. He was advised of the need for long term treatment and the importance of lifestyle modifications to improve his current health and to decrease his risk of future health problems.  AGREE: Multiple dietary modification options and treatment options were discussed and Raydell agreed to follow the recommendations documented in the above note.  ARRANGE: Darral was educated on the importance of frequent visits to treat obesity as outlined per CMS and USPSTF guidelines and agreed to schedule his next follow up appointment today.  Attestation Statements:   Reviewed by clinician on day of visit: allergies, medications, problem list, medical history, surgical history, family history,  social history, and previous encounter notes.  Migdalia Dk, am acting as Location manager for [employer], DO   I have reviewed the above documentation for accuracy and completeness, and I agree with the above. Jearld Lesch, DO

## 2019-09-30 ENCOUNTER — Encounter (INDEPENDENT_AMBULATORY_CARE_PROVIDER_SITE_OTHER): Payer: Self-pay | Admitting: Bariatrics

## 2019-10-10 ENCOUNTER — Other Ambulatory Visit: Payer: Self-pay | Admitting: Interventional Cardiology

## 2019-10-12 ENCOUNTER — Other Ambulatory Visit: Payer: Self-pay

## 2019-10-12 ENCOUNTER — Encounter (INDEPENDENT_AMBULATORY_CARE_PROVIDER_SITE_OTHER): Payer: Self-pay | Admitting: Family Medicine

## 2019-10-12 ENCOUNTER — Ambulatory Visit (INDEPENDENT_AMBULATORY_CARE_PROVIDER_SITE_OTHER): Payer: Medicare Other | Admitting: Family Medicine

## 2019-10-12 VITALS — BP 121/62 | HR 60 | Temp 97.9°F | Ht 70.0 in | Wt 336.0 lb

## 2019-10-12 DIAGNOSIS — Z6841 Body Mass Index (BMI) 40.0 and over, adult: Secondary | ICD-10-CM | POA: Diagnosis not present

## 2019-10-12 DIAGNOSIS — E1169 Type 2 diabetes mellitus with other specified complication: Secondary | ICD-10-CM | POA: Diagnosis not present

## 2019-10-12 DIAGNOSIS — G4733 Obstructive sleep apnea (adult) (pediatric): Secondary | ICD-10-CM | POA: Diagnosis not present

## 2019-10-14 NOTE — Progress Notes (Signed)
Chief Complaint:   OBESITY Benjamin Shepard is here to discuss his progress with his obesity treatment plan along with follow-up of his obesity related diagnoses. Tytus is on the Category 4 Plan and states he is following his eating plan approximately 90-95% of the time. Faheem states he is doing 0 minutes 0 times per week.  Today's visit was #: 3 Starting weight: 342 lbs Starting date: 09/15/2019 Today's weight: 336 lbs Today's date: 10/12/2019 Total lbs lost to date: 6 Total lbs lost since last in-office visit: 1  Interim History: Jeremaine likes all of the food on the plan and his hunger is satisfied. He did go off the plan for one meal on July 4th. Before coming to our clinic he only ate 2 times per day and ate quite a bit of fast food and simple carbohydrates. He is now grilling a lot of his meats and has made several beneficial dietary changes.   Subjective:   1. OSA (obstructive sleep apnea) Tiger is compliant with CPAP, and he reports it helps with his sleep quality.  2. Type 2 diabetes mellitus with other specified complication, without long-term current use of insulin ([place]) Mory's diabetes mellitus is well controlled on metformin. He checks his CBGs a few times per week, and they run in the 120's.  Lab Results  Component Value Date   HGBA1C 6.2 (H) 09/15/2019   HGBA1C 5.9 (H) 05/22/2015   Lab Results  Component Value Date   LDLCALC 66 09/15/2019   CREATININE 0.92 09/15/2019   Lab Results  Component Value Date   INSULIN 23.3 09/15/2019   Assessment/Plan:   1. OSA (obstructive sleep apnea) Intensive lifestyle modifications are the first line treatment for this issue. We discussed several lifestyle modifications today. Ledarius will continue to wear his CPAP, and will continue to work on diet, exercise and weight loss efforts. We will continue to monitor.   2. Type 2 diabetes mellitus with other specified complication, without long-term current use of insulin (HCC) Good  blood sugar control is important to decrease the likelihood of diabetic complications such as nephropathy, neuropathy, limb loss, blindness, coronary artery disease, and death. Intensive lifestyle modification including diet, exercise and weight loss are the first line of treatment for diabetes. Armoni will continue metformin as prescribed.  3. Class 3 severe obesity with serious comorbidity and body mass index (BMI) of 45.0 to 49.9 in adult, unspecified obesity type [facility]) Miran is currently in the action stage of change. As such, his goal is to continue with weight loss efforts. He has agreed to the Category 4 Plan.   Handout given today: Seasonings.  Exercise goals: No exercise has been prescribed at this time.  Behavioral modification strategies: planning for success.  Jahmani has agreed to follow-up with our clinic in 2 weeks with Dr. Owens Shark. He was informed of the importance of frequent follow-up visits to maximize his success with intensive lifestyle modifications for his multiple health conditions.   Objective:   Blood pressure 121/62, pulse 60, temperature 97.9 F (36.6 C), temperature source Oral, height 5\' 10"  (1.778 m), weight (!) 336 lb (152.4 kg), SpO2 97 %. Body mass index is 48.21 kg/m.  General: Cooperative, alert, well developed, in no acute distress. HEENT: Conjunctivae and lids unremarkable. Cardiovascular: Regular rhythm.  Lungs: Normal work of breathing. Neurologic: No focal deficits.   Lab Results  Component Value Date   CREATININE 0.92 09/15/2019   BUN 19 09/15/2019   NA 141 09/15/2019   K 3.5  09/15/2019   CL 99 09/15/2019   CO2 27 09/15/2019   Lab Results  Component Value Date   ALT 24 09/15/2019   AST 20 09/15/2019   ALKPHOS 98 09/15/2019   BILITOT 0.5 09/15/2019   Lab Results  Component Value Date   HGBA1C 6.2 (H) 09/15/2019   HGBA1C 5.9 (H) 05/22/2015   Lab Results  Component Value Date   INSULIN 23.3 09/15/2019   Lab Results  Component  Value Date   TSH 2.570 09/15/2019   Lab Results  Component Value Date   CHOL 126 09/15/2019   HDL 46 09/15/2019   LDLCALC 66 09/15/2019   TRIG 65 09/15/2019   Lab Results  Component Value Date   WBC 7.4 05/22/2015   HGB 11.3 (L) 06/01/2015   HCT 34.8 (L) 06/01/2015   MCV 93.3 05/22/2015   PLT 203 05/22/2015   No results found for: IRON, TIBC, FERRITIN  Obesity Behavioral Intervention Documentation for Insurance:   Approximately 15 minutes were spent on the discussion below.  ASK: We discussed the diagnosis of obesity with [name] today and Laderrick agreed to give [place] permission to discuss obesity behavioral modification therapy today.  ASSESS: Rocklin has the diagnosis of obesity and his BMI today is 48.21. Denim is in the action stage of change.   ADVISE: Oree was educated on the multiple health risks of obesity as well as the benefit of weight loss to improve his health. He was advised of the need for long term treatment and the importance of lifestyle modifications to improve his current health and to decrease his risk of future health problems.  AGREE: Multiple dietary modification options and treatment options were discussed and [employer] agreed to follow the recommendations documented in the above note.  ARRANGE: Gaspard was educated on the importance of frequent visits to treat obesity as outlined per CMS and USPSTF guidelines and agreed to schedule his next follow up appointment today.  Attestation Statements:   Reviewed by clinician on day of visit: allergies, medications, problem list, medical history, surgical history, family history, social history, and previous encounter notes.   Wilhemena Durie, am acting as Location manager for [employer], FNP-C.  I have reviewed the above documentation for accuracy and completeness, and I agree with the above. -  Georgianne Fick, FNP

## 2019-10-15 ENCOUNTER — Encounter (INDEPENDENT_AMBULATORY_CARE_PROVIDER_SITE_OTHER): Payer: Self-pay | Admitting: Family Medicine

## 2019-10-25 ENCOUNTER — Other Ambulatory Visit (INDEPENDENT_AMBULATORY_CARE_PROVIDER_SITE_OTHER): Payer: Self-pay | Admitting: Bariatrics

## 2019-10-25 DIAGNOSIS — E559 Vitamin D deficiency, unspecified: Secondary | ICD-10-CM

## 2019-10-28 ENCOUNTER — Other Ambulatory Visit: Payer: Self-pay

## 2019-10-28 ENCOUNTER — Ambulatory Visit (INDEPENDENT_AMBULATORY_CARE_PROVIDER_SITE_OTHER): Payer: Medicare Other | Admitting: Bariatrics

## 2019-10-28 ENCOUNTER — Other Ambulatory Visit (INDEPENDENT_AMBULATORY_CARE_PROVIDER_SITE_OTHER): Payer: Self-pay | Admitting: Bariatrics

## 2019-10-28 ENCOUNTER — Encounter (INDEPENDENT_AMBULATORY_CARE_PROVIDER_SITE_OTHER): Payer: Self-pay | Admitting: Bariatrics

## 2019-10-28 VITALS — BP 113/66 | HR 65 | Temp 98.4°F | Ht 70.0 in | Wt 336.0 lb

## 2019-10-28 DIAGNOSIS — Z6841 Body Mass Index (BMI) 40.0 and over, adult: Secondary | ICD-10-CM

## 2019-10-28 DIAGNOSIS — E559 Vitamin D deficiency, unspecified: Secondary | ICD-10-CM | POA: Diagnosis not present

## 2019-10-28 DIAGNOSIS — I10 Essential (primary) hypertension: Secondary | ICD-10-CM | POA: Diagnosis not present

## 2019-10-28 MED ORDER — VITAMIN D (ERGOCALCIFEROL) 1.25 MG (50000 UNIT) PO CAPS: 50000.0000 [IU] | ORAL_CAPSULE | ORAL | 0 refills | Status: DC

## 2019-10-28 NOTE — Progress Notes (Signed)
Chief Complaint:   OBESITY Benjamin Shepard is here to discuss his progress with his obesity treatment plan along with follow-up of his obesity related diagnoses. Dainel is on the Category 4 Plan and states he is following his eating plan approximately 90% of the time. Jacaden states he is exercising 0 minutes 0 times per week.  Today's visit was #: 4 Starting weight: 342 lbs Starting date: 09/15/2019 Today's weight: 336 lbs Today's date: 10/28/2019 Total lbs lost to date: 6 Total lbs lost since last in-office visit: 0  Interim History: Dmitriy's weight remains the same. He reports getting in his water. He states he ate out one time.  Subjective:   Essential hypertension. Yaiden is taking Tiazac, Cardura, HCTZ, and Cozaar. Blood pressure is controlled.  BP Readings from Last 3 Encounters:  10/12/19 121/62  09/29/19 139/67  09/15/19 (!) 160/80   Lab Results  Component Value Date   CREATININE 0.92 09/15/2019   CREATININE 1.00 06/01/2015   CREATININE 0.91 05/22/2015   Vitamin D deficiency. No nausea, vomiting, or muscle weakness.    Ref. Range 09/15/2019 12:40  Vitamin D, 25-Hydroxy Latest Ref Range: 30.0 - 100.0 ng/mL 26.6 (L)   Assessment/Plan:   Essential hypertension. Phuc is working on healthy weight loss and exercise to improve blood pressure control. We will watch for signs of hypotension as he continues his lifestyle modifications. He will continue his medications as directed.   Vitamin D deficiency. Low Vitamin D level contributes to fatigue and are associated with obesity, breast, and colon cancer. He was given a prescription for Vitamin D, Ergocalciferol, (DRISDOL) 1.25 MG (50000 UNIT) CAPS capsule every week #4 with 0 refills and will follow-up for routine testing of Vitamin D, at least 2-3 times per year to avoid over-replacement.   Class 3 severe obesity with serious comorbidity and body mass index (BMI) of 45.0 to 49.9 in adult, unspecified obesity type  ([place]).  Kaston is currently in the action stage of change. As such, his goal is to continue with weight loss efforts. He has agreed to the Category 4 Plan.   He will work on meal planning and intentional eating.   Exercise goals: Ronie will start using some resistance for exercise.  Behavioral modification strategies: increasing lean protein intake, decreasing simple carbohydrates, increasing vegetables, increasing water intake, decreasing eating out, no skipping meals, meal planning and cooking strategies and keeping healthy foods in the home.  Kobee has agreed to follow-up with our clinic in 2-3 weeks. He was informed of the importance of frequent follow-up visits to maximize his success with intensive lifestyle modifications for his multiple health conditions.   Objective:   Pulse 65, temperature 98.4 F (36.9 C), height 5\' 10"  (1.778 m), weight (!) 336 lb (152.4 kg), SpO2 96 %. Body mass index is 48.21 kg/m.  General: Cooperative, alert, well developed, in no acute distress. HEENT: Conjunctivae and lids unremarkable. Cardiovascular: Regular rhythm.  Lungs: Normal work of breathing. Neurologic: No focal deficits.   Lab Results  Component Value Date   CREATININE 0.92 09/15/2019   BUN 19 09/15/2019   NA 141 09/15/2019   K 3.5 09/15/2019   CL 99 09/15/2019   CO2 27 09/15/2019   Lab Results  Component Value Date   ALT 24 09/15/2019   AST 20 09/15/2019   ALKPHOS 98 09/15/2019   BILITOT 0.5 09/15/2019   Lab Results  Component Value Date   HGBA1C 6.2 (H) 09/15/2019   HGBA1C 5.9 (H) 05/22/2015  Lab Results  Component Value Date   INSULIN 23.3 09/15/2019   Lab Results  Component Value Date   TSH 2.570 09/15/2019   Lab Results  Component Value Date   CHOL 126 09/15/2019   HDL 46 09/15/2019   LDLCALC 66 09/15/2019   TRIG 65 09/15/2019   Lab Results  Component Value Date   WBC 7.4 05/22/2015   HGB 11.3 (L) 06/01/2015   HCT 34.8 (L) 06/01/2015   MCV 93.3  05/22/2015   PLT 203 05/22/2015   No results found for: IRON, TIBC, FERRITIN  Obesity Behavioral Intervention Documentation for Insurance:   Approximately 15 minutes were spent on the discussion below.  ASK: We discussed the diagnosis of obesity with [name] today and Sofia agreed to give [place] permission to discuss obesity behavioral modification therapy today.  ASSESS: Darreon has the diagnosis of obesity and his BMI today is 48.2. Jama is in the action stage of change.   ADVISE: Cordarious was educated on the multiple health risks of obesity as well as the benefit of weight loss to improve his health. He was advised of the need for long term treatment and the importance of lifestyle modifications to improve his current health and to decrease his risk of future health problems.  AGREE: Multiple dietary modification options and treatment options were discussed and Alva agreed to follow the recommendations documented in the above note.  ARRANGE: Abayomi was educated on the importance of frequent visits to treat obesity as outlined per CMS and USPSTF guidelines and agreed to schedule his next follow up appointment today.  Attestation Statements:   Reviewed by clinician on day of visit: allergies, medications, problem list, medical history, surgical history, family history, social history, and previous encounter notes.  Migdalia Dk, am acting as Location manager for [employer], DO   I have reviewed the above documentation for accuracy and completeness, and I agree with the above. Jearld Lesch, DO

## 2019-11-10 ENCOUNTER — Other Ambulatory Visit: Payer: Self-pay

## 2019-11-10 ENCOUNTER — Ambulatory Visit (INDEPENDENT_AMBULATORY_CARE_PROVIDER_SITE_OTHER): Payer: Medicare Other | Admitting: Podiatry

## 2019-11-10 ENCOUNTER — Encounter: Payer: Self-pay | Admitting: Podiatry

## 2019-11-10 DIAGNOSIS — M79675 Pain in left toe(s): Secondary | ICD-10-CM

## 2019-11-10 DIAGNOSIS — E119 Type 2 diabetes mellitus without complications: Secondary | ICD-10-CM

## 2019-11-10 DIAGNOSIS — M79674 Pain in right toe(s): Secondary | ICD-10-CM

## 2019-11-10 DIAGNOSIS — B351 Tinea unguium: Secondary | ICD-10-CM | POA: Diagnosis not present

## 2019-11-10 NOTE — Progress Notes (Signed)
This patient returns to my office for at risk foot care.  This patient requires this care by a professional since this patient will be at risk due to having  diabetes.This patient is unable to cut nails themselves since the patient cannot reach their nails.These nails are painful walking and wearing shoes.  This patient presents for at risk foot care today.  General Appearance  Alert, conversant and in no acute stress.  Vascular  Dorsalis pedis  are palpable  bilaterally. Posterior tibial pulses are not palpable. Capillary return is within normal limits  bilaterally. Temperature is within normal limits  bilaterally.  Neurologic  Senn-Weinstein monofilament wire test within normal limits  bilaterally. Muscle power within normal limits bilaterally.  Nails Thick disfigured discolored nails with subungual debris  from hallux to fifth toes bilaterally. No evidence of bacterial infection or drainage bilaterally.  Orthopedic  No limitations of motion  feet .  No crepitus or effusions noted.  No bony pathology or digital deformities noted.  Mild  HAV  B/L.  Skin  normotropic skin with no porokeratosis noted bilaterally.  No signs of infections or ulcers noted.     Onychomycosis  Pain in right toe  Pain in left toe.  Consent was obtained for treatment procedures.  Debridement and grinding of long thick nails with clearing of subungual debris.  No infection or ulcer.     Return office visit  3 months        Told patient to return for periodic foot care and evaluation due to potential at risk complications.   Jaquari Reckner DPM  

## 2019-11-11 ENCOUNTER — Other Ambulatory Visit: Payer: Self-pay

## 2019-11-11 ENCOUNTER — Encounter (INDEPENDENT_AMBULATORY_CARE_PROVIDER_SITE_OTHER): Payer: Self-pay | Admitting: Bariatrics

## 2019-11-11 ENCOUNTER — Ambulatory Visit (INDEPENDENT_AMBULATORY_CARE_PROVIDER_SITE_OTHER): Payer: Medicare Other | Admitting: Bariatrics

## 2019-11-11 VITALS — BP 136/68 | HR 62 | Temp 98.4°F | Ht 70.0 in | Wt 335.0 lb

## 2019-11-11 DIAGNOSIS — Z6841 Body Mass Index (BMI) 40.0 and over, adult: Secondary | ICD-10-CM

## 2019-11-11 DIAGNOSIS — I10 Essential (primary) hypertension: Secondary | ICD-10-CM | POA: Diagnosis not present

## 2019-11-11 DIAGNOSIS — G4733 Obstructive sleep apnea (adult) (pediatric): Secondary | ICD-10-CM | POA: Diagnosis not present

## 2019-11-16 ENCOUNTER — Encounter (INDEPENDENT_AMBULATORY_CARE_PROVIDER_SITE_OTHER): Payer: Self-pay | Admitting: Bariatrics

## 2019-11-16 NOTE — Progress Notes (Signed)
Chief Complaint:   OBESITY Benjamin Shepard is here to discuss his progress with his obesity treatment plan along with follow-up of his obesity related diagnoses. Benjamin Shepard is on the Category 4 Plan and states he is following his eating plan approximately 95% of the time. Benjamin Shepard states he is using resistance bands 30 minutes 3-4 times per week.  Today's visit was #: 5 Starting weight: 342 lbs Starting date: 09/15/2019 Today's weight: 335 lbs Today's date: 11/11/2019 Total lbs lost to date: 7 Total lbs lost since last in-office visit: 1  Interim History: Benjamin Shepard is down 1 lb since his last visit. He reports doing okay with his water intake.  Subjective:   OSA (obstructive sleep apnea). Benjamin Shepard is compliant with using CPAP and reports restful sleep.  Essential hypertension. Benjamin Shepard is taking Cardura, Tiazac, HCTZ, and Cozaar. Blood pressure is controlled.  BP Readings from Last 3 Encounters:  11/11/19 136/68  10/28/19 113/66  10/12/19 121/62   Lab Results  Component Value Date   CREATININE 0.92 09/15/2019   CREATININE 1.00 06/01/2015   CREATININE 0.91 05/22/2015   Assessment/Plan:   OSA (obstructive sleep apnea). Intensive lifestyle modifications are the first line treatment for this issue. We discussed several lifestyle modifications today and he will continue to work on diet, exercise and weight loss efforts. We will continue to monitor. Orders and follow up as documented in patient record. Benjamin Shepard will continue CPAP as directed.   Counseling  Sleep apnea is a condition in which breathing pauses or becomes shallow during sleep. This happens over and over during the night. This disrupts your sleep and keeps your body from getting the rest that it needs, which can cause tiredness and lack of energy (fatigue) during the day.  Sleep apnea treatment: If you were given a device to open your airway while you sleep, USE IT!  Sleep hygiene:   Limit or avoid alcohol, caffeinated  beverages, and cigarettes, especially close to bedtime.   Do not eat a large meal or eat spicy foods right before bedtime. This can lead to digestive discomfort that can make it hard for you to sleep.  Keep a sleep diary to help you and your health care provider figure out what could be causing your insomnia.  . Make your bedroom a dark, comfortable place where it is easy to fall asleep. ? Put up shades or blackout curtains to block light from outside. ? Use a white noise machine to block noise. ? Keep the temperature cool. . Limit screen use before bedtime. This includes: ? Watching TV. ? Using your smartphone, tablet, or computer. . Stick to a routine that includes going to bed and waking up at the same times every day and night. This can help you fall asleep faster. Consider making a quiet activity, such as reading, part of your nighttime routine. . Try to avoid taking naps during the day so that you sleep better at night. . Get out of bed if you are still awake after 15 minutes of trying to sleep. Keep the lights down, but try reading or doing a quiet activity. When you feel sleepy, go back to bed.  Essential hypertension. Benjamin Shepard is working on healthy weight loss and exercise to improve blood pressure control. We will watch for signs of hypotension as he continues his lifestyle modifications. He will continue his medications as directed.   Class 3 severe obesity with serious comorbidity and body mass index (BMI) of 45.0 to 49.9 in adult, unspecified  obesity type (Benjamin Shepard).  Benjamin Shepard is currently in the action stage of change. As such, his goal is to continue with weight loss efforts. He has agreed to the Category 4 Plan. He will decrease bread and decrease plan by 100-200 calories.   He will work on meal planning and intentional eating.   Exercise goals: All adults should avoid inactivity. Some physical activity is better than none, and adults who participate in any amount of physical activity  gain some health benefits. Benjamin Shepard will walk as needed.  Behavioral modification strategies: increasing lean protein intake, decreasing simple carbohydrates, increasing vegetables, increasing water intake, decreasing eating out, no skipping meals, meal planning and cooking strategies, keeping healthy foods in the home and planning for success.  Benjamin Shepard has agreed to follow-up with our clinic in 2-3 weeks. He was informed of the importance of frequent follow-up visits to maximize his success with intensive lifestyle modifications for his multiple health conditions.   Objective:   Blood pressure 136/68, pulse 62, temperature 98.4 F (36.9 C), height 5\' 10"  (1.778 m), weight (!) 335 lb (152 kg), SpO2 96 %. Body mass index is 48.07 kg/m.  General: Cooperative, alert, well developed, in no acute distress. HEENT: Conjunctivae and lids unremarkable. Cardiovascular: Regular rhythm.  Lungs: Normal work of breathing. Neurologic: No focal deficits.   Lab Results  Component Value Date   CREATININE 0.92 09/15/2019   BUN 19 09/15/2019   NA 141 09/15/2019   K 3.5 09/15/2019   CL 99 09/15/2019   CO2 27 09/15/2019   Lab Results  Component Value Date   ALT 24 09/15/2019   AST 20 09/15/2019   ALKPHOS 98 09/15/2019   BILITOT 0.5 09/15/2019   Lab Results  Component Value Date   HGBA1C 6.2 (H) 09/15/2019   HGBA1C 5.9 (H) 05/22/2015   Lab Results  Component Value Date   INSULIN 23.3 09/15/2019   Lab Results  Component Value Date   TSH 2.570 09/15/2019   Lab Results  Component Value Date   CHOL 126 09/15/2019   HDL 46 09/15/2019   LDLCALC 66 09/15/2019   TRIG 65 09/15/2019   Lab Results  Component Value Date   WBC 7.4 05/22/2015   HGB 11.3 (L) 06/01/2015   HCT 34.8 (L) 06/01/2015   MCV 93.3 05/22/2015   PLT 203 05/22/2015   No results found for: IRON, TIBC, FERRITIN  Obesity Behavioral Intervention Documentation for Insurance:   Approximately 15 minutes were spent on the  discussion below.  ASK: We discussed the diagnosis of obesity with Benjamin Shepard today and Benjamin Shepard agreed to give [place] permission to discuss obesity behavioral modification therapy today.  ASSESS: Benjamin Shepard has the diagnosis of obesity and his BMI today is 48.1. Benjamin Shepard is in the action stage of change.   ADVISE: Benjamin Shepard was educated on the multiple health risks of obesity as well as the benefit of weight loss to improve his health. He was advised of the need for long term treatment and the importance of lifestyle modifications to improve his current health and to decrease his risk of future health problems.  AGREE: Multiple dietary modification options and treatment options were discussed and Benjamin Shepard agreed to follow the recommendations documented in the above note.  ARRANGE: Benjamin Shepard was educated on the importance of frequent visits to treat obesity as outlined per CMS and USPSTF guidelines and agreed to schedule his next follow up appointment today.  Attestation Statements:   Reviewed by clinician on day of visit: allergies, medications, problem list, medical  history, surgical history, family history, social history, and previous encounter notes.  Benjamin Shepard, am acting as Location manager for [employer], DO   I have reviewed the above documentation for accuracy and completeness, and I agree with the above. Benjamin Lesch, DO

## 2019-11-23 ENCOUNTER — Other Ambulatory Visit (INDEPENDENT_AMBULATORY_CARE_PROVIDER_SITE_OTHER): Payer: Self-pay | Admitting: Bariatrics

## 2019-11-23 DIAGNOSIS — E559 Vitamin D deficiency, unspecified: Secondary | ICD-10-CM

## 2019-11-25 ENCOUNTER — Other Ambulatory Visit: Payer: Self-pay

## 2019-11-25 ENCOUNTER — Ambulatory Visit (INDEPENDENT_AMBULATORY_CARE_PROVIDER_SITE_OTHER): Payer: Medicare Other | Admitting: Bariatrics

## 2019-11-25 VITALS — BP 143/70 | HR 63 | Temp 98.0°F | Ht 70.0 in | Wt 331.0 lb

## 2019-11-25 DIAGNOSIS — E559 Vitamin D deficiency, unspecified: Secondary | ICD-10-CM | POA: Diagnosis not present

## 2019-11-25 DIAGNOSIS — E1169 Type 2 diabetes mellitus with other specified complication: Secondary | ICD-10-CM | POA: Diagnosis not present

## 2019-11-25 DIAGNOSIS — E669 Obesity, unspecified: Secondary | ICD-10-CM | POA: Diagnosis not present

## 2019-11-25 DIAGNOSIS — Z6841 Body Mass Index (BMI) 40.0 and over, adult: Secondary | ICD-10-CM

## 2019-11-26 ENCOUNTER — Other Ambulatory Visit: Payer: Self-pay

## 2019-11-26 ENCOUNTER — Other Ambulatory Visit (INDEPENDENT_AMBULATORY_CARE_PROVIDER_SITE_OTHER): Payer: Self-pay | Admitting: Bariatrics

## 2019-11-26 DIAGNOSIS — E559 Vitamin D deficiency, unspecified: Secondary | ICD-10-CM

## 2019-11-26 MED ORDER — DOXAZOSIN MESYLATE 8 MG PO TABS
16.0000 mg | ORAL_TABLET | Freq: Every day | ORAL | 0 refills | Status: DC
Start: 2019-11-26 — End: 2020-01-10

## 2019-11-29 ENCOUNTER — Telehealth (INDEPENDENT_AMBULATORY_CARE_PROVIDER_SITE_OTHER): Payer: Self-pay | Admitting: Bariatrics

## 2019-11-29 ENCOUNTER — Encounter (INDEPENDENT_AMBULATORY_CARE_PROVIDER_SITE_OTHER): Payer: Self-pay | Admitting: Bariatrics

## 2019-11-29 NOTE — Telephone Encounter (Signed)
Spoke to patient

## 2019-11-29 NOTE — Progress Notes (Signed)
Chief Complaint:   OBESITY Benjamin Shepard is here to discuss his progress with his obesity treatment plan along with follow-up of his obesity related diagnoses. Benjamin Shepard is on the Category 4 Plan and states he is following his eating plan approximately 95% of the time. Benjamin Shepard states he is exercising 0 minutes 0 times per week.  Today's visit was #: 6 Starting weight: 342 lbs Starting date: 09/15/2019 Today's weight: 331 lbs Today's date: 11/25/2019 Total lbs lost to date: 11 Total lbs lost since last in-office visit: 4  Interim History: Benjamin Shepard is down 4 lbs and doing well overall. He is in a good rhythm. He reports doing well with his water intake and is getting adequate protein.  Subjective:   Diabetes mellitus type 2 in obese ([place]). Ayaansh is taking metformin and denies hypoglycemia.  Lab Results  Component Value Date   HGBA1C 6.2 (H) 09/15/2019   HGBA1C 5.9 (H) 05/22/2015   Lab Results  Component Value Date   LDLCALC 66 09/15/2019   CREATININE 0.92 09/15/2019   Lab Results  Component Value Date   INSULIN 23.3 09/15/2019   Vitamin D deficiency. No nausea, vomiting, or muscle weakness.   Assessment/Plan:   Diabetes mellitus type 2 in obese ([place]). Good blood sugar control is important to decrease the likelihood of diabetic complications such as nephropathy, neuropathy, limb loss, blindness, coronary artery disease, and death. Intensive lifestyle modification including diet, exercise and weight loss are the first line of treatment for diabetes. Benjamin Shepard will continue metformin as directed.   Vitamin D deficiency. Low Vitamin D level contributes to fatigue and are associated with obesity, breast, and colon cancer. He agrees to continue to take prescription Vitamin D @ 50,000 IU every week #4 with 0 refills and will follow-up for routine testing of Vitamin D, at least 2-3 times per year to avoid over-replacement.  Class 3 severe obesity with serious comorbidity and body  mass index (BMI) of 45.0 to 49.9 in adult, unspecified obesity type ([place]).  Benjamin Shepard is currently in the action stage of change. As such, his goal is to continue with weight loss efforts. He has agreed to the Category 4 Plan.   He will work on meal planning and intentional eating.   Handout was provided on [employer].  Exercise goals: Older adults should follow the adult guidelines. When older adults cannot meet the adult guidelines, they should be as physically active as their abilities and conditions will allow.   Behavioral modification strategies: increasing lean protein intake, decreasing simple carbohydrates, increasing vegetables, increasing water intake, decreasing eating out, no skipping meals, meal planning and cooking strategies, keeping healthy foods in the home and planning for success.  Benjamin Shepard has agreed to follow-up with our clinic in 2-3 weeks. He was informed of the importance of frequent follow-up visits to maximize his success with intensive lifestyle modifications for his multiple health conditions.   Objective:   Blood pressure (!) 143/70, pulse 63, temperature 98 F (36.7 C), height 5\' 10"  (1.778 m), weight (!) 331 lb (150.1 kg), SpO2 97 %. Body mass index is 47.49 kg/m.  General: Cooperative, alert, well developed, in no acute distress. HEENT: Conjunctivae and lids unremarkable. Cardiovascular: Regular rhythm.  Lungs: Normal work of breathing. Neurologic: No focal deficits.   Lab Results  Component Value Date   CREATININE 0.92 09/15/2019   BUN 19 09/15/2019   NA 141 09/15/2019   K 3.5 09/15/2019   CL 99 09/15/2019   CO2 27 09/15/2019  Lab Results  Component Value Date   ALT 24 09/15/2019   AST 20 09/15/2019   ALKPHOS 98 09/15/2019   BILITOT 0.5 09/15/2019   Lab Results  Component Value Date   HGBA1C 6.2 (H) 09/15/2019   HGBA1C 5.9 (H) 05/22/2015   Lab Results  Component Value Date   INSULIN 23.3 09/15/2019   Lab Results  Component Value  Date   TSH 2.570 09/15/2019   Lab Results  Component Value Date   CHOL 126 09/15/2019   HDL 46 09/15/2019   LDLCALC 66 09/15/2019   TRIG 65 09/15/2019   Lab Results  Component Value Date   WBC 7.4 05/22/2015   HGB 11.3 (L) 06/01/2015   HCT 34.8 (L) 06/01/2015   MCV 93.3 05/22/2015   PLT 203 05/22/2015   No results found for: IRON, TIBC, FERRITIN  Obesity Behavioral Intervention Documentation for Insurance:   Approximately 15 minutes were spent on the discussion below.  ASK: We discussed the diagnosis of obesity with [name] today and Grantley agreed to give [place] permission to discuss obesity behavioral modification therapy today.  ASSESS: Deral has the diagnosis of obesity and his BMI today is 47.6. Jiovany is in the action stage of change.   ADVISE: Emani was educated on the multiple health risks of obesity as well as the benefit of weight loss to improve his health. He was advised of the need for long term treatment and the importance of lifestyle modifications to improve his current health and to decrease his risk of future health problems.  AGREE: Multiple dietary modification options and treatment options were discussed and Starlin agreed to follow the recommendations documented in the above note.  ARRANGE: Izaiha was educated on the importance of frequent visits to treat obesity as outlined per CMS and USPSTF guidelines and agreed to schedule his next follow up appointment today.  Attestation Statements:   Reviewed by clinician on day of visit: allergies, medications, problem list, medical history, surgical history, family history, social history, and previous encounter notes.  Migdalia Dk, am acting as Location manager for [employer], DO   I have reviewed the above documentation for accuracy and completeness, and I agree with the above. Jearld Lesch, DO

## 2019-12-15 DIAGNOSIS — E291 Testicular hypofunction: Secondary | ICD-10-CM | POA: Diagnosis not present

## 2019-12-15 DIAGNOSIS — M109 Gout, unspecified: Secondary | ICD-10-CM | POA: Diagnosis not present

## 2019-12-15 DIAGNOSIS — I10 Essential (primary) hypertension: Secondary | ICD-10-CM | POA: Diagnosis not present

## 2019-12-15 DIAGNOSIS — E7849 Other hyperlipidemia: Secondary | ICD-10-CM | POA: Diagnosis not present

## 2019-12-15 DIAGNOSIS — E1121 Type 2 diabetes mellitus with diabetic nephropathy: Secondary | ICD-10-CM | POA: Diagnosis not present

## 2019-12-15 DIAGNOSIS — Z125 Encounter for screening for malignant neoplasm of prostate: Secondary | ICD-10-CM | POA: Diagnosis not present

## 2019-12-16 ENCOUNTER — Ambulatory Visit (INDEPENDENT_AMBULATORY_CARE_PROVIDER_SITE_OTHER): Payer: Medicare Other | Admitting: Bariatrics

## 2019-12-16 ENCOUNTER — Encounter (INDEPENDENT_AMBULATORY_CARE_PROVIDER_SITE_OTHER): Payer: Self-pay | Admitting: Bariatrics

## 2019-12-16 ENCOUNTER — Other Ambulatory Visit: Payer: Self-pay

## 2019-12-16 VITALS — BP 125/69 | HR 62 | Temp 97.9°F | Ht 70.0 in | Wt 328.0 lb

## 2019-12-16 DIAGNOSIS — E1169 Type 2 diabetes mellitus with other specified complication: Secondary | ICD-10-CM

## 2019-12-16 DIAGNOSIS — E669 Obesity, unspecified: Secondary | ICD-10-CM

## 2019-12-16 DIAGNOSIS — E559 Vitamin D deficiency, unspecified: Secondary | ICD-10-CM | POA: Diagnosis not present

## 2019-12-16 DIAGNOSIS — Z6841 Body Mass Index (BMI) 40.0 and over, adult: Secondary | ICD-10-CM | POA: Diagnosis not present

## 2019-12-16 MED ORDER — VITAMIN D (ERGOCALCIFEROL) 1.25 MG (50000 UNIT) PO CAPS: ORAL_CAPSULE | ORAL | 0 refills | Status: DC

## 2019-12-20 ENCOUNTER — Other Ambulatory Visit (INDEPENDENT_AMBULATORY_CARE_PROVIDER_SITE_OTHER): Payer: Self-pay | Admitting: Bariatrics

## 2019-12-20 DIAGNOSIS — E559 Vitamin D deficiency, unspecified: Secondary | ICD-10-CM

## 2019-12-21 ENCOUNTER — Encounter (INDEPENDENT_AMBULATORY_CARE_PROVIDER_SITE_OTHER): Payer: Self-pay | Admitting: Bariatrics

## 2019-12-21 NOTE — Progress Notes (Signed)
Chief Complaint:   OBESITY Benjamin Shepard is here to discuss his progress with his obesity treatment plan along with follow-up of his obesity related diagnoses. Benjamin Shepard is on the Category 4 Plan and states he is following his eating plan approximately 98% of the time. Benjamin Shepard states he is exercising 0 minutes 0 times per week.  Today's visit was #: 7 Starting weight: 342 lbs Starting date: 09/15/2019 Today's weight: 328 lbs Today's date: 12/16/2019 Total lbs lost to date: 14 Total lbs lost since last in-office visit: 3  Interim History: Benjamin Shepard is down an additional 3 lbs and has done well overall. He reports doing well with his protein intake.  Subjective:   Vitamin D deficiency. Benjamin Shepard is taking Vitamin D supplementation.    Ref. Range 09/15/2019 12:40  Vitamin D, 25-Hydroxy Latest Ref Range: 30.0 - 100.0 ng/mL 26.6 (L)   Diabetes mellitus type 2 in obese ([place]). Benjamin Shepard is taking metformin and denies hypoglycemia.  Lab Results  Component Value Date   HGBA1C 6.2 (H) 09/15/2019   HGBA1C 5.9 (H) 05/22/2015   Lab Results  Component Value Date   LDLCALC 66 09/15/2019   CREATININE 0.92 09/15/2019   Lab Results  Component Value Date   INSULIN 23.3 09/15/2019    Assessment/Plan:   Vitamin D deficiency. Low Vitamin D level contributes to fatigue and are associated with obesity, breast, and colon cancer. He was given a prescription for Vitamin D, Ergocalciferol, (DRISDOL) 1.25 MG (50000 UNIT) CAPS capsule every week #4 with 0 refills and will follow-up for routine testing of Vitamin D, at least 2-3 times per year to avoid over-replacement.   Diabetes mellitus type 2 in obese (Benjamin Shepard). Good blood sugar control is important to decrease the likelihood of diabetic complications such as nephropathy, neuropathy, limb loss, blindness, coronary artery disease, and death. Intensive lifestyle modification including diet, exercise and weight loss are the first line of treatment for  diabetes. Benjamin Shepard will continue his medication as directed.   Class 3 severe obesity with serious comorbidity and body mass index (BMI) of 45.0 to 49.9 in adult, unspecified obesity type (Benjamin Shepard).  Benjamin Shepard is currently in the action stage of change. As such, his goal is to continue with weight loss efforts. He has agreed to the Category 4 Plan.   He will work on meal planning.  Handout was provided on [employer].  Exercise goals: Older adults should follow the adult guidelines. When older adults cannot meet the adult guidelines, they should be as physically active as their abilities and conditions will allow.   Behavioral modification strategies: increasing lean protein intake, decreasing simple carbohydrates, increasing vegetables, increasing water intake, decreasing eating out, no skipping meals, meal planning and cooking strategies, keeping healthy foods in the home and planning for success.  Benjamin Shepard has agreed to follow-up with our clinic fasting in 2 weeks. He was informed of the importance of frequent follow-up visits to maximize his success with intensive lifestyle modifications for his multiple health conditions.   Objective:   Blood pressure 125/69, pulse 62, temperature 97.9 F (36.6 C), height 5\' 10"  (1.778 m), weight (!) 328 lb (148.8 kg), SpO2 96 %. Body mass index is 47.06 kg/m.  General: Cooperative, alert, well developed, in no acute distress. HEENT: Conjunctivae and lids unremarkable. Cardiovascular: Regular rhythm.  Lungs: Normal work of breathing. Neurologic: No focal deficits.   Lab Results  Component Value Date   CREATININE 0.92 09/15/2019   BUN 19 09/15/2019   NA 141 09/15/2019  K 3.5 09/15/2019   CL 99 09/15/2019   CO2 27 09/15/2019   Lab Results  Component Value Date   ALT 24 09/15/2019   AST 20 09/15/2019   ALKPHOS 98 09/15/2019   BILITOT 0.5 09/15/2019   Lab Results  Component Value Date   HGBA1C 6.2 (H) 09/15/2019   HGBA1C 5.9 (H) 05/22/2015   Lab  Results  Component Value Date   INSULIN 23.3 09/15/2019   Lab Results  Component Value Date   TSH 2.570 09/15/2019   Lab Results  Component Value Date   CHOL 126 09/15/2019   HDL 46 09/15/2019   LDLCALC 66 09/15/2019   TRIG 65 09/15/2019   Lab Results  Component Value Date   WBC 7.4 05/22/2015   HGB 11.3 (L) 06/01/2015   HCT 34.8 (L) 06/01/2015   MCV 93.3 05/22/2015   PLT 203 05/22/2015   No results found for: IRON, TIBC, FERRITIN  Obesity Behavioral Intervention:   Approximately 15 minutes were spent on the discussion below.  ASK: We discussed the diagnosis of obesity with Benjamin Shepard today and Benjamin Shepard agreed to give [place] permission to discuss obesity behavioral modification therapy today.  ASSESS: Benjamin Shepard has the diagnosis of obesity and his BMI today is 47.1. Benjamin Shepard is in the action stage of change.   ADVISE: Benjamin Shepard was educated on the multiple health risks of obesity as well as the benefit of weight loss to improve his health. He was advised of the need for long term treatment and the importance of lifestyle modifications to improve his current health and to decrease his risk of future health problems.  AGREE: Multiple dietary modification options and treatment options were discussed and Benjamin Shepard agreed to follow the recommendations documented in the above note.  ARRANGE: Benjamin Shepard was educated on the importance of frequent visits to treat obesity as outlined per CMS and USPSTF guidelines and agreed to schedule his next follow up appointment today.  Attestation Statements:   Reviewed by clinician on day of visit: allergies, medications, problem list, medical history, surgical history, family history, social history, and previous encounter notes.  Migdalia Dk, am acting as Location manager for [employer], DO   I have reviewed the above documentation for accuracy and completeness, and I agree with the above. Jearld Lesch, DO

## 2019-12-22 DIAGNOSIS — R609 Edema, unspecified: Secondary | ICD-10-CM | POA: Diagnosis not present

## 2019-12-22 DIAGNOSIS — G4733 Obstructive sleep apnea (adult) (pediatric): Secondary | ICD-10-CM | POA: Diagnosis not present

## 2019-12-22 DIAGNOSIS — I10 Essential (primary) hypertension: Secondary | ICD-10-CM | POA: Diagnosis not present

## 2019-12-22 DIAGNOSIS — R972 Elevated prostate specific antigen [PSA]: Secondary | ICD-10-CM | POA: Diagnosis not present

## 2019-12-22 DIAGNOSIS — R82998 Other abnormal findings in urine: Secondary | ICD-10-CM | POA: Diagnosis not present

## 2019-12-22 DIAGNOSIS — E291 Testicular hypofunction: Secondary | ICD-10-CM | POA: Diagnosis not present

## 2019-12-22 DIAGNOSIS — M109 Gout, unspecified: Secondary | ICD-10-CM | POA: Diagnosis not present

## 2019-12-22 DIAGNOSIS — E785 Hyperlipidemia, unspecified: Secondary | ICD-10-CM | POA: Diagnosis not present

## 2019-12-22 DIAGNOSIS — Z0000 Encounter for general adult medical examination without abnormal findings: Secondary | ICD-10-CM | POA: Diagnosis not present

## 2019-12-22 DIAGNOSIS — N401 Enlarged prostate with lower urinary tract symptoms: Secondary | ICD-10-CM | POA: Diagnosis not present

## 2019-12-22 DIAGNOSIS — Z23 Encounter for immunization: Secondary | ICD-10-CM | POA: Diagnosis not present

## 2019-12-28 ENCOUNTER — Ambulatory Visit (INDEPENDENT_AMBULATORY_CARE_PROVIDER_SITE_OTHER): Payer: Medicare Other | Admitting: Bariatrics

## 2019-12-28 ENCOUNTER — Encounter (INDEPENDENT_AMBULATORY_CARE_PROVIDER_SITE_OTHER): Payer: Self-pay | Admitting: Bariatrics

## 2019-12-28 ENCOUNTER — Other Ambulatory Visit: Payer: Self-pay

## 2019-12-28 VITALS — BP 131/67 | HR 61 | Temp 98.1°F | Ht 70.0 in | Wt 331.0 lb

## 2019-12-28 DIAGNOSIS — I10 Essential (primary) hypertension: Secondary | ICD-10-CM

## 2019-12-28 DIAGNOSIS — Z6841 Body Mass Index (BMI) 40.0 and over, adult: Secondary | ICD-10-CM | POA: Diagnosis not present

## 2019-12-28 DIAGNOSIS — E785 Hyperlipidemia, unspecified: Secondary | ICD-10-CM | POA: Diagnosis not present

## 2019-12-28 DIAGNOSIS — E1169 Type 2 diabetes mellitus with other specified complication: Secondary | ICD-10-CM

## 2019-12-29 NOTE — Progress Notes (Signed)
Chief Complaint:   OBESITY Flem is here to discuss his progress with his obesity treatment plan along with follow-up of his obesity related diagnoses. Cyler is on the Category 4 Plan and states he is following his eating plan approximately 90% of the time. Bracen states he is exercising for 0 minutes 0 times per week.    Today's visit was #: 8 Starting weight: 342 lbs Starting date: 09/15/2019 Today's weight: 331 lbs Today's date: 12/28/2019 Total lbs lost to date: 11 lbs Total lbs lost since last in-office visit: 0  Interim History: Ladarien's weight is up 3 pounds.  He has eaten out more.  He is getting adequate water.  Subjective:   1. Essential hypertension Review: taking medications as instructed, no medication side effects noted, no chest pain on exertion, no dyspnea on exertion, no swelling of ankles.  He is taking Tiazac, Cardura, HCTZ, and Cozaar.  BP Readings from Last 3 Encounters:  12/28/19 131/67  12/16/19 125/69  11/25/19 (!) 143/70   2. Hyperlipidemia associated with type 2 diabetes mellitus ([place]) Lorena has hyperlipidemia and has been trying to improve his cholesterol levels with intensive lifestyle modification including a low saturated fat diet, exercise and weight loss. He denies any chest pain, claudication or myalgias.  He is taking Lipitor.  Cholesterol 102, LDL 55.  Controlled.  Lab Results  Component Value Date   ALT 24 09/15/2019   AST 20 09/15/2019   ALKPHOS 98 09/15/2019   BILITOT 0.5 09/15/2019   Lab Results  Component Value Date   CHOL 126 09/15/2019   HDL 46 09/15/2019   LDLCALC 66 09/15/2019   TRIG 65 09/15/2019   Assessment/Plan:   1. Essential hypertension Amahd is working on healthy weight loss and exercise to improve blood pressure control. We will watch for signs of hypotension as he continues his lifestyle modifications.  Continue medications.  2. Hyperlipidemia associated with type 2 diabetes mellitus ([place]) Cardiovascular  risk and specific lipid/LDL goals reviewed.  We discussed several lifestyle modifications today and Tycho will continue to work on diet, exercise and weight loss efforts. Orders and follow up as documented in patient record.  Continue Lipitor.   Counseling Intensive lifestyle modifications are the first line treatment for this issue. . Dietary changes: Increase soluble fiber. Decrease simple carbohydrates. . Exercise changes: Moderate to vigorous-intensity aerobic activity 150 minutes per week if tolerated. . Lipid-lowering medications: see documented in medical record.  3. Class 3 severe obesity with serious comorbidity and body mass index (BMI) of 45.0 to 49.9 in adult, unspecified obesity type [facility])  Hairo is currently in the action stage of change. As such, his goal is to continue with weight loss efforts. He has agreed to the Category 4 Plan.   He will work on meal planning, intentional eating, and will adhere more to the plan.  Exercise goals: Be slightly more active.  Behavioral modification strategies: increasing lean protein intake, decreasing simple carbohydrates, increasing vegetables, increasing water intake, decreasing eating out, no skipping meals, meal planning and cooking strategies and keeping healthy foods in the home.  Curran has agreed to follow-up with our clinic in 2-3 weeks. He was informed of the importance of frequent follow-up visits to maximize his success with intensive lifestyle modifications for his multiple health conditions.   Objective:   Blood pressure 131/67, pulse 61, temperature 98.1 F (36.7 C), height 5\' 10"  (1.778 m), weight (!) 331 lb (150.1 kg), SpO2 97 %. Body mass index is 47.49 kg/m.  General: Cooperative, alert, well developed, in no acute distress. HEENT: Conjunctivae and lids unremarkable. Cardiovascular: Regular rhythm.  Lungs: Normal work of breathing. Neurologic: No focal deficits.   Lab Results  Component Value Date   CREATININE  0.92 09/15/2019   BUN 19 09/15/2019   NA 141 09/15/2019   K 3.5 09/15/2019   CL 99 09/15/2019   CO2 27 09/15/2019   Lab Results  Component Value Date   ALT 24 09/15/2019   AST 20 09/15/2019   ALKPHOS 98 09/15/2019   BILITOT 0.5 09/15/2019   Lab Results  Component Value Date   HGBA1C 6.2 (H) 09/15/2019   HGBA1C 5.9 (H) 05/22/2015   Lab Results  Component Value Date   INSULIN 23.3 09/15/2019   Lab Results  Component Value Date   TSH 2.570 09/15/2019   Lab Results  Component Value Date   CHOL 126 09/15/2019   HDL 46 09/15/2019   LDLCALC 66 09/15/2019   TRIG 65 09/15/2019   Lab Results  Component Value Date   WBC 7.4 05/22/2015   HGB 11.3 (L) 06/01/2015   HCT 34.8 (L) 06/01/2015   MCV 93.3 05/22/2015   PLT 203 05/22/2015   Obesity Behavioral Intervention:   Approximately 15 minutes were spent on the discussion below.  ASK: We discussed the diagnosis of obesity with [name] today and Arden agreed to give [place] permission to discuss obesity behavioral modification therapy today.  ASSESS: Gabor has the diagnosis of obesity and his BMI today is 47.5. Butch is in the action stage of change.   ADVISE: Saw was educated on the multiple health risks of obesity as well as the benefit of weight loss to improve his health. He was advised of the need for long term treatment and the importance of lifestyle modifications to improve his current health and to decrease his risk of future health problems.  AGREE: Multiple dietary modification options and treatment options were discussed and Darek agreed to follow the recommendations documented in the above note.  ARRANGE: Alon was educated on the importance of frequent visits to treat obesity as outlined per CMS and USPSTF guidelines and agreed to schedule his next follow up appointment today.  Attestation Statements:   Reviewed by clinician on day of visit: allergies, medications, problem list, medical history, surgical  history, family history, social history, and previous encounter notes.  I, Water quality scientist, CMA, am acting as Location manager for [employer], DO  I have reviewed the above documentation for accuracy and completeness, and I agree with the above. Jearld Lesch, DO

## 2019-12-30 ENCOUNTER — Encounter (INDEPENDENT_AMBULATORY_CARE_PROVIDER_SITE_OTHER): Payer: Self-pay | Admitting: Bariatrics

## 2020-01-02 DIAGNOSIS — Z23 Encounter for immunization: Secondary | ICD-10-CM | POA: Diagnosis not present

## 2020-01-03 ENCOUNTER — Other Ambulatory Visit: Payer: Self-pay | Admitting: Interventional Cardiology

## 2020-01-03 MED ORDER — DILTIAZEM HCL ER BEADS 360 MG PO CP24: 360.0000 mg | ORAL_CAPSULE | Freq: Every day | ORAL | 0 refills | Status: DC

## 2020-01-03 MED ORDER — HYDROCHLOROTHIAZIDE 25 MG PO TABS
25.0000 mg | ORAL_TABLET | Freq: Every day | ORAL | 0 refills | Status: DC
Start: 2020-01-03 — End: 2020-02-22

## 2020-01-10 ENCOUNTER — Other Ambulatory Visit: Payer: Self-pay

## 2020-01-10 DIAGNOSIS — Z1212 Encounter for screening for malignant neoplasm of rectum: Secondary | ICD-10-CM | POA: Diagnosis not present

## 2020-01-10 MED ORDER — DOXAZOSIN MESYLATE 8 MG PO TABS
16.0000 mg | ORAL_TABLET | Freq: Every day | ORAL | 0 refills | Status: DC
Start: 2020-01-10 — End: 2020-01-25

## 2020-01-11 ENCOUNTER — Other Ambulatory Visit (INDEPENDENT_AMBULATORY_CARE_PROVIDER_SITE_OTHER): Payer: Self-pay | Admitting: Bariatrics

## 2020-01-11 ENCOUNTER — Encounter (INDEPENDENT_AMBULATORY_CARE_PROVIDER_SITE_OTHER): Payer: Self-pay | Admitting: Bariatrics

## 2020-01-11 ENCOUNTER — Ambulatory Visit (INDEPENDENT_AMBULATORY_CARE_PROVIDER_SITE_OTHER): Payer: Medicare Other | Admitting: Bariatrics

## 2020-01-11 ENCOUNTER — Other Ambulatory Visit: Payer: Self-pay

## 2020-01-11 VITALS — BP 132/72 | HR 67 | Temp 97.8°F | Ht 70.0 in | Wt 322.0 lb

## 2020-01-11 DIAGNOSIS — E1169 Type 2 diabetes mellitus with other specified complication: Secondary | ICD-10-CM

## 2020-01-11 DIAGNOSIS — E669 Obesity, unspecified: Secondary | ICD-10-CM

## 2020-01-11 DIAGNOSIS — Z6841 Body Mass Index (BMI) 40.0 and over, adult: Secondary | ICD-10-CM

## 2020-01-11 DIAGNOSIS — E559 Vitamin D deficiency, unspecified: Secondary | ICD-10-CM

## 2020-01-11 DIAGNOSIS — E785 Hyperlipidemia, unspecified: Secondary | ICD-10-CM | POA: Diagnosis not present

## 2020-01-11 MED ORDER — VITAMIN D (ERGOCALCIFEROL) 1.25 MG (50000 UNIT) PO CAPS: ORAL_CAPSULE | ORAL | 0 refills | Status: DC

## 2020-01-11 NOTE — Progress Notes (Signed)
Chief Complaint:   OBESITY  Benjamin Shepard is here to discuss his progress with his obesity treatment plan along with follow-up of his obesity related diagnoses. [name] is on the Category 4 Plan and states he is following his eating plan approximately 90% of the time. [name] states he is exercising 0 minutes 0 times per week.  Today's visit was #: 9 Starting weight: 342 lbs Starting date: 09/15/2019 Today's weight: 322 lbs Today's date: 01/11/2020 Total lbs lost to date: 20 Total lbs lost since last in-office visit: 9  Interim History: [name] is down 9 lbs from his last visit. He states he has been more adherent to the plan.  Subjective:   Diabetes mellitus type 2 in obese (Benjamin Shepard). [name] is taking metformin. He reports no lows.   Lab Results  Component Value Date   HGBA1C 6.2 (H) 09/15/2019   HGBA1C 5.9 (H) 05/22/2015   Lab Results  Component Value Date   LDLCALC 66 09/15/2019   CREATININE 0.92 09/15/2019   Lab Results  Component Value Date   INSULIN 23.3 09/15/2019   Hyperlipidemia associated with type 2 diabetes mellitus (Benjamin Shepard). [name] is taking Lipitor.  Lab Results  Component Value Date   CHOL 126 09/15/2019   HDL 46 09/15/2019   LDLCALC 66 09/15/2019   TRIG 65 09/15/2019   Lab Results  Component Value Date   ALT 24 09/15/2019   AST 20 09/15/2019   ALKPHOS 98 09/15/2019   BILITOT 0.5 09/15/2019   The ASCVD Risk score [name], et al., 2013) failed to calculate for the following reasons:   The valid total cholesterol range is 130 to 320 mg/dL  Vitamin D deficiency. [name] is taking Vitamin D supplementation.    Ref. Range 09/15/2019 12:40  Vitamin D, 25-Hydroxy Latest Ref Range: 30.0 - 100.0 ng/mL 26.6 (L)   Assessment/Plan:   Diabetes mellitus type 2 in obese (Benjamin Shepard). Good blood sugar control is important to decrease the likelihood of diabetic complications such as nephropathy, neuropathy, limb loss, blindness, coronary artery disease, and  death. Intensive lifestyle modification including diet, exercise and weight loss are the first line of treatment for diabetes. [name] will continue metformin as directed.   Hyperlipidemia associated with type 2 diabetes mellitus ([place]). Cardiovascular risk and specific lipid/LDL goals reviewed.  We discussed several lifestyle modifications today and Lemario will continue to work on diet, exercise and weight loss efforts. Orders and follow up as documented in patient record. [name] will continue Lipitor as directed.   Counseling Intensive lifestyle modifications are the first line treatment for this issue. . Dietary changes: Increase soluble fiber. Decrease simple carbohydrates. . Exercise changes: Moderate to vigorous-intensity aerobic activity 150 minutes per week if tolerated. . Lipid-lowering medications: see documented in medical record.  Vitamin D deficiency. Low Vitamin D level contributes to fatigue and are associated with obesity, breast, and colon cancer. He was given a prescription for Vitamin D, Ergocalciferol, (DRISDOL) 1.25 MG (50000 UNIT) CAPS capsule every week #4 with 0 refills and will follow-up for routine testing of Vitamin D, at least 2-3 times per year to avoid over-replacement.   Class 3 severe obesity with serious comorbidity and body mass index (BMI) of 45.0 to 49.9 in adult, unspecified obesity type (Benjamin Shepard).  [name] is currently in the action stage of change. As such, his goal is to continue with weight loss efforts. He has agreed to the Category 4 Plan.    He will work on meal planning and adhering more  closely to the plan.   Exercise goals: Older adults should follow the adult guidelines. When older adults cannot meet the adult guidelines, they should be as physically active as their abilities and conditions will allow.   Behavioral modification strategies: increasing lean protein intake, decreasing simple carbohydrates, increasing vegetables, increasing water intake,  decreasing eating out, no skipping meals, meal planning and cooking strategies, keeping healthy foods in the home and planning for success.  [name] has agreed to follow-up with our clinic in 3 weeks. He was informed of the importance of frequent follow-up visits to maximize his success with intensive lifestyle modifications for his multiple health conditions.   Objective:   Blood pressure 132/72, pulse 67, temperature 97.8 F (36.6 C), height 5\' 10"  (1.778 m), weight (!) 322 lb (146.1 kg), SpO2 98 %. Body mass index is 46.2 kg/m.  General: Cooperative, alert, well developed, in no acute distress. HEENT: Conjunctivae and lids unremarkable. Cardiovascular: Regular rhythm.  Lungs: Normal work of breathing. Neurologic: No focal deficits.   Lab Results  Component Value Date   CREATININE 0.92 09/15/2019   BUN 19 09/15/2019   NA 141 09/15/2019   K 3.5 09/15/2019   CL 99 09/15/2019   CO2 27 09/15/2019   Lab Results  Component Value Date   ALT 24 09/15/2019   AST 20 09/15/2019   ALKPHOS 98 09/15/2019   BILITOT 0.5 09/15/2019   Lab Results  Component Value Date   HGBA1C 6.2 (H) 09/15/2019   HGBA1C 5.9 (H) 05/22/2015   Lab Results  Component Value Date   INSULIN 23.3 09/15/2019   Lab Results  Component Value Date   TSH 2.570 09/15/2019   Lab Results  Component Value Date   CHOL 126 09/15/2019   HDL 46 09/15/2019   LDLCALC 66 09/15/2019   TRIG 65 09/15/2019   Lab Results  Component Value Date   WBC 7.4 05/22/2015   HGB 11.3 (L) 06/01/2015   HCT 34.8 (L) 06/01/2015   MCV 93.3 05/22/2015   PLT 203 05/22/2015   No results found for: IRON, TIBC, FERRITIN  Obesity Behavioral Intervention:   Approximately 15 minutes were spent on the discussion below.  ASK: We discussed the diagnosis of obesity with [name] today and [name] agreed to give [place] permission to discuss obesity behavioral modification therapy today.  ASSESS: [name] has the diagnosis of obesity and his BMI  today is 46.2. [name] is in the action stage of change.   ADVISE: [name] was educated on the multiple health risks of obesity as well as the benefit of weight loss to improve his health. He was advised of the need for long term treatment and the importance of lifestyle modifications to improve his current health and to decrease his risk of future health problems.  AGREE: Multiple dietary modification options and treatment options were discussed and Wanda agreed to follow the recommendations documented in the above note.  ARRANGE: Dakwan was educated on the importance of frequent visits to treat obesity as outlined per CMS and USPSTF guidelines and agreed to schedule his next follow up appointment today.  Attestation Statements:   Reviewed by clinician on day of visit: allergies, medications, problem list, medical history, surgical history, family history, social history, and previous encounter notes.  Migdalia Dk, am acting as Location manager for [employer], DO   I have reviewed the above documentation for accuracy and completeness, and I agree with the above. Jearld Lesch, DO

## 2020-01-25 ENCOUNTER — Telehealth: Payer: Self-pay | Admitting: Interventional Cardiology

## 2020-01-25 ENCOUNTER — Other Ambulatory Visit: Payer: Self-pay | Admitting: Interventional Cardiology

## 2020-01-25 DIAGNOSIS — J069 Acute upper respiratory infection, unspecified: Secondary | ICD-10-CM | POA: Diagnosis not present

## 2020-01-25 DIAGNOSIS — G4733 Obstructive sleep apnea (adult) (pediatric): Secondary | ICD-10-CM | POA: Diagnosis not present

## 2020-01-25 DIAGNOSIS — Z1152 Encounter for screening for COVID-19: Secondary | ICD-10-CM | POA: Diagnosis not present

## 2020-01-25 NOTE — Telephone Encounter (Signed)
*  STAT* If patient is at the pharmacy, call can be transferred to refill team.   1. Which medications need to be refilled? (please list name of each medication and dose if known) doxazosin (CARDURA) 8 MG tablet  2. Which pharmacy/location (including street and city if local pharmacy) is medication to be sent to? [facility] [place], [place]  3. Do they need a 30 day or 90 day supply? 90 day supply  Patient scheduled an appointment for 04/28/2020 with Dr. Tamala Julian.

## 2020-01-25 NOTE — Telephone Encounter (Signed)
Called pt to inform him that his medication was sent to his pharmacy as requested. I advised pt that if he has any other problems, questions or concerns, to please give our office a call. Pt verbalized understanding.

## 2020-02-01 ENCOUNTER — Other Ambulatory Visit: Payer: Self-pay

## 2020-02-01 ENCOUNTER — Telehealth (INDEPENDENT_AMBULATORY_CARE_PROVIDER_SITE_OTHER): Payer: Self-pay

## 2020-02-01 ENCOUNTER — Telehealth (INDEPENDENT_AMBULATORY_CARE_PROVIDER_SITE_OTHER): Payer: Medicare Other | Admitting: Adult Health

## 2020-02-01 VITALS — BP 141/71 | HR 67

## 2020-02-01 DIAGNOSIS — E1159 Type 2 diabetes mellitus with other circulatory complications: Secondary | ICD-10-CM | POA: Diagnosis not present

## 2020-02-01 DIAGNOSIS — E1169 Type 2 diabetes mellitus with other specified complication: Secondary | ICD-10-CM | POA: Diagnosis not present

## 2020-02-01 DIAGNOSIS — I152 Hypertension secondary to endocrine disorders: Secondary | ICD-10-CM | POA: Diagnosis not present

## 2020-02-01 DIAGNOSIS — Z6841 Body Mass Index (BMI) 40.0 and over, adult: Secondary | ICD-10-CM

## 2020-02-01 NOTE — Progress Notes (Signed)
TeleHealth Visit:  Due to the COVID-19 pandemic, this visit was completed with telemedicine (audio/video) technology to reduce patient and provider exposure as well as to preserve personal protective equipment.   Benjamin Shepard has verbally consented to this TeleHealth visit. The patient is located at home, the provider is located at the [facility] office. The participants in this visit include the listed provider and patient. The visit was conducted today via video.  Chief Complaint: OBESITY Benjamin Shepard is here to discuss his progress with his obesity treatment plan along with follow-up of his obesity related diagnoses. Benjamin Shepard is on the Category 4 Plan and states he is following his eating plan approximately 50% of the time. Benjamin Shepard states he is exercising 0 minutes 0 times per week.  Today's visit was #: 10 Starting weight: 342 lbs Starting date: 09/15/2019  Interim History: Benjamin Shepard is experiencing nasal drainage and sore throat. He was seen by his PCP, had a negative SARS-CoV-2 test, and completed a 5-day course of Azithromycin. He reports a decrease in cough and nasal secretions have changed from yellow to clear, since completing ABX course. He states "I can stay on Category 4 for the foreseeable future." He continues to enjoy the foods and structure of the Category 4 meal plan. His ultimate goal is to lose down top 200 lbs.  He is unable to weight at home, last weight in clinic was 342.  Subjective:   Type 2 diabetes mellitus with other specified complication, without long-term current use of insulin ([place]). 09/15/2019 A1c was 6.2; ambulatory fasting blood glucose 130-140's. Benjamin Shepard denies episodes of hypoglycemia. He is on metformin 500 mg daily.   Lab Results  Component Value Date   HGBA1C 6.2 (H) 09/15/2019   HGBA1C 5.9 (H) 05/22/2015   Lab Results  Component Value Date   LDLCALC 66 09/15/2019   CREATININE 0.92 09/15/2019   Lab Results  Component Value Date    INSULIN 23.3 09/15/2019   Hypertension associated with type 2 diabetes mellitus ([place]). Blood pressure and heart rate are stable per home reading. [name] denies acute cardiac symptoms. He is on losartan 100 mg daily, HCTZ 25 mg daily, diltiazem 360 mg daily, and doxazosin 8 mg daily.  BP Readings from Last 3 Encounters:  02/01/20 (!) 141/71  01/11/20 132/72  12/28/19 131/67   Lab Results  Component Value Date   CREATININE 0.92 09/15/2019   CREATININE 1.00 06/01/2015   CREATININE 0.91 05/22/2015   Assessment/Plan:   Type 2 diabetes mellitus with other specified complication, without long-term current use of insulin ([place]). Good blood sugar control is important to decrease the likelihood of diabetic complications such as nephropathy, neuropathy, limb loss, blindness, coronary artery disease, and death. Intensive lifestyle modification including diet, exercise and weight loss are the first line of treatment for diabetes. Robley will continue to monitor blood glucose and continue metformin as directed.   Hypertension associated with type 2 diabetes mellitus ([place]). Benjamin Shepard is working on healthy weight loss and exercise to improve blood pressure control. We will watch for signs of hypotension as he continues his lifestyle modifications. He will continue his current antihypertensive regimen as directed and continue to follow the Category 4 meal plan.  Class 3 severe obesity with serious comorbidity and body mass index (BMI) of 45.0 to 49.9 in adult, unspecified obesity type ([place]).  Benjamin Shepard is currently in the action stage of change. As such, his goal is to continue with weight loss efforts. He has agreed to the Category  4 Plan.   Exercise goals: No exercise has been prescribed at this time.  Behavioral modification strategies: increasing lean protein intake, decreasing simple carbohydrates, meal planning and cooking strategies and planning for success.  Benjamin Shepard has agreed to follow-up with our  clinic in 2 weeks (will call for follow-up appointment). He was informed of the importance of frequent follow-up visits to maximize his success with intensive lifestyle modifications for his multiple health conditions.  Objective:   VITALS: Per patient if applicable, see vitals. GENERAL: Alert and in no acute distress. CARDIOPULMONARY: No increased WOB. Speaking in clear sentences.  PSYCH: Pleasant and cooperative. Speech normal rate and rhythm. Affect is appropriate. Insight and judgement are appropriate. Attention is focused, linear, and appropriate.  NEURO: Oriented as arrived to appointment on time with no prompting.   Lab Results  Component Value Date   CREATININE 0.92 09/15/2019   BUN 19 09/15/2019   NA 141 09/15/2019   K 3.5 09/15/2019   CL 99 09/15/2019   CO2 27 09/15/2019   Lab Results  Component Value Date   ALT 24 09/15/2019   AST 20 09/15/2019   ALKPHOS 98 09/15/2019   BILITOT 0.5 09/15/2019   Lab Results  Component Value Date   HGBA1C 6.2 (H) 09/15/2019   HGBA1C 5.9 (H) 05/22/2015   Lab Results  Component Value Date   INSULIN 23.3 09/15/2019   Lab Results  Component Value Date   TSH 2.570 09/15/2019   Lab Results  Component Value Date   CHOL 126 09/15/2019   HDL 46 09/15/2019   LDLCALC 66 09/15/2019   TRIG 65 09/15/2019   Lab Results  Component Value Date   WBC 7.4 05/22/2015   HGB 11.3 (L) 06/01/2015   HCT 34.8 (L) 06/01/2015   MCV 93.3 05/22/2015   PLT 203 05/22/2015   No results found for: IRON, TIBC, FERRITIN  Attestation Statements:   Reviewed by clinician on day of visit: allergies, medications, problem list, medical history, surgical history, family history, social history, and previous encounter notes.  I, Michaelene Song, am acting as Location manager for [employer], NP-C   I have reviewed the above documentation for accuracy and completeness, and I agree with the above. - Katy d. Danford, NP-C

## 2020-02-01 NOTE — Telephone Encounter (Signed)
Pt contacted. Verbal telehealth consent obtained to conduct visit via video call.   Omaira Mellen LPN

## 2020-02-02 ENCOUNTER — Other Ambulatory Visit: Payer: Self-pay

## 2020-02-02 MED ORDER — LOSARTAN POTASSIUM 100 MG PO TABS
100.0000 mg | ORAL_TABLET | Freq: Every day | ORAL | 0 refills | Status: DC
Start: 2020-02-02 — End: 2020-04-04

## 2020-02-02 NOTE — Telephone Encounter (Signed)
Pt's medication was sent to pt's pharmacy as requested. Confirmation received.  °

## 2020-02-09 ENCOUNTER — Encounter: Payer: Self-pay | Admitting: Podiatry

## 2020-02-09 ENCOUNTER — Other Ambulatory Visit: Payer: Self-pay

## 2020-02-09 ENCOUNTER — Ambulatory Visit (INDEPENDENT_AMBULATORY_CARE_PROVIDER_SITE_OTHER): Payer: Medicare Other | Admitting: Podiatry

## 2020-02-09 DIAGNOSIS — M79674 Pain in right toe(s): Secondary | ICD-10-CM

## 2020-02-09 DIAGNOSIS — M79675 Pain in left toe(s): Secondary | ICD-10-CM

## 2020-02-09 DIAGNOSIS — B351 Tinea unguium: Secondary | ICD-10-CM | POA: Diagnosis not present

## 2020-02-09 DIAGNOSIS — E119 Type 2 diabetes mellitus without complications: Secondary | ICD-10-CM | POA: Diagnosis not present

## 2020-02-09 NOTE — Progress Notes (Signed)
This patient returns to my office for at risk foot care.  This patient requires this care by a professional since this patient will be at risk due to having  diabetes.This patient is unable to cut nails themselves since the patient cannot reach their nails.These nails are painful walking and wearing shoes.  This patient presents for at risk foot care today.  General Appearance  Alert, conversant and in no acute stress.  Vascular  Dorsalis pedis  are palpable  bilaterally. Posterior tibial pulses are not palpable. Capillary return is within normal limits  bilaterally. Temperature is within normal limits  bilaterally.  Neurologic  Senn-Weinstein monofilament wire test within normal limits  bilaterally. Muscle power within normal limits bilaterally.  Nails Thick disfigured discolored nails with subungual debris  from hallux to fifth toes bilaterally. No evidence of bacterial infection or drainage bilaterally.  Orthopedic  No limitations of motion  feet .  No crepitus or effusions noted.  No bony pathology or digital deformities noted.  Mild  HAV  B/L.  Skin  normotropic skin with no porokeratosis noted bilaterally.  No signs of infections or ulcers noted.     Onychomycosis  Pain in right toe  Pain in left toe.  Consent was obtained for treatment procedures.  Debridement and grinding of long thick nails with clearing of subungual debris.  No infection or ulcer.     Return office visit  3 months        Told patient to return for periodic foot care and evaluation due to potential at risk complications.   Marrie Chandra DPM  

## 2020-02-14 ENCOUNTER — Other Ambulatory Visit (INDEPENDENT_AMBULATORY_CARE_PROVIDER_SITE_OTHER): Payer: Self-pay | Admitting: Bariatrics

## 2020-02-14 DIAGNOSIS — E559 Vitamin D deficiency, unspecified: Secondary | ICD-10-CM

## 2020-02-15 ENCOUNTER — Other Ambulatory Visit: Payer: Self-pay

## 2020-02-15 ENCOUNTER — Ambulatory Visit (INDEPENDENT_AMBULATORY_CARE_PROVIDER_SITE_OTHER): Payer: Medicare Other | Admitting: Bariatrics

## 2020-02-15 ENCOUNTER — Other Ambulatory Visit (INDEPENDENT_AMBULATORY_CARE_PROVIDER_SITE_OTHER): Payer: Self-pay | Admitting: Bariatrics

## 2020-02-15 ENCOUNTER — Encounter (INDEPENDENT_AMBULATORY_CARE_PROVIDER_SITE_OTHER): Payer: Self-pay | Admitting: Bariatrics

## 2020-02-15 VITALS — BP 120/67 | HR 61 | Temp 98.3°F | Ht 70.0 in | Wt 321.0 lb

## 2020-02-15 DIAGNOSIS — I152 Hypertension secondary to endocrine disorders: Secondary | ICD-10-CM

## 2020-02-15 DIAGNOSIS — E559 Vitamin D deficiency, unspecified: Secondary | ICD-10-CM | POA: Diagnosis not present

## 2020-02-15 DIAGNOSIS — Z6841 Body Mass Index (BMI) 40.0 and over, adult: Secondary | ICD-10-CM | POA: Diagnosis not present

## 2020-02-15 DIAGNOSIS — E1159 Type 2 diabetes mellitus with other circulatory complications: Secondary | ICD-10-CM

## 2020-02-15 MED ORDER — VITAMIN D (ERGOCALCIFEROL) 1.25 MG (50000 UNIT) PO CAPS: ORAL_CAPSULE | ORAL | 0 refills | Status: DC

## 2020-02-15 NOTE — Progress Notes (Signed)
Chief Complaint:   OBESITY Benjamin Shepard is here to discuss his progress with his obesity treatment plan along with follow-up of his obesity related diagnoses. Benjamin Shepard is on the Category 4 Plan and states he is following his eating plan approximately 90% of the time. Benjamin Shepard states he is exercising 0 minutes 0 times per week.  Today's visit was #: 11 Starting weight: 342 lbs Starting date: 09/15/2019 Today's weight: 321 lbs Today's date: 02/15/2020 Total lbs lost to date: 21 Total lbs lost since last in-office visit: 1  Interim History: Benjamin Shepard is down 1 lb and has done well overall.  Subjective:   Vitamin D deficiency. No nausea, vomiting, or muscle weakness. Benjamin Shepard endorses minimal to no sun exposure.   Ref. Range 09/15/2019 12:40  Vitamin D, 25-Hydroxy Latest Ref Range: 30.0 - 100.0 ng/mL 26.6 (L)   Hypertension associated with type 2 diabetes mellitus ([place]). Benjamin Shepard reports taking his medications as directed. Blood pressure is controlled.  BP Readings from Last 3 Encounters:  02/15/20 120/67  02/01/20 (!) 141/71  01/11/20 132/72   Lab Results  Component Value Date   CREATININE 0.92 09/15/2019   CREATININE 1.00 06/01/2015   CREATININE 0.91 05/22/2015   Assessment/Plan:   Vitamin D deficiency. Low Vitamin D level contributes to fatigue and are associated with obesity, breast, and colon cancer. He was given a prescription for Vitamin D, Ergocalciferol, (DRISDOL) 1.25 MG (50000 UNIT) CAPS capsule every week #4 with 0 refills and will follow-up for routine testing of Vitamin D, at least 2-3 times per year to avoid over-replacement.   Hypertension associated with type 2 diabetes mellitus (Benjamin Shepard). Benjamin Shepard is working on healthy weight loss and exercise to improve blood pressure control. We will watch for signs of hypotension as he continues his lifestyle modifications. He will continue his medications as directed.   Class 3 severe obesity with serious comorbidity and body  mass index (BMI) of 45.0 to 49.9 in adult, unspecified obesity type (Benjamin Shepard).  Benjamin Shepard is currently in the action stage of change. As such, his goal is to continue with weight loss efforts. He has agreed to the Category 4 Plan.    He will work on meal planning and intentional eating.   Exercise goals: Benjamin Shepard will be out and about more.  Behavioral modification strategies: increasing lean protein intake, decreasing simple carbohydrates, increasing vegetables, increasing water intake, decreasing eating out, no skipping meals, meal planning and cooking strategies, keeping healthy foods in the home and planning for success.  Benjamin Shepard has agreed to follow-up with our clinic fasting in 4 weeks. He was informed of the importance of frequent follow-up visits to maximize his success with intensive lifestyle modifications for his multiple health conditions.   Objective:   Blood pressure 120/67, pulse 61, temperature 98.3 F (36.8 C), height 5\' 10"  (1.778 m), weight (!) 321 lb (145.6 kg), SpO2 96 %. Body mass index is 46.06 kg/m.  General: Cooperative, alert, well developed, in no acute distress. HEENT: Conjunctivae and lids unremarkable. Cardiovascular: Regular rhythm.  Lungs: Normal work of breathing. Neurologic: No focal deficits.   Lab Results  Component Value Date   CREATININE 0.92 09/15/2019   BUN 19 09/15/2019   NA 141 09/15/2019   K 3.5 09/15/2019   CL 99 09/15/2019   CO2 27 09/15/2019   Lab Results  Component Value Date   ALT 24 09/15/2019   AST 20 09/15/2019   ALKPHOS 98 09/15/2019   BILITOT 0.5 09/15/2019   Lab Results  Component  Value Date   HGBA1C 6.2 (H) 09/15/2019   HGBA1C 5.9 (H) 05/22/2015   Lab Results  Component Value Date   INSULIN 23.3 09/15/2019   Lab Results  Component Value Date   TSH 2.570 09/15/2019   Lab Results  Component Value Date   CHOL 126 09/15/2019   HDL 46 09/15/2019   LDLCALC 66 09/15/2019   TRIG 65 09/15/2019   Lab Results  Component  Value Date   WBC 7.4 05/22/2015   HGB 11.3 (L) 06/01/2015   HCT 34.8 (L) 06/01/2015   MCV 93.3 05/22/2015   PLT 203 05/22/2015   No results found for: IRON, TIBC, FERRITIN  Obesity Behavioral Intervention:   Approximately 15 minutes were spent on the discussion below.  ASK: We discussed the diagnosis of obesity with Benjamin Shepard today and Benjamin Shepard agreed to give [place] permission to discuss obesity behavioral modification therapy today.  ASSESS: Benjamin Shepard has the diagnosis of obesity and his BMI today is 46.1. Benjamin Shepard is in the action stage of change.   ADVISE: Benjamin Shepard was educated on the multiple health risks of obesity as well as the benefit of weight loss to improve his health. He was advised of the need for long term treatment and the importance of lifestyle modifications to improve his current health and to decrease his risk of future health problems.  AGREE: Multiple dietary modification options and treatment options were discussed and Benjamin Shepard agreed to follow the recommendations documented in the above note.  ARRANGE: Benjamin Shepard was educated on the importance of frequent visits to treat obesity as outlined per CMS and USPSTF guidelines and agreed to schedule his next follow up appointment today.  Attestation Statements:   Reviewed by clinician on day of visit: allergies, medications, problem list, medical history, surgical history, family history, social history, and previous encounter notes.  Migdalia Dk, am acting as Location manager for [employer], DO   I have reviewed the above documentation for accuracy and completeness, and I agree with the above. Jearld Lesch, DO

## 2020-02-16 ENCOUNTER — Encounter (INDEPENDENT_AMBULATORY_CARE_PROVIDER_SITE_OTHER): Payer: Self-pay | Admitting: Bariatrics

## 2020-02-22 ENCOUNTER — Other Ambulatory Visit: Payer: Self-pay | Admitting: Interventional Cardiology

## 2020-02-22 NOTE — Telephone Encounter (Signed)
Pt's medication was sent to pt's pharmacy as requested. Confirmation received.  °

## 2020-03-01 DIAGNOSIS — R972 Elevated prostate specific antigen [PSA]: Secondary | ICD-10-CM | POA: Diagnosis not present

## 2020-03-13 ENCOUNTER — Ambulatory Visit (INDEPENDENT_AMBULATORY_CARE_PROVIDER_SITE_OTHER): Payer: Medicare Other | Admitting: Bariatrics

## 2020-03-13 ENCOUNTER — Encounter (INDEPENDENT_AMBULATORY_CARE_PROVIDER_SITE_OTHER): Payer: Self-pay | Admitting: Bariatrics

## 2020-03-13 ENCOUNTER — Other Ambulatory Visit: Payer: Self-pay

## 2020-03-13 VITALS — BP 139/72 | HR 66 | Temp 97.2°F | Ht 70.0 in | Wt 321.0 lb

## 2020-03-13 DIAGNOSIS — E559 Vitamin D deficiency, unspecified: Secondary | ICD-10-CM

## 2020-03-13 DIAGNOSIS — E1169 Type 2 diabetes mellitus with other specified complication: Secondary | ICD-10-CM

## 2020-03-13 DIAGNOSIS — E7849 Other hyperlipidemia: Secondary | ICD-10-CM | POA: Diagnosis not present

## 2020-03-13 DIAGNOSIS — Z6841 Body Mass Index (BMI) 40.0 and over, adult: Secondary | ICD-10-CM

## 2020-03-13 DIAGNOSIS — E785 Hyperlipidemia, unspecified: Secondary | ICD-10-CM | POA: Diagnosis not present

## 2020-03-13 DIAGNOSIS — E669 Obesity, unspecified: Secondary | ICD-10-CM

## 2020-03-13 MED ORDER — VITAMIN D (ERGOCALCIFEROL) 1.25 MG (50000 UNIT) PO CAPS: ORAL_CAPSULE | ORAL | 0 refills | Status: DC

## 2020-03-13 NOTE — Progress Notes (Signed)
Chief Complaint:   OBESITY Benjamin Shepard is here to discuss his progress with his obesity treatment plan along with follow-up of his obesity related diagnoses. [name] is on the Category 4 Plan and states he is following his eating plan approximately 90% of the time. [name] states he is exercising 0 minutes 0 times per week.  Today's visit was #: 12 Starting weight: 342 lbs Starting date: 09/15/2019 Today's weight: 321 lbs Today's date: 03/13/2020 Total lbs lost to date: 21 Total lbs lost since last in-office visit: 0  Interim History: Benjamin Shepard's weight remains the same. He reports doing well with his water and protein intake.  Subjective:   Vitamin D deficiency. No nausea, vomiting, or muscle weakness.    Ref. Range 09/15/2019 12:40  Vitamin D, 25-Hydroxy Latest Ref Range: 30.0 - 100.0 ng/mL 26.6 (L)   Other hyperlipidemia. Benjamin Shepard is taking Lipitor.   Lab Results  Component Value Date   CHOL 126 09/15/2019   HDL 46 09/15/2019   LDLCALC 66 09/15/2019   TRIG 65 09/15/2019   Lab Results  Component Value Date   ALT 24 09/15/2019   AST 20 09/15/2019   ALKPHOS 98 09/15/2019   BILITOT 0.5 09/15/2019   The ASCVD Risk score [name], et al., 2013) failed to calculate for the following reasons:   The valid total cholesterol range is 130 to 320 mg/dL  Diabetes mellitus type 2 in obese ([place]). Benjamin Shepard reports appetite is stable.  Lab Results  Component Value Date   HGBA1C 6.2 (H) 09/15/2019   HGBA1C 5.9 (H) 05/22/2015   Lab Results  Component Value Date   LDLCALC 66 09/15/2019   CREATININE 0.92 09/15/2019   Lab Results  Component Value Date   INSULIN 23.3 09/15/2019   Assessment/Plan:   Vitamin D deficiency. Low Vitamin D level contributes to fatigue and are associated with obesity, breast, and colon cancer. He was given a prescription for Vitamin D, Ergocalciferol, (DRISDOL) 1.25 MG (50000 UNIT) CAPS capsule every week and VITAMIN D 25 Hydroxy (Vit-D  Deficiency, Fractures) level will be checked today.   Other hyperlipidemia. Cardiovascular risk and specific lipid/LDL goals reviewed.  We discussed several lifestyle modifications today and [name] will continue to work on diet, exercise and weight loss efforts. Orders and follow up as documented in patient record. Comprehensive metabolic panel and cholesterol will be checked today.  Counseling Intensive lifestyle modifications are the first line treatment for this issue. . Dietary changes: Increase soluble fiber. Decrease simple carbohydrates. . Exercise changes: Moderate to vigorous-intensity aerobic activity 150 minutes per week if tolerated. . Lipid-lowering medications: see documented in medical record.   Diabetes mellitus type 2 in obese ([place]). Good blood sugar control is important to decrease the likelihood of diabetic complications such as nephropathy, neuropathy, limb loss, blindness, coronary artery disease, and death. Intensive lifestyle modification including diet, exercise and weight loss are the first line of treatment for diabetes. Hemoglobin A1c will be checked today.  Class 3 severe obesity with serious comorbidity and body mass index (BMI) of 45.0 to 49.9 in adult, unspecified obesity type ([place]).  Kvion is currently in the action stage of change. As such, his goal is to continue with weight loss efforts. He has agreed to the Category 4 Plan.   He will work on meal planning and mindful eating.   Exercise goals: Benjamin Shepard will stay active at home.  Behavioral modification strategies: increasing lean protein intake, decreasing simple carbohydrates, increasing vegetables, increasing water intake, decreasing eating  out, no skipping meals, meal planning and cooking strategies, keeping healthy foods in the home and planning for success.  Benjamin Shepard has agreed to follow-up with our clinic in 3 weeks. He was informed of the importance of frequent follow-up visits to maximize his success with  intensive lifestyle modifications for his multiple health conditions.   Benjamin Shepard was informed we would discuss his lab results at his next visit unless there is a critical issue that needs to be addressed sooner. Benjamin Shepard agreed to keep his next visit at the agreed upon time to discuss these results.  Objective:   Blood pressure 139/72, pulse 66, temperature (!) 97.2 F (36.2 C), height 5\' 10"  (1.778 m), weight (!) 321 lb (145.6 kg), SpO2 98 %. Body mass index is 46.06 kg/m.  General: Cooperative, alert, well developed, in no acute distress. HEENT: Conjunctivae and lids unremarkable. Cardiovascular: Regular rhythm.  Lungs: Normal work of breathing. Neurologic: No focal deficits.   Lab Results  Component Value Date   CREATININE 0.92 09/15/2019   BUN 19 09/15/2019   NA 141 09/15/2019   K 3.5 09/15/2019   CL 99 09/15/2019   CO2 27 09/15/2019   Lab Results  Component Value Date   ALT 24 09/15/2019   AST 20 09/15/2019   ALKPHOS 98 09/15/2019   BILITOT 0.5 09/15/2019   Lab Results  Component Value Date   HGBA1C 6.2 (H) 09/15/2019   HGBA1C 5.9 (H) 05/22/2015   Lab Results  Component Value Date   INSULIN 23.3 09/15/2019   Lab Results  Component Value Date   TSH 2.570 09/15/2019   Lab Results  Component Value Date   CHOL 126 09/15/2019   HDL 46 09/15/2019   LDLCALC 66 09/15/2019   TRIG 65 09/15/2019   Lab Results  Component Value Date   WBC 7.4 05/22/2015   HGB 11.3 (L) 06/01/2015   HCT 34.8 (L) 06/01/2015   MCV 93.3 05/22/2015   PLT 203 05/22/2015   No results found for: IRON, TIBC, FERRITIN  Obesity Behavioral Intervention:   Approximately 15 minutes were spent on the discussion below.  ASK: We discussed the diagnosis of obesity with Benjamin Shepard today and Benjamin Shepard agreed to give [place] permission to discuss obesity behavioral modification therapy today.  ASSESS: Benjamin Shepard has the diagnosis of obesity and his BMI today is 46.1. Benjamin Shepard is in the action stage of change.    ADVISE: Benjamin Shepard was educated on the multiple health risks of obesity as well as the benefit of weight loss to improve his health. He was advised of the need for long term treatment and the importance of lifestyle modifications to improve his current health and to decrease his risk of future health problems.  AGREE: Multiple dietary modification options and treatment options were discussed and Reynolds agreed to follow the recommendations documented in the above note.  ARRANGE: Hani was educated on the importance of frequent visits to treat obesity as outlined per CMS and USPSTF guidelines and agreed to schedule his next follow up appointment today.  Attestation Statements:   Reviewed by clinician on day of visit: allergies, medications, problem list, medical history, surgical history, family history, social history, and previous encounter notes.  Migdalia Dk, am acting as Location manager for [employer], DO   I have reviewed the above documentation for accuracy and completeness, and I agree with the above. Jearld Lesch, DO

## 2020-03-14 LAB — COMPREHENSIVE METABOLIC PANEL
ALT: 24 IU/L (ref 0–44)
AST: 16 IU/L (ref 0–40)
Albumin/Globulin Ratio: 1.9 (ref 1.2–2.2)
Albumin: 4.5 g/dL (ref 3.7–4.7)
Alkaline Phosphatase: 86 IU/L (ref 44–121)
BUN/Creatinine Ratio: 19 (ref 10–24)
BUN: 17 mg/dL (ref 8–27)
Bilirubin Total: 0.6 mg/dL (ref 0.0–1.2)
CO2: 26 mmol/L (ref 20–29)
Calcium: 9.6 mg/dL (ref 8.6–10.2)
Chloride: 104 mmol/L (ref 96–106)
Creatinine, Ser: 0.88 mg/dL (ref 0.76–1.27)
GFR calc Af Amer: 96 mL/min/{1.73_m2} (ref >59)
GFR calc non Af Amer: 83 mL/min/{1.73_m2} (ref >59)
Globulin, Total: 2.4 g/dL (ref 1.5–4.5)
Glucose: 126 mg/dL — ABNORMAL HIGH (ref 65–99)
Potassium: 4.5 mmol/L (ref 3.5–5.2)
Sodium: 144 mmol/L (ref 134–144)
Total Protein: 6.9 g/dL (ref 6.0–8.5)

## 2020-03-14 LAB — LIPID PANEL WITH LDL/HDL RATIO
Cholesterol, Total: 107 mg/dL (ref 100–199)
HDL: 41 mg/dL (ref >39)
LDL Chol Calc (NIH): 52 mg/dL (ref 0–99)
LDL/HDL Ratio: 1.3 ratio (ref 0.0–3.6)
Triglycerides: 66 mg/dL (ref 0–149)
VLDL Cholesterol Cal: 14 mg/dL (ref 5–40)

## 2020-03-14 LAB — HEMOGLOBIN A1C
Est. average glucose Bld gHb Est-mCnc: 114 mg/dL
Hgb A1c MFr Bld: 5.6 % (ref 4.8–5.6)

## 2020-03-14 LAB — VITAMIN D 25 HYDROXY (VIT D DEFICIENCY, FRACTURES): Vit D, 25-Hydroxy: 55.6 ng/mL (ref 30.0–100.0)

## 2020-04-02 ENCOUNTER — Other Ambulatory Visit: Payer: Self-pay | Admitting: Interventional Cardiology

## 2020-04-03 ENCOUNTER — Encounter (INDEPENDENT_AMBULATORY_CARE_PROVIDER_SITE_OTHER): Payer: Self-pay | Admitting: Bariatrics

## 2020-04-03 ENCOUNTER — Ambulatory Visit (INDEPENDENT_AMBULATORY_CARE_PROVIDER_SITE_OTHER): Payer: Medicare Other | Admitting: Bariatrics

## 2020-04-03 ENCOUNTER — Other Ambulatory Visit: Payer: Self-pay

## 2020-04-03 VITALS — BP 145/73 | HR 74 | Temp 97.7°F | Ht 70.0 in | Wt 317.0 lb

## 2020-04-03 DIAGNOSIS — Z6841 Body Mass Index (BMI) 40.0 and over, adult: Secondary | ICD-10-CM | POA: Diagnosis not present

## 2020-04-03 DIAGNOSIS — E559 Vitamin D deficiency, unspecified: Secondary | ICD-10-CM | POA: Diagnosis not present

## 2020-04-03 DIAGNOSIS — E1169 Type 2 diabetes mellitus with other specified complication: Secondary | ICD-10-CM

## 2020-04-03 DIAGNOSIS — E669 Obesity, unspecified: Secondary | ICD-10-CM | POA: Diagnosis not present

## 2020-04-03 DIAGNOSIS — I10 Essential (primary) hypertension: Secondary | ICD-10-CM

## 2020-04-03 MED ORDER — VITAMIN D (ERGOCALCIFEROL) 1.25 MG (50000 UNIT) PO CAPS: ORAL_CAPSULE | ORAL | 0 refills | Status: DC

## 2020-04-04 ENCOUNTER — Other Ambulatory Visit: Payer: Self-pay

## 2020-04-04 ENCOUNTER — Encounter (INDEPENDENT_AMBULATORY_CARE_PROVIDER_SITE_OTHER): Payer: Self-pay | Admitting: Bariatrics

## 2020-04-04 MED ORDER — LOSARTAN POTASSIUM 100 MG PO TABS: 100.0000 mg | ORAL_TABLET | Freq: Every day | ORAL | 0 refills | Status: DC

## 2020-04-04 NOTE — Progress Notes (Signed)
Chief Complaint:   OBESITY Benjamin Shepard is here to discuss his progress with his obesity treatment plan along with follow-up of his obesity related diagnoses. Benjamin Shepard is on the Category 4 Plan and states he is following his eating plan approximately 80% of the time. Benjamin Shepard states he is not exercising regularly at this time.  Today's visit was #: 13 Starting weight: 342 lbs Starting date: 09/15/2019 Today's weight: 317 lbs Today's date: 04/03/2020 Total lbs lost to date: 25 lbs Total lbs lost since last in-office visit: 4 lbs  Interim History: Benjamin Shepard is down an additional 4 pounds since his last visit and has done well overall.  Subjective:   1. Diabetes mellitus type 2 in obese [facility]) Benjamin Shepard is taking Glucophage 500 mg daily.  A1c is 5.6.  Lab Results  Component Value Date   HGBA1C 5.6 03/13/2020   HGBA1C 6.2 (H) 09/15/2019   HGBA1C 5.9 (H) 05/22/2015   Lab Results  Component Value Date   LDLCALC 52 03/13/2020   CREATININE 0.88 03/13/2020   Lab Results  Component Value Date   INSULIN 23.3 09/15/2019   2. Essential hypertension Controlled.  Review: taking medications as instructed, no medication side effects noted, no chest pain on exertion, no dyspnea on exertion, no swelling of ankles.   BP Readings from Last 3 Encounters:  04/03/20 (!) 145/73  03/13/20 139/72  02/15/20 120/67   3. Vitamin D deficiency Benjamin Shepard's Vitamin D level was 55.6 on 03/13/2020. He is currently taking prescription vitamin D 50,000 IU each week. He denies nausea, vomiting or muscle weakness.  Assessment/Plan:   1. Diabetes mellitus type 2 in obese (HCC) Good blood sugar control is important to decrease the likelihood of diabetic complications such as nephropathy, neuropathy, limb loss, blindness, coronary artery disease, and death. Intensive lifestyle modification including diet, exercise and weight loss are the first line of treatment for diabetes.  He will continue his medication.  2. Essential  hypertension Benjamin Shepard is working on healthy weight loss and exercise to improve blood pressure control. We will watch for signs of hypotension as he continues his lifestyle modifications.  Continue medication.  3. Vitamin D deficiency Low Vitamin D level contributes to fatigue and are associated with obesity, breast, and colon cancer. He agrees to continue to take prescription Vitamin D @50 ,000 IU every week and will follow-up for routine testing of Vitamin D, at least 2-3 times per year to avoid over-replacement.  -Refill Vitamin D, Ergocalciferol, (DRISDOL) 1.25 MG (50000 UNIT) CAPS capsule; TAKE 1 CAPSULE BY MOUTH EVERY 7 DAYS  Dispense: 4 capsule; Refill: 0  4. 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 As such, his goal is to continue with weight loss efforts. He has agreed to the Category 4 Plan.   He will work on meal planning and intentional eating.  Labs from 03/13/2020, including CMP, lipid panel, vitamin D, and A1c, were reviewed with him today.  Exercise goals: Increase exercise.  Behavioral modification strategies: increasing lean protein intake, decreasing simple carbohydrates, increasing vegetables, increasing water intake, decreasing eating out, no skipping meals, meal planning and cooking strategies, keeping healthy foods in the home and planning for success.  Benjamin Shepard has agreed to follow-up with our clinic in 2-3 weeks. He was informed of the importance of frequent follow-up visits to maximize his success with intensive lifestyle modifications for his multiple  health conditions.   Objective:   Blood pressure (!) 145/73, pulse 74, temperature 97.7 F (36.5 C), height 5\' 10"  (1.778 m), weight (!) 317 lb (143.8 kg), SpO2 97 %. Body mass index is 45.48 kg/m.  General: Cooperative, alert, well developed, in no acute distress. HEENT:  Conjunctivae and lids unremarkable. Cardiovascular: Regular rhythm.  Lungs: Normal work of breathing. Neurologic: No focal deficits.   Lab Results  Component Value Date   CREATININE 0.88 03/13/2020   BUN 17 03/13/2020   NA 144 03/13/2020   K 4.5 03/13/2020   CL 104 03/13/2020   CO2 26 03/13/2020   Lab Results  Component Value Date   ALT 24 03/13/2020   AST 16 03/13/2020   ALKPHOS 86 03/13/2020   BILITOT 0.6 03/13/2020   Lab Results  Component Value Date   HGBA1C 5.6 03/13/2020   HGBA1C 6.2 (H) 09/15/2019   HGBA1C 5.9 (H) 05/22/2015   Lab Results  Component Value Date   INSULIN 23.3 09/15/2019   Lab Results  Component Value Date   TSH 2.570 09/15/2019   Lab Results  Component Value Date   CHOL 107 03/13/2020   HDL 41 03/13/2020   LDLCALC 52 03/13/2020   TRIG 66 03/13/2020   Lab Results  Component Value Date   WBC 7.4 05/22/2015   HGB 11.3 (L) 06/01/2015   HCT 34.8 (L) 06/01/2015   MCV 93.3 05/22/2015   PLT 203 05/22/2015   Obesity Behavioral Intervention:   Approximately 15 minutes were spent on the discussion below.  ASK: We discussed the diagnosis of obesity with Benjamin Shepard today and Benjamin Shepard agreed to give [place] permission to discuss obesity behavioral modification therapy today.  ASSESS: Benjamin Shepard has the diagnosis of obesity and his BMI today is 45.6. Benjamin Shepard is in the action stage of change.   ADVISE: Benjamin Shepard was educated on the multiple health risks of obesity as well as the benefit of weight loss to improve his health. He was advised of the need for long term treatment and the importance of lifestyle modifications to improve his current health and to decrease his risk of future health problems.  AGREE: Multiple dietary modification options and treatment options were discussed and Benjamin Shepard agreed to follow the recommendations documented in the above note.  ARRANGE: Benjamin Shepard was educated on the importance of frequent visits to treat obesity as outlined per CMS  and USPSTF guidelines and agreed to schedule his next follow up appointment today.  Attestation Statements:   Reviewed by clinician on day of visit: allergies, medications, problem list, medical history, surgical history, family history, social history, and previous encounter notes.  I, Water quality scientist, CMA, am acting as Location manager for [employer], DO  I have reviewed the above documentation for accuracy and completeness, and I agree with the above. Jearld Lesch, DO

## 2020-04-20 ENCOUNTER — Other Ambulatory Visit: Payer: Self-pay | Admitting: Interventional Cardiology

## 2020-04-20 ENCOUNTER — Other Ambulatory Visit: Payer: Self-pay

## 2020-04-20 MED ORDER — DOXAZOSIN MESYLATE 8 MG PO TABS: 16.0000 mg | ORAL_TABLET | Freq: Every day | ORAL | 0 refills | Status: DC

## 2020-04-20 NOTE — Telephone Encounter (Signed)
Pt's medication was sent to pt's pharmacy as requested. Confirmation received.  °

## 2020-04-24 ENCOUNTER — Ambulatory Visit (INDEPENDENT_AMBULATORY_CARE_PROVIDER_SITE_OTHER): Payer: Medicare Other | Admitting: Bariatrics

## 2020-04-24 ENCOUNTER — Other Ambulatory Visit: Payer: Self-pay

## 2020-04-24 ENCOUNTER — Encounter (INDEPENDENT_AMBULATORY_CARE_PROVIDER_SITE_OTHER): Payer: Self-pay | Admitting: Bariatrics

## 2020-04-24 VITALS — BP 125/65 | HR 63 | Temp 98.3°F | Ht 70.0 in | Wt 318.0 lb

## 2020-04-24 DIAGNOSIS — Z6841 Body Mass Index (BMI) 40.0 and over, adult: Secondary | ICD-10-CM

## 2020-04-24 DIAGNOSIS — I10 Essential (primary) hypertension: Secondary | ICD-10-CM

## 2020-04-24 DIAGNOSIS — E1169 Type 2 diabetes mellitus with other specified complication: Secondary | ICD-10-CM | POA: Diagnosis not present

## 2020-04-25 ENCOUNTER — Encounter (INDEPENDENT_AMBULATORY_CARE_PROVIDER_SITE_OTHER): Payer: Self-pay | Admitting: Bariatrics

## 2020-04-25 NOTE — Progress Notes (Signed)
Chief Complaint:   OBESITY Benjamin Shepard is here to discuss his progress with his obesity treatment plan along with follow-up of his obesity related diagnoses. Benjamin Shepard is on the Category 4 Plan and states he is following his eating plan approximately 90% of the time. Benjamin Shepard states he is not exercising at this time.  Today's visit was #: 14 Starting weight: 342 lbs Starting date: 09/15/2019 Today's weight: 318 lbs Today's date: 04/24/2020 Total lbs lost to date: 24 lbs Total lbs lost since last in-office visit: 0  Interim History: Benjamin Shepard is up 1 pound since his last visit.  Subjective:   1. Essential hypertension Review: taking medications as instructed, no medication side effects noted, no chest pain on exertion, no dyspnea on exertion, no swelling of ankles.  He is taking his medications as directed.  BP Readings from Last 3 Encounters:  04/24/20 125/65  04/03/20 (!) 145/73  03/13/20 139/72   2. Type 2 diabetes mellitus with other specified complication, without long-term current use of insulin ([facility]CC) Benjamin Shepard is taking metformin 500 mg daily.  Lab Results  Component Value Date   HGBA1C 5.6 03/13/2020   HGBA1C 6.2 (H) 09/15/2019   HGBA1C 5.9 (H) 05/22/2015   Lab Results  Component Value Date   LDLCALC 52 03/13/2020   CREATININE 0.88 03/13/2020   Lab Results  Component Value Date   INSULIN 23.3 09/15/2019   Assessment/Plan:   1. Essential hypertension Benjamin Shepard is working on healthy weight loss and exercise to improve blood pressure control. We will watch for signs of hypotension as he continues his lifestyle modifications.  Continue medications.  2. Type 2 diabetes mellitus with other specified complication, without long-term current use of insulin (HCC) Good blood sugar control is important to decrease the likelihood of diabetic complications such as nephropathy, neuropathy, limb loss, blindness, coronary artery disease, and death. Intensive lifestyle modification including  diet, exercise and weight loss are the first line of treatment for diabetes.  Continue medications.   3. Class 3 severe obesity with serious comorbidity and body mass index (BMI) of 45.0 to 49.9 in adult, unspecified obesity type [facility])  Benjamin Shepard is currently in the action stage of change. As such, his goal is to continue with weight loss efforts. He has agreed to the Category 4 Plan.   He will work on meal planning, mindful eating, and will keep up with his water intake.    Handout given:  Recipes II.  Exercise goals: Benjamin Shepard should follow the adult guidelines. When Benjamin Shepard cannot meet the adult guidelines, they should be as physically active as their abilities and conditions will allow.  Benjamin Shepard should do exercises that maintain or improve balance if they are at risk of falling.   Behavioral modification strategies: increasing lean protein intake, decreasing simple carbohydrates, increasing vegetables, increasing water intake, decreasing eating out, no skipping meals, meal planning and cooking strategies, keeping healthy foods in the home and planning for success.  Benjamin Shepard has agreed to follow-up with our clinic in 2-3 weeks. He was informed of the importance of frequent follow-up visits to maximize his success with intensive lifestyle modifications for his multiple health conditions.   Objective:   Blood pressure 125/65, pulse 63, temperature 98.3 F (36.8 C), height 5\' 10"  (1.778 m), weight (!) 318 lb (144.2 kg), SpO2 96 %. Body mass index is 45.63 kg/m.  General: Cooperative, alert, well developed, in no acute distress. HEENT: Conjunctivae and lids unremarkable. Cardiovascular: Regular rhythm.  Lungs: Normal work of  breathing. Neurologic: No focal deficits.   Lab Results  Component Value Date   CREATININE 0.88 03/13/2020   BUN 17 03/13/2020   NA 144 03/13/2020   K 4.5 03/13/2020   CL 104 03/13/2020   CO2 26 03/13/2020   Lab Results  Component Value Date   ALT  24 03/13/2020   AST 16 03/13/2020   ALKPHOS 86 03/13/2020   BILITOT 0.6 03/13/2020   Lab Results  Component Value Date   HGBA1C 5.6 03/13/2020   HGBA1C 6.2 (H) 09/15/2019   HGBA1C 5.9 (H) 05/22/2015   Lab Results  Component Value Date   INSULIN 23.3 09/15/2019   Lab Results  Component Value Date   TSH 2.570 09/15/2019   Lab Results  Component Value Date   CHOL 107 03/13/2020   HDL 41 03/13/2020   LDLCALC 52 03/13/2020   TRIG 66 03/13/2020   Lab Results  Component Value Date   WBC 7.4 05/22/2015   HGB 11.3 (L) 06/01/2015   HCT 34.8 (L) 06/01/2015   MCV 93.3 05/22/2015   PLT 203 05/22/2015   Obesity Behavioral Intervention:   Approximately 15 minutes were spent on the discussion below.  ASK: We discussed the diagnosis of obesity with Benjamin Shepard today and Benjamin Shepard agreed to give [place] permission to discuss obesity behavioral modification therapy today.  ASSESS: Benjamin Shepard has the diagnosis of obesity and his BMI today is 45.7. Benjamin Shepard is in the action stage of change.   ADVISE: Benjamin Shepard was educated on the multiple health risks of obesity as well as the benefit of weight loss to improve his health. He was advised of the need for long term treatment and the importance of lifestyle modifications to improve his current health and to decrease his risk of future health problems.  AGREE: Multiple dietary modification options and treatment options were discussed and Benjamin Shepard agreed to follow the recommendations documented in the above note.  ARRANGE: Benjamin Shepard was educated on the importance of frequent visits to treat obesity as outlined per CMS and USPSTF guidelines and agreed to schedule his next follow up appointment today.   Attestation Statements:   Reviewed by clinician on day of visit: allergies, medications, problem list, medical history, surgical history, family history, social history, and previous encounter notes.  Time spent on visit including pre-visit chart review and  post-visit care and charting was 20 minutes.   I, Water quality scientist, CMA, am acting as Location manager for [employer], DO  I have reviewed the above documentation for accuracy and completeness, and I agree with the above. Jearld Lesch, DO

## 2020-04-27 NOTE — Progress Notes (Signed)
Cardiology Office Note:    Date:  04/28/2020   ID:  Benjamin Shepard, DOB December 31, 1945, MRN [id number]  PCP:  Velna Hatchet, MD  Cardiologist:  [name] Grooms, MD   Referring MD: Velna Hatchet, MD   No chief complaint on file.   History of Present Illness:    Benjamin Shepard is a 75 y.o. male with a hx of  dyspnea on exertion, type 2 diabetes mellitus, hypertension, hyperlipidemia, and obstructive sleep apnea.  He is being followed for primary prevention purposes given his high risk profile.  He denies chest pain, orthopnea, PND, edema, syncope, and claudication.  Mrs. C.in this past 6 months.  She had cancer, bilateral lower extremity amputations, and multiple other medical problems.  They have been married over 57 years.  He has enrolled in the common, healthy weight and wellness program.  An EKG was done in June when he enrolled.  He has lost 25 pounds.  Past Medical History:  Diagnosis Date  . Anxiety   . Back pain   . BPH (benign prostatic hyperplasia)   . Diabetes ([place])   . Edema   . Edema of both lower extremities   . Foley catheter in place   . Food allergy   . Gout   . HTN (hypertension)   . Hyperlipidemia   . Joint pain   . OSA (obstructive sleep apnea)    USES C-PAP  . Psoriasis   . Shortness of breath dyspnea    WITH EXERTION  . Urinary retention     Past Surgical History:  Procedure Laterality Date  . BACK SURGERY     AGE 19  . CARDIAC CATHETERIZATION N/A 04/14/2015   Procedure: Left Heart Cath and Coronary Angiography;  Surgeon: Belva Crome, MD;  Location: [facility];  Service: Cardiovascular;  Laterality: N/A;  . ROBOT ASSISTED INGUINAL HERNIA REPAIR Bilateral 05/31/2015   Procedure: ROBOT ASSISTED INGUINAL HERNIA REPAIR;  Surgeon: Alexis Frock, MD;  Location: [facility];  Service: Urology;  Laterality: Bilateral;  . XI ROBOTIC ASSISTED SIMPLE PROSTATECTOMY N/A 05/31/2015   Procedure: XI ROBOTIC ASSISTED SIMPLE PROSTATECTOMY;  Surgeon:  Alexis Frock, MD;  Location: [facility];  Service: Urology;  Laterality: N/A;    Current Medications: Current Meds  Medication Sig  . allopurinol (ZYLOPRIM) 100 MG tablet Take 100 mg by mouth daily.  . ASPIRIN 81 PO Take 81 mg by mouth daily.  Marland Kitchen atorvastatin (LIPITOR) 20 MG tablet Take 20 mg by mouth daily.  . colchicine 0.6 MG tablet Take 0.6 mg by mouth daily as needed (gout).   Marland Kitchen diltiazem (TIAZAC) 360 MG 24 hr capsule Take 1 capsule (360 mg total) by mouth daily. Please keep upcoming appt in January 2022 with Dr. Tamala Julian before anymore refills. Thank you  . doxazosin (CARDURA) 8 MG tablet Take 2 tablets (16 mg total) by mouth daily. Please keep upcoming appt in January with Dr. Tamala Julian for future refills. Thank you  . escitalopram (LEXAPRO) 10 MG tablet Take 10 mg by mouth daily.  . furosemide (LASIX) 20 MG tablet Take 20 mg by mouth daily as needed (leg swelling).  . hydrochlorothiazide (HYDRODIURIL) 25 MG tablet Take 1 tablet (25 mg total) by mouth daily. Please keep upcoming appt in January 2022 with Dr. Tamala Julian before anymore refills. Thank you  . losartan (COZAAR) 100 MG tablet Take 1 tablet (100 mg total) by mouth daily. Please keep upcoming appt in January 2022 with Dr. Tamala Julian before anymore refills. Thank you  .  metFORMIN (GLUCOPHAGE) 500 MG tablet Take 500 mg by mouth daily with breakfast.   . oxyCODONE-acetaminophen (PERCOCET/ROXICET) 5-325 MG tablet Take 1-2 tablets by mouth every 4 to 6 hours as needed for pain.  . Vitamin D, Ergocalciferol, (DRISDOL) 1.25 MG (50000 UNIT) CAPS capsule TAKE 1 CAPSULE BY MOUTH EVERY 7 DAYS     Allergies:   Shrimp [shellfish allergy] and Other   Social History   Socioeconomic History  . Marital status: Married    Spouse name: Not on file  . Number of children: Not on file  . Years of education: Not on file  . Highest education level: Not on file  Occupational History  . Not on file  Tobacco Use  . Smoking status: Former Smoker    Types:  Cigarettes    Quit date: 03/15/1998    Years since quitting: 22.1  . Smokeless tobacco: Never Used  Substance and Sexual Activity  . Alcohol use: Yes    Comment: rare wine, beer  . Drug use: No  . Sexual activity: Not on file  Other Topics Concern  . Not on file  Social History Narrative  . Not on file   Social Determinants of Health   Financial Resource Strain: Not on file  Food Insecurity: Not on file  Transportation Needs: Not on file  Physical Activity: Not on file  Stress: Not on file  Social Connections: Not on file     Family History: The patient's family history includes Anxiety disorder in his mother; Depression in his mother; Heart Problems in his mother; Heart disease in his brother, brother, and mother; Heart failure in his mother; High blood pressure in his mother; Hypertension in his father. There is no history of Colon cancer.  ROS:   Please see the history of present illness.    Depression reactive secondary to death of his wife.  We are doing a reasonably good job at controlling risk.  LDL and hemoglobin A1c have been below target.  All other systems reviewed and are negative.  EKGs/Labs/Other Studies Reviewed:    The following studies were reviewed today: No new or recent cardiac imaging  EKG:  EKG performed on September 15, 2019, demonstrates right bundle, first-degree AV block, and with right bundle branch block and wide QRS duration  when compared to the prior tracing Done May 01, 2017  Recent Labs: 09/15/2019: TSH 2.570 03/13/2020: ALT 24; BUN 17; Creatinine, Ser 0.88; Potassium 4.5; Sodium 144  Recent Lipid Panel    Component Value Date/Time   CHOL 107 03/13/2020 1002   TRIG 66 03/13/2020 1002   HDL 41 03/13/2020 1002   LDLCALC 52 03/13/2020 1002    Physical Exam:    VS:  BP 124/70 (BP Location: Left Arm, Patient Position: Sitting, Cuff Size: Normal)   Pulse 84   Ht 5\' 10"  (1.778 m)   Wt (!) 325 lb (147.4 kg)   SpO2 95%   BMI 46.63 kg/m      Wt Readings from Last 3 Encounters:  04/28/20 (!) 325 lb (147.4 kg)  04/24/20 (!) 318 lb (144.2 kg)  04/03/20 (!) 317 lb (143.8 kg)     GEN: Morbidly obese. No acute distress HEENT: Normal NECK: No JVD. LYMPHATICS: No lymphadenopathy CARDIAC: No murmur. RRR no gallop, and there is left ankle greater than right ankle edema. VASCULAR:  Normal Pulses. No bruits. RESPIRATORY:  Clear to auscultation without rales, wheezing or rhonchi  ABDOMEN: Soft, non-tender, non-distended, No pulsatile mass, MUSCULOSKELETAL: No deformity  SKIN: Warm and dry NEUROLOGIC:  Alert and oriented x 3 PSYCHIATRIC:  Normal affect   ASSESSMENT:    1. Dyspnea on exertion   2. OSA (obstructive sleep apnea)   3. Essential hypertension   4. Type 2 diabetes mellitus with other circulatory complication, with long-term current use of insulin ([place])   5. Educated about COVID-19 virus infection    PLAN:    In order of problems listed above:  1. Stable.  No chest discomfort.  Improved since weight loss. 2. Compliant with CPAP. 3. Excellent blood pressure on current therapy of HydroDIURIL, Cardura, Tia Zach, and Cozaar.  Continue same. 4. Consider SGLT2 therapy for cardioprotection if sugar becomes poorly controlled. 5. Vaccinated and practicing mitigation.  Plan clinical follow-up in 1 year   Medication Adjustments/Labs and Tests Ordered: Current medicines are reviewed at length with the patient today.  Concerns regarding medicines are outlined above.  No orders of the defined types were placed in this encounter.  No orders of the defined types were placed in this encounter.   Patient Instructions  Medication Instructions:  Your physician recommends that you continue on your current medications as directed. Please refer to the Current Medication list given to you today.  *If you need a refill on your cardiac medications before your next appointment, please call your pharmacy*   Lab Work: None If  you have labs (blood work) drawn today and your tests are completely normal, you will receive your results only by: Marland Kitchen MyChart Message (if you have MyChart) OR . A paper copy in the mail If you have any lab test that is abnormal or we need to change your treatment, we will call you to review the results.   Testing/Procedures: None   Follow-Up: At [facility], you and your health needs are our priority.  As part of our continuing mission to provide you with exceptional heart care, we have created designated Provider Care Teams.  These Care Teams include your primary Cardiologist (physician) and Advanced Practice Providers (APPs -  Physician Assistants and Nurse Practitioners) who all work together to provide you with the care you need, when you need it.  We recommend signing up for the patient portal called "MyChart".  Sign up information is provided on this After Visit Summary.  MyChart is used to connect with patients for Virtual Visits (Telemedicine).  Patients are able to view lab/test results, encounter notes, upcoming appointments, etc.  Non-urgent messages can be sent to your provider as well.   To learn more about what you can do with MyChart, go to [email or web address].    Your next appointment:   1 year(s)  The format for your next appointment:   In Person  Provider:   You may see [name] Grooms, MD or one of the following Advanced Practice Providers on your designated Care Team:    Kathyrn Drown, NP    Other Instructions      Signed, [name] Grooms, MD  04/28/2020 10:07 AM    [place]

## 2020-04-28 ENCOUNTER — Other Ambulatory Visit: Payer: Self-pay

## 2020-04-28 ENCOUNTER — Encounter: Payer: Self-pay | Admitting: Interventional Cardiology

## 2020-04-28 ENCOUNTER — Ambulatory Visit (INDEPENDENT_AMBULATORY_CARE_PROVIDER_SITE_OTHER): Payer: Medicare Other | Admitting: Interventional Cardiology

## 2020-04-28 VITALS — BP 124/70 | HR 84 | Ht 70.0 in | Wt 325.0 lb

## 2020-04-28 DIAGNOSIS — E1159 Type 2 diabetes mellitus with other circulatory complications: Secondary | ICD-10-CM | POA: Diagnosis not present

## 2020-04-28 DIAGNOSIS — Z7189 Other specified counseling: Secondary | ICD-10-CM | POA: Diagnosis not present

## 2020-04-28 DIAGNOSIS — R0609 Other forms of dyspnea: Secondary | ICD-10-CM

## 2020-04-28 DIAGNOSIS — G4733 Obstructive sleep apnea (adult) (pediatric): Secondary | ICD-10-CM

## 2020-04-28 DIAGNOSIS — Z794 Long term (current) use of insulin: Secondary | ICD-10-CM | POA: Diagnosis not present

## 2020-04-28 DIAGNOSIS — I10 Essential (primary) hypertension: Secondary | ICD-10-CM

## 2020-04-28 DIAGNOSIS — R0600 Dyspnea, unspecified: Secondary | ICD-10-CM | POA: Diagnosis not present

## 2020-04-28 NOTE — Patient Instructions (Signed)

## 2020-05-08 ENCOUNTER — Ambulatory Visit (INDEPENDENT_AMBULATORY_CARE_PROVIDER_SITE_OTHER): Payer: Medicare Other | Admitting: Bariatrics

## 2020-05-08 ENCOUNTER — Encounter (INDEPENDENT_AMBULATORY_CARE_PROVIDER_SITE_OTHER): Payer: Self-pay | Admitting: Bariatrics

## 2020-05-08 ENCOUNTER — Other Ambulatory Visit: Payer: Self-pay

## 2020-05-08 VITALS — BP 119/66 | HR 62 | Temp 98.3°F | Ht 70.0 in | Wt 317.0 lb

## 2020-05-08 DIAGNOSIS — Z6841 Body Mass Index (BMI) 40.0 and over, adult: Secondary | ICD-10-CM

## 2020-05-08 DIAGNOSIS — E7849 Other hyperlipidemia: Secondary | ICD-10-CM

## 2020-05-08 DIAGNOSIS — I10 Essential (primary) hypertension: Secondary | ICD-10-CM | POA: Diagnosis not present

## 2020-05-09 NOTE — Progress Notes (Signed)
Chief Complaint:   OBESITY Kenden is here to discuss his progress with his obesity treatment plan along with follow-up of his obesity related diagnoses. Deyvi is on the Category 4 Plan and states he is following his eating plan approximately 80% of the time. Ramiro states he is doing 0 minutes 0 times per week.  Today's visit was #: 15 Starting weight: 342 lbs Starting date: 09/15/2019 Today's weight: 317 lbs Today's date: 05/08/2020 Total lbs lost to date: 25 Total lbs lost since last in-office visit: 1  Interim History: Deniel is down 1 additional pounds since his last visit, and he has done well overall. His cravings are not as intense.  Subjective:   1. Other hyperlipidemia Mauricio is taking Lipitor. He denies any chest pain, claudication or myalgias.  2. Essential hypertension Jajuan is taking his medications as directed.  Assessment/Plan:   1. Other hyperlipidemia Cardiovascular risk and specific lipid/LDL goals reviewed. We discussed several lifestyle modifications today. Ho will continue to work on diet, exercise and weight loss efforts. Orders and follow up as documented in patient record.   Counseling Intensive lifestyle modifications are the first line treatment for this issue. . Dietary changes: Increase soluble fiber. Decrease simple carbohydrates. . Exercise changes: Moderate to vigorous-intensity aerobic activity 150 minutes per week if tolerated. . Lipid-lowering medications: see documented in medical record.  2. Essential hypertension Mahin is working on healthy weight loss and exercise to improve blood pressure control. We will watch for signs of hypotension as he continues his lifestyle modifications.  3. Class 3 severe obesity with serious comorbidity and body mass index (BMI) of 45.0 to 49.9 in adult, unspecified obesity type [facility]) [place]ton is currently in the action stage of change. As such, his goal is to continue with weight loss efforts. He has  agreed to the Category 4 Plan.   Leeam will adhere closely to the plan.   Exercise goals: Increase activities.  Behavioral modification strategies: increasing lean protein intake, decreasing simple carbohydrates, increasing vegetables, increasing water intake, decreasing eating out, no skipping meals, meal planning and cooking strategies, keeping healthy foods in the home and planning for success.  Ezana has agreed to follow-up with our clinic in 2 weeks. He was informed of the importance of frequent follow-up visits to maximize his success with intensive lifestyle modifications for his multiple health conditions.   Objective:   Blood pressure 119/66, pulse 62, temperature 98.3 F (36.8 C), height 5\' 10"  (1.778 m), weight (!) 317 lb (143.8 kg), SpO2 96 %. Body mass index is 45.48 kg/m.  General: Cooperative, alert, well developed, in no acute distress. HEENT: Conjunctivae and lids unremarkable. Cardiovascular: Regular rhythm.  Lungs: Normal work of breathing. Neurologic: No focal deficits.   Lab Results  Component Value Date   CREATININE 0.88 03/13/2020   BUN 17 03/13/2020   NA 144 03/13/2020   K 4.5 03/13/2020   CL 104 03/13/2020   CO2 26 03/13/2020   Lab Results  Component Value Date   ALT 24 03/13/2020   AST 16 03/13/2020   ALKPHOS 86 03/13/2020   BILITOT 0.6 03/13/2020   Lab Results  Component Value Date   HGBA1C 5.6 03/13/2020   HGBA1C 6.2 (H) 09/15/2019   HGBA1C 5.9 (H) 05/22/2015   Lab Results  Component Value Date   INSULIN 23.3 09/15/2019   Lab Results  Component Value Date   TSH 2.570 09/15/2019   Lab Results  Component Value Date   CHOL 107 03/13/2020  HDL 41 03/13/2020   LDLCALC 52 03/13/2020   TRIG 66 03/13/2020   Lab Results  Component Value Date   WBC 7.4 05/22/2015   HGB 11.3 (L) 06/01/2015   HCT 34.8 (L) 06/01/2015   MCV 93.3 05/22/2015   PLT 203 05/22/2015   No results found for: IRON, TIBC, FERRITIN  Obesity Behavioral  Intervention:   Approximately 15 minutes were spent on the discussion below.  ASK: We discussed the diagnosis of obesity with [name] today and Ellis agreed to give [place] permission to discuss obesity behavioral modification therapy today.  ASSESS: Raoul has the diagnosis of obesity and his BMI today is 45.48. [place] is in the action stage of change.   ADVISE: Kip was educated on the multiple health risks of obesity as well as the benefit of weight loss to improve his health. He was advised of the need for long term treatment and the importance of lifestyle modifications to improve his current health and to decrease his risk of future health problems.  AGREE: Multiple dietary modification options and treatment options were discussed and Yusuke agreed to follow the recommendations documented in the above note.  ARRANGE: Destan was educated on the importance of frequent visits to treat obesity as outlined per CMS and USPSTF guidelines and agreed to schedule his next follow up appointment today.  Attestation Statements:   Reviewed by clinician on day of visit: allergies, medications, problem list, medical history, surgical history, family history, social history, and previous encounter notes.   Wilhemena Durie, am acting as Location manager for [employer], DO.  I have reviewed the above documentation for accuracy and completeness, and I agree with the above. Jearld Lesch, DO

## 2020-05-11 ENCOUNTER — Encounter (INDEPENDENT_AMBULATORY_CARE_PROVIDER_SITE_OTHER): Payer: Self-pay | Admitting: Bariatrics

## 2020-05-16 ENCOUNTER — Ambulatory Visit (INDEPENDENT_AMBULATORY_CARE_PROVIDER_SITE_OTHER): Payer: Medicare Other | Admitting: Podiatry

## 2020-05-16 ENCOUNTER — Encounter: Payer: Self-pay | Admitting: Podiatry

## 2020-05-16 ENCOUNTER — Other Ambulatory Visit: Payer: Self-pay

## 2020-05-16 DIAGNOSIS — E119 Type 2 diabetes mellitus without complications: Secondary | ICD-10-CM | POA: Diagnosis not present

## 2020-05-16 DIAGNOSIS — B351 Tinea unguium: Secondary | ICD-10-CM | POA: Diagnosis not present

## 2020-05-16 DIAGNOSIS — M79674 Pain in right toe(s): Secondary | ICD-10-CM | POA: Diagnosis not present

## 2020-05-16 DIAGNOSIS — M79675 Pain in left toe(s): Secondary | ICD-10-CM | POA: Diagnosis not present

## 2020-05-16 NOTE — Progress Notes (Signed)
This patient returns to my office for at risk foot care.  This patient requires this care by a professional since this patient will be at risk due to having  diabetes.This patient is unable to cut nails themselves since the patient cannot reach their nails.These nails are painful walking and wearing shoes.  This patient presents for at risk foot care today.  General Appearance  Alert, conversant and in no acute stress.  Vascular  Dorsalis pedis  are palpable  bilaterally. Posterior tibial pulses are not palpable. Capillary return is within normal limits  bilaterally. Temperature is within normal limits  bilaterally.  Neurologic  Senn-Weinstein monofilament wire test within normal limits  bilaterally. Muscle power within normal limits bilaterally.  Nails Thick disfigured discolored nails with subungual debris  from hallux to fifth toes bilaterally. No evidence of bacterial infection or drainage bilaterally.  Orthopedic  No limitations of motion  feet .  No crepitus or effusions noted.  No bony pathology or digital deformities noted.  Mild  HAV  B/L.  Skin  normotropic skin with no porokeratosis noted bilaterally.  No signs of infections or ulcers noted.     Onychomycosis  Pain in right toe  Pain in left toe.  Consent was obtained for treatment procedures.  Debridement and grinding of long thick nails with clearing of subungual debris.  No infection or ulcer.     Return office visit  3 months        Told patient to return for periodic foot care and evaluation due to potential at risk complications.   Hassan Blackshire DPM  

## 2020-05-20 ENCOUNTER — Other Ambulatory Visit: Payer: Self-pay | Admitting: Interventional Cardiology

## 2020-05-22 ENCOUNTER — Other Ambulatory Visit: Payer: Self-pay

## 2020-05-22 DIAGNOSIS — H5202 Hypermetropia, left eye: Secondary | ICD-10-CM | POA: Diagnosis not present

## 2020-05-22 DIAGNOSIS — H35373 Puckering of macula, bilateral: Secondary | ICD-10-CM | POA: Diagnosis not present

## 2020-05-22 DIAGNOSIS — H26491 Other secondary cataract, right eye: Secondary | ICD-10-CM | POA: Diagnosis not present

## 2020-05-22 DIAGNOSIS — Z9842 Cataract extraction status, left eye: Secondary | ICD-10-CM | POA: Diagnosis not present

## 2020-05-22 DIAGNOSIS — H35033 Hypertensive retinopathy, bilateral: Secondary | ICD-10-CM | POA: Diagnosis not present

## 2020-05-22 DIAGNOSIS — H524 Presbyopia: Secondary | ICD-10-CM | POA: Diagnosis not present

## 2020-05-22 DIAGNOSIS — E119 Type 2 diabetes mellitus without complications: Secondary | ICD-10-CM | POA: Diagnosis not present

## 2020-05-22 DIAGNOSIS — H52223 Regular astigmatism, bilateral: Secondary | ICD-10-CM | POA: Diagnosis not present

## 2020-05-22 DIAGNOSIS — H5211 Myopia, right eye: Secondary | ICD-10-CM | POA: Diagnosis not present

## 2020-05-22 DIAGNOSIS — I10 Essential (primary) hypertension: Secondary | ICD-10-CM | POA: Diagnosis not present

## 2020-05-22 DIAGNOSIS — Z961 Presence of intraocular lens: Secondary | ICD-10-CM | POA: Diagnosis not present

## 2020-05-22 MED ORDER — DOXAZOSIN MESYLATE 8 MG PO TABS: 16.0000 mg | ORAL_TABLET | Freq: Every day | ORAL | 3 refills | Status: DC

## 2020-05-23 ENCOUNTER — Other Ambulatory Visit: Payer: Self-pay | Admitting: Interventional Cardiology

## 2020-05-29 ENCOUNTER — Other Ambulatory Visit: Payer: Self-pay

## 2020-05-29 ENCOUNTER — Ambulatory Visit (INDEPENDENT_AMBULATORY_CARE_PROVIDER_SITE_OTHER): Payer: Medicare Other | Admitting: Bariatrics

## 2020-05-29 ENCOUNTER — Encounter (INDEPENDENT_AMBULATORY_CARE_PROVIDER_SITE_OTHER): Payer: Self-pay | Admitting: Bariatrics

## 2020-05-29 VITALS — BP 140/66 | HR 63 | Temp 98.1°F | Ht 70.0 in | Wt 318.0 lb

## 2020-05-29 DIAGNOSIS — Z6841 Body Mass Index (BMI) 40.0 and over, adult: Secondary | ICD-10-CM | POA: Diagnosis not present

## 2020-05-29 DIAGNOSIS — E559 Vitamin D deficiency, unspecified: Secondary | ICD-10-CM

## 2020-05-29 DIAGNOSIS — E669 Obesity, unspecified: Secondary | ICD-10-CM

## 2020-05-29 DIAGNOSIS — E1169 Type 2 diabetes mellitus with other specified complication: Secondary | ICD-10-CM

## 2020-05-29 MED ORDER — VITAMIN D (ERGOCALCIFEROL) 1.25 MG (50000 UNIT) PO CAPS
ORAL_CAPSULE | ORAL | 0 refills | Status: DC
Start: 2020-05-29 — End: 2020-07-17

## 2020-05-30 NOTE — Progress Notes (Signed)
Chief Complaint:   OBESITY Benjamin Shepard is here to discuss his progress with his obesity treatment plan along with follow-up of his obesity related diagnoses. Benjamin Shepard is on the Category 4 Plan and states he is following his eating plan approximately 80% of the time. Benjamin Shepard states he is not exercising regularly.  Today's visit was #: 60 Starting weight: 342 lbs Starting date: 09/15/2019 Today's weight: 318 lbs Today's date: 05/29/2020 Total lbs lost to date: 24 lbs Total lbs lost since last in-office visit: 0  Interim History: Benjamin Shepard's weight is up 1 pound, but he has done well overall.  He is doing well with his water and protein.  Subjective:   1. Vitamin D deficiency Benjamin Shepard's Vitamin D level was 55.6 on 03/13/2020. He is currently taking prescription vitamin D 50,000 IU each week. He denies nausea, vomiting or muscle weakness.  2. Diabetes mellitus type 2 in obese Benjamin Shepard) He is taking metformin.  Lab Results  Component Value Date   HGBA1C 5.6 03/13/2020   HGBA1C 6.2 (H) 09/15/2019   HGBA1C 5.9 (H) 05/22/2015   Lab Results  Component Value Date   LDLCALC 52 03/13/2020   CREATININE 0.88 03/13/2020   Lab Results  Component Value Date   INSULIN 23.3 09/15/2019   Assessment/Plan:   1. Vitamin D deficiency Low Vitamin D level contributes to fatigue and are associated with obesity, breast, and colon cancer. He agrees to continue to take prescription Vitamin D @50 ,000 IU every week and will follow-up for routine testing of Vitamin D, at least 2-3 times per year to avoid over-replacement.  - Refill Vitamin D, Ergocalciferol, (DRISDOL) 1.25 MG (50000 UNIT) CAPS capsule; TAKE 1 CAPSULE BY MOUTH EVERY 7 DAYS  Dispense: 4 capsule; Refill: 0  2. Diabetes mellitus type 2 in obese (Benjamin Shepard) Good blood sugar control is important to decrease the likelihood of diabetic complications such as nephropathy, neuropathy, limb loss, blindness, coronary artery disease, and death. Intensive lifestyle  modification including diet, exercise and weight loss are the first line of treatment for diabetes.  Continue metformin.  3. Class 3 severe obesity with serious comorbidity and body mass index (BMI) of 40.0 to 44.9 in adult, unspecified obesity type Benjamin Shepard)  Benjamin Shepard is currently in the action stage of change. As such, his goal is to continue with weight loss efforts. He has Shepard to the Category 4 Plan.   He will work on staying adherent to the plan, meal planning, will weigh protein, and will cut back on breaded items.  Exercise goals: Will increase his walking and use resistance bands.  Behavioral modification strategies: increasing lean protein intake, decreasing simple carbohydrates, increasing vegetables, increasing water intake, decreasing eating out, no skipping meals, meal planning and cooking strategies, keeping healthy foods in the home and planning for success.  Benjamin Shepard to follow-up with our clinic in 3-4 weeks. He was informed of the importance of frequent follow-up visits to maximize his success with intensive lifestyle modifications for his multiple health conditions.   Objective:   Blood pressure 140/66, pulse 63, temperature 98.1 F (36.7 C), height 5\' 10"  (1.778 m), weight (!) 318 lb (144.2 kg), SpO2 97 %. Body mass index is 45.63 kg/m.  General: Cooperative, alert, well developed, in no acute distress. HEENT: Conjunctivae and lids unremarkable. Cardiovascular: Regular rhythm.  Lungs: Normal work of breathing. Neurologic: No focal deficits.   Lab Results  Component Value Date   CREATININE 0.88 03/13/2020   BUN 17 03/13/2020   NA 144 03/13/2020  K 4.5 03/13/2020   CL 104 03/13/2020   CO2 26 03/13/2020   Lab Results  Component Value Date   ALT 24 03/13/2020   AST 16 03/13/2020   ALKPHOS 86 03/13/2020   BILITOT 0.6 03/13/2020   Lab Results  Component Value Date   HGBA1C 5.6 03/13/2020   HGBA1C 6.2 (H) 09/15/2019   HGBA1C 5.9 (H) 05/22/2015   Lab  Results  Component Value Date   INSULIN 23.3 09/15/2019   Lab Results  Component Value Date   TSH 2.570 09/15/2019   Lab Results  Component Value Date   CHOL 107 03/13/2020   HDL 41 03/13/2020   LDLCALC 52 03/13/2020   TRIG 66 03/13/2020   Lab Results  Component Value Date   WBC 7.4 05/22/2015   HGB 11.3 (L) 06/01/2015   HCT 34.8 (L) 06/01/2015   MCV 93.3 05/22/2015   PLT 203 05/22/2015   Obesity Behavioral Intervention:   Approximately 15 minutes were spent on the discussion below.  ASK: We discussed the diagnosis of obesity with Benjamin Shepard today and Benjamin Shepard Shepard to give [place] permission to discuss obesity behavioral modification therapy today.  ASSESS: Benjamin Shepard has the diagnosis of obesity and his BMI today is 45.7. Benjamin Shepard is in the action stage of change.   ADVISE: Benjamin Shepard was educated on the multiple health risks of obesity as well as the benefit of weight loss to improve his health. He was advised of the need for long term treatment and the importance of lifestyle modifications to improve his current health and to decrease his risk of future health problems.  AGREE: Multiple dietary modification options and treatment options were discussed and Benjamin Shepard Shepard to follow the recommendations documented in the above note.  ARRANGE: Benjamin Shepard was educated on the importance of frequent visits to treat obesity as outlined per CMS and USPSTF guidelines and Shepard to schedule his next follow up appointment today.  Attestation Statements:   Reviewed by clinician on day of visit: allergies, medications, problem list, medical history, surgical history, family history, social history, and previous encounter notes.  I, Water quality scientist, CMA, am acting as Location manager for [employer], DO  I have reviewed the above documentation for accuracy and completeness, and I agree with the above. Jearld Lesch, DO

## 2020-05-31 ENCOUNTER — Encounter (INDEPENDENT_AMBULATORY_CARE_PROVIDER_SITE_OTHER): Payer: Self-pay | Admitting: Bariatrics

## 2020-06-13 DIAGNOSIS — H26493 Other secondary cataract, bilateral: Secondary | ICD-10-CM | POA: Diagnosis not present

## 2020-06-21 DIAGNOSIS — E291 Testicular hypofunction: Secondary | ICD-10-CM | POA: Diagnosis not present

## 2020-06-21 DIAGNOSIS — I10 Essential (primary) hypertension: Secondary | ICD-10-CM | POA: Diagnosis not present

## 2020-06-24 ENCOUNTER — Other Ambulatory Visit (INDEPENDENT_AMBULATORY_CARE_PROVIDER_SITE_OTHER): Payer: Self-pay | Admitting: Bariatrics

## 2020-06-24 DIAGNOSIS — E559 Vitamin D deficiency, unspecified: Secondary | ICD-10-CM

## 2020-06-26 ENCOUNTER — Other Ambulatory Visit: Payer: Self-pay

## 2020-06-26 ENCOUNTER — Encounter (INDEPENDENT_AMBULATORY_CARE_PROVIDER_SITE_OTHER): Payer: Self-pay | Admitting: Bariatrics

## 2020-06-26 ENCOUNTER — Ambulatory Visit (INDEPENDENT_AMBULATORY_CARE_PROVIDER_SITE_OTHER): Payer: Medicare Other | Admitting: Bariatrics

## 2020-06-26 VITALS — BP 129/70 | HR 72 | Temp 97.9°F | Ht 70.0 in | Wt 318.0 lb

## 2020-06-26 DIAGNOSIS — E7849 Other hyperlipidemia: Secondary | ICD-10-CM

## 2020-06-26 DIAGNOSIS — Z6841 Body Mass Index (BMI) 40.0 and over, adult: Secondary | ICD-10-CM

## 2020-06-26 DIAGNOSIS — I10 Essential (primary) hypertension: Secondary | ICD-10-CM | POA: Diagnosis not present

## 2020-06-27 ENCOUNTER — Encounter (INDEPENDENT_AMBULATORY_CARE_PROVIDER_SITE_OTHER): Payer: Self-pay | Admitting: Bariatrics

## 2020-06-27 DIAGNOSIS — H52221 Regular astigmatism, right eye: Secondary | ICD-10-CM | POA: Diagnosis not present

## 2020-06-27 DIAGNOSIS — Z961 Presence of intraocular lens: Secondary | ICD-10-CM | POA: Diagnosis not present

## 2020-06-27 DIAGNOSIS — H524 Presbyopia: Secondary | ICD-10-CM | POA: Diagnosis not present

## 2020-06-27 DIAGNOSIS — H5211 Myopia, right eye: Secondary | ICD-10-CM | POA: Diagnosis not present

## 2020-06-27 DIAGNOSIS — Z9849 Cataract extraction status, unspecified eye: Secondary | ICD-10-CM | POA: Diagnosis not present

## 2020-06-27 NOTE — Progress Notes (Signed)
Chief Complaint:   OBESITY Keyontay is here to discuss his progress with his obesity treatment plan along with follow-up of his obesity related diagnoses. Kenner is on the Category 4 Plan and states he is following his eating plan approximately 80% of the time. Reise states he is not exercising regularly.  Today's visit was #: 33 Starting weight: 342 lbs Starting date: 09/15/2019 Today's weight: 318 lbs Today's date: 06/26/2020 Total lbs lost to date: 24 lbs Total lbs lost since last in-office visit: 0  Interim History: Sir's weight remains the same as at his last visit.  He has had eye surgery since his last visit.  Subjective:   1. Essential hypertension Controlled.  Review: taking medications as instructed, no medication side effects noted, no chest pain on exertion, no dyspnea on exertion, no swelling of ankles.    BP Readings from Last 3 Encounters:  06/26/20 129/70  05/29/20 140/66  05/08/20 119/66   2. Other hyperlipidemia Zeplin has hyperlipidemia and has been trying to improve his cholesterol levels with intensive lifestyle modification including a low saturated fat diet, exercise and weight loss. He denies any chest pain, claudication or myalgias.  He is taking Lipitor.  Lab Results  Component Value Date   ALT 24 03/13/2020   AST 16 03/13/2020   ALKPHOS 86 03/13/2020   BILITOT 0.6 03/13/2020   Lab Results  Component Value Date   CHOL 107 03/13/2020   HDL 41 03/13/2020   LDLCALC 52 03/13/2020   TRIG 66 03/13/2020   Assessment/Plan:   1. Essential hypertension Kyrillos is working on healthy weight loss and exercise to improve blood pressure control. We will watch for signs of hypotension as he continues his lifestyle modifications.  Continue medications.  No added salt; avoid foods with salt.  2. Other hyperlipidemia Cardiovascular risk and specific lipid/LDL goals reviewed.  We discussed several lifestyle modifications today and Taydon will continue to  work on diet, exercise and weight loss efforts. Orders and follow up as documented in patient record.  Continue Lipitor.  Decrease carbohydrates, increase healthy fats.  Counseling Intensive lifestyle modifications are the first line treatment for this issue. . Dietary changes: Increase soluble fiber. Decrease simple carbohydrates. . Exercise changes: Moderate to vigorous-intensity aerobic activity 150 minutes per week if tolerated. . Lipid-lowering medications: see documented in medical record.  3. Class 3 severe obesity with serious comorbidity and body mass index (BMI) of 45.0 to 49.9 in adult, unspecified obesity type [facility])  Cordell is currently in the action stage of change. As such, his goal is to continue with weight loss efforts. He has agreed to the Category 4 Plan.   He will work on meal planning, increasing activity/exercise, and will remain strongly adherent to the plan.  Exercise goals: Will increase exercise.  Walking and will work with bands.  Behavioral modification strategies: increasing lean protein intake, decreasing simple carbohydrates, increasing vegetables, increasing water intake, decreasing eating out, no skipping meals, meal planning and cooking strategies, keeping healthy foods in the home and planning for success.  Keltin has agreed to follow-up with our clinic in 2-3 weeks. He was informed of the importance of frequent follow-up visits to maximize his success with intensive lifestyle modifications for his multiple health conditions.   Objective:   Blood pressure 129/70, pulse 72, temperature 97.9 F (36.6 C), height 5\' 10"  (1.778 m), weight (!) 318 lb (144.2 kg), SpO2 96 %. Body mass index is 45.63 kg/m.  General: Cooperative, alert, well developed, in  no acute distress. HEENT: Conjunctivae and lids unremarkable. Cardiovascular: Regular rhythm.  Lungs: Normal work of breathing. Neurologic: No focal deficits.   Lab Results  Component Value Date    CREATININE 0.88 03/13/2020   BUN 17 03/13/2020   NA 144 03/13/2020   K 4.5 03/13/2020   CL 104 03/13/2020   CO2 26 03/13/2020   Lab Results  Component Value Date   ALT 24 03/13/2020   AST 16 03/13/2020   ALKPHOS 86 03/13/2020   BILITOT 0.6 03/13/2020   Lab Results  Component Value Date   HGBA1C 5.6 03/13/2020   HGBA1C 6.2 (H) 09/15/2019   HGBA1C 5.9 (H) 05/22/2015   Lab Results  Component Value Date   INSULIN 23.3 09/15/2019   Lab Results  Component Value Date   TSH 2.570 09/15/2019   Lab Results  Component Value Date   CHOL 107 03/13/2020   HDL 41 03/13/2020   LDLCALC 52 03/13/2020   TRIG 66 03/13/2020   Lab Results  Component Value Date   WBC 7.4 05/22/2015   HGB 11.3 (L) 06/01/2015   HCT 34.8 (L) 06/01/2015   MCV 93.3 05/22/2015   PLT 203 05/22/2015   Obesity Behavioral Intervention:   Approximately 15 minutes were spent on the discussion below.  ASK: We discussed the diagnosis of obesity with [name] today and Lequan agreed to give [place] permission to discuss obesity behavioral modification therapy today.  ASSESS: Hubert has the diagnosis of obesity and his BMI today is 45.6. Jerimah is in the action stage of change.   ADVISE: Jacek was educated on the multiple health risks of obesity as well as the benefit of weight loss to improve his health. He was advised of the need for long term treatment and the importance of lifestyle modifications to improve his current health and to decrease his risk of future health problems.  AGREE: Multiple dietary modification options and treatment options were discussed and Haddon agreed to follow the recommendations documented in the above note.  ARRANGE: Revis was educated on the importance of frequent visits to treat obesity as outlined per CMS and USPSTF guidelines and agreed to schedule his next follow up appointment today.  Attestation Statements:   Reviewed by clinician on day of visit: allergies, medications,  problem list, medical history, surgical history, family history, social history, and previous encounter notes.  I, Water quality scientist, CMA, am acting as Location manager for [employer], DO  I have reviewed the above documentation for accuracy and completeness, and I agree with the above. Jearld Lesch, DO

## 2020-06-28 ENCOUNTER — Other Ambulatory Visit: Payer: Self-pay | Admitting: Interventional Cardiology

## 2020-06-29 DIAGNOSIS — E291 Testicular hypofunction: Secondary | ICD-10-CM | POA: Diagnosis not present

## 2020-07-17 ENCOUNTER — Ambulatory Visit (INDEPENDENT_AMBULATORY_CARE_PROVIDER_SITE_OTHER): Payer: Medicare Other | Admitting: Bariatrics

## 2020-07-17 ENCOUNTER — Encounter (INDEPENDENT_AMBULATORY_CARE_PROVIDER_SITE_OTHER): Payer: Self-pay | Admitting: Bariatrics

## 2020-07-17 ENCOUNTER — Other Ambulatory Visit: Payer: Self-pay

## 2020-07-17 VITALS — BP 126/66 | HR 56 | Temp 97.7°F | Ht 70.0 in | Wt 320.0 lb

## 2020-07-17 DIAGNOSIS — E1169 Type 2 diabetes mellitus with other specified complication: Secondary | ICD-10-CM

## 2020-07-17 DIAGNOSIS — E559 Vitamin D deficiency, unspecified: Secondary | ICD-10-CM | POA: Diagnosis not present

## 2020-07-17 DIAGNOSIS — Z6841 Body Mass Index (BMI) 40.0 and over, adult: Secondary | ICD-10-CM

## 2020-07-17 DIAGNOSIS — E669 Obesity, unspecified: Secondary | ICD-10-CM

## 2020-07-17 MED ORDER — VITAMIN D (ERGOCALCIFEROL) 1.25 MG (50000 UNIT) PO CAPS
ORAL_CAPSULE | ORAL | 0 refills | Status: DC
Start: 2020-07-17 — End: 2020-09-11

## 2020-07-18 ENCOUNTER — Encounter (INDEPENDENT_AMBULATORY_CARE_PROVIDER_SITE_OTHER): Payer: Self-pay | Admitting: Bariatrics

## 2020-07-18 NOTE — Progress Notes (Signed)
Chief Complaint:   OBESITY Benjamin Shepard is here to discuss his progress with his obesity treatment plan along with follow-up of his obesity related diagnoses. Benjamin Shepard is on the Category 4 Plan and states he is following his eating plan approximately 70% of the time. Benjamin Shepard states he is doing band exercises for 10 minutes 2-3 times per week.  Today's visit was #: 18 Starting weight: 342 lbs Starting date: 09/15/2019 Today's weight: 320 lbs Today's date: 07/17/2020 Total lbs lost to date: 22 lbs Total lbs lost since last in-office visit: 0  Interim History: Benjamin Shepard is up 2 pounds since his last visit.  He went to eat with family.  Subjective:   1. Vitamin D deficiency Benjamin Shepard's Vitamin D level was 55.6 on 03/13/2020. He is currently taking prescription vitamin D 50,000 IU each week. He denies nausea, vomiting or muscle weakness.  2. Diabetes mellitus type 2 in obese Benjamin Shepard) He is taking metformin.  Lab Results  Component Value Date   HGBA1C 5.6 03/13/2020   HGBA1C 6.2 (H) 09/15/2019   HGBA1C 5.9 (H) 05/22/2015   Lab Results  Component Value Date   LDLCALC 52 03/13/2020   CREATININE 0.88 03/13/2020   Lab Results  Component Value Date   INSULIN 23.3 09/15/2019   Assessment/Plan:   1. Vitamin D deficiency Low Vitamin D level contributes to fatigue and are associated with obesity, breast, and colon cancer. He agrees to continue to take prescription Vitamin D @50 ,000 IU every week and will follow-up for routine testing of Vitamin D, at least 2-3 times per year to avoid over-replacement.  - Refill Vitamin D, Ergocalciferol, (DRISDOL) 1.25 MG (50000 UNIT) CAPS capsule; TAKE 1 CAPSULE BY MOUTH EVERY 7 DAYS  Dispense: 4 capsule; Refill: 0  2. Diabetes mellitus type 2 in obese (HCC) Good blood sugar control is important to decrease the likelihood of diabetic complications such as nephropathy, neuropathy, limb loss, blindness, coronary artery disease, and death. Intensive lifestyle  modification including diet, exercise and weight loss are the first line of treatment for diabetes.  Continue metformin.  Continue decreasing carbohydrates and increasing exercise.  3. Obesity, current BMI 39  Benjamin Shepard is currently in the action stage of change. As such, his goal is to continue with weight loss efforts. He has agreed to the Category 4 Plan.   He will work on meal planning, will be more adherent to the plan, will not skip meals, and will increase his protein intake.  Exercise goals: Continue band exercises and walking.  Behavioral modification strategies: increasing lean protein intake, decreasing simple carbohydrates, increasing vegetables, increasing water intake, decreasing eating out, no skipping meals, meal planning and cooking strategies, keeping healthy foods in the home and planning for success.  Benjamin Shepard has agreed to follow-up with our clinic in 3-4 weeks. He was informed of the importance of frequent follow-up visits to maximize his success with intensive lifestyle modifications for his multiple health conditions.   Objective:   Blood pressure 126/66, pulse (!) 56, temperature 97.7 F (36.5 C), height 5\' 10"  (1.778 m), weight (!) 320 lb (145.2 kg), SpO2 96 %. Body mass index is 45.92 kg/m.  General: Cooperative, alert, well developed, in no acute distress. HEENT: Conjunctivae and lids unremarkable. Cardiovascular: Regular rhythm.  Lungs: Normal work of breathing. Neurologic: No focal deficits.   Lab Results  Component Value Date   CREATININE 0.88 03/13/2020   BUN 17 03/13/2020   NA 144 03/13/2020   K 4.5 03/13/2020   CL 104 03/13/2020  CO2 26 03/13/2020   Lab Results  Component Value Date   ALT 24 03/13/2020   AST 16 03/13/2020   ALKPHOS 86 03/13/2020   BILITOT 0.6 03/13/2020   Lab Results  Component Value Date   HGBA1C 5.6 03/13/2020   HGBA1C 6.2 (H) 09/15/2019   HGBA1C 5.9 (H) 05/22/2015   Lab Results  Component Value Date   INSULIN 23.3  09/15/2019   Lab Results  Component Value Date   TSH 2.570 09/15/2019   Lab Results  Component Value Date   CHOL 107 03/13/2020   HDL 41 03/13/2020   LDLCALC 52 03/13/2020   TRIG 66 03/13/2020   Lab Results  Component Value Date   WBC 7.4 05/22/2015   HGB 11.3 (L) 06/01/2015   HCT 34.8 (L) 06/01/2015   MCV 93.3 05/22/2015   PLT 203 05/22/2015   Obesity Behavioral Intervention:   Approximately 15 minutes were spent on the discussion below.  ASK: We discussed the diagnosis of obesity with Benjamin Shepard today and Benjamin Shepard agreed to give [place] permission to discuss obesity behavioral modification therapy today.  ASSESS: Benjamin Shepard has the diagnosis of obesity and his BMI today is 45.9. Benjamin Shepard is in the action stage of change.   ADVISE: Benjamin Shepard was educated on the multiple health risks of obesity as well as the benefit of weight loss to improve his health. He was advised of the need for long term treatment and the importance of lifestyle modifications to improve his current health and to decrease his risk of future health problems.  AGREE: Multiple dietary modification options and treatment options were discussed and Benjamin Shepard agreed to follow the recommendations documented in the above note.  ARRANGE: Benjamin Shepard was educated on the importance of frequent visits to treat obesity as outlined per CMS and USPSTF guidelines and agreed to schedule his next follow up appointment today.  Attestation Statements:   Reviewed by clinician on day of visit: allergies, medications, problem list, medical history, surgical history, family history, social history, and previous encounter notes.  I, Water quality scientist, CMA, am acting as Location manager for [employer], DO  I have reviewed the above documentation for accuracy and completeness, and I agree with the above. Jearld Lesch, DO

## 2020-07-20 DIAGNOSIS — E291 Testicular hypofunction: Secondary | ICD-10-CM | POA: Diagnosis not present

## 2020-08-10 DIAGNOSIS — E291 Testicular hypofunction: Secondary | ICD-10-CM | POA: Diagnosis not present

## 2020-08-14 ENCOUNTER — Encounter (INDEPENDENT_AMBULATORY_CARE_PROVIDER_SITE_OTHER): Payer: Self-pay | Admitting: Bariatrics

## 2020-08-14 ENCOUNTER — Ambulatory Visit (INDEPENDENT_AMBULATORY_CARE_PROVIDER_SITE_OTHER): Payer: Medicare Other | Admitting: Bariatrics

## 2020-08-14 ENCOUNTER — Other Ambulatory Visit: Payer: Self-pay

## 2020-08-14 VITALS — BP 131/66 | HR 66 | Temp 98.6°F | Ht 70.0 in | Wt 320.0 lb

## 2020-08-14 DIAGNOSIS — E669 Obesity, unspecified: Secondary | ICD-10-CM

## 2020-08-14 DIAGNOSIS — E1169 Type 2 diabetes mellitus with other specified complication: Secondary | ICD-10-CM | POA: Diagnosis not present

## 2020-08-14 DIAGNOSIS — I10 Essential (primary) hypertension: Secondary | ICD-10-CM | POA: Diagnosis not present

## 2020-08-14 DIAGNOSIS — Z6841 Body Mass Index (BMI) 40.0 and over, adult: Secondary | ICD-10-CM | POA: Diagnosis not present

## 2020-08-15 ENCOUNTER — Encounter: Payer: Self-pay | Admitting: Podiatry

## 2020-08-15 ENCOUNTER — Ambulatory Visit (INDEPENDENT_AMBULATORY_CARE_PROVIDER_SITE_OTHER): Payer: Medicare Other | Admitting: Podiatry

## 2020-08-15 ENCOUNTER — Encounter (INDEPENDENT_AMBULATORY_CARE_PROVIDER_SITE_OTHER): Payer: Self-pay | Admitting: Bariatrics

## 2020-08-15 DIAGNOSIS — E119 Type 2 diabetes mellitus without complications: Secondary | ICD-10-CM

## 2020-08-15 DIAGNOSIS — B351 Tinea unguium: Secondary | ICD-10-CM

## 2020-08-15 DIAGNOSIS — M79675 Pain in left toe(s): Secondary | ICD-10-CM

## 2020-08-15 DIAGNOSIS — M79674 Pain in right toe(s): Secondary | ICD-10-CM | POA: Diagnosis not present

## 2020-08-15 NOTE — Progress Notes (Signed)
This patient returns to my office for at risk foot care.  This patient requires this care by a professional since this patient will be at risk due to having  diabetes.This patient is unable to cut nails themselves since the patient cannot reach their nails.These nails are painful walking and wearing shoes.  This patient presents for at risk foot care today.  General Appearance  Alert, conversant and in no acute stress.  Vascular  Dorsalis pedis  are palpable  bilaterally. Posterior tibial pulses are not palpable. Capillary return is within normal limits  bilaterally. Temperature is within normal limits  bilaterally.  Neurologic  Senn-Weinstein monofilament wire test within normal limits  bilaterally. Muscle power within normal limits bilaterally.  Nails Thick disfigured discolored nails with subungual debris  from hallux to fifth toes bilaterally. No evidence of bacterial infection or drainage bilaterally.  Orthopedic  No limitations of motion  feet .  No crepitus or effusions noted.  No bony pathology or digital deformities noted.  Mild  HAV  B/L.  Skin  normotropic skin with no porokeratosis noted bilaterally.  No signs of infections or ulcers noted.     Onychomycosis  Pain in right toe  Pain in left toe.  Consent was obtained for treatment procedures.  Debridement and grinding of long thick nails with clearing of subungual debris.  No infection or ulcer.     Return office visit  3 months        Told patient to return for periodic foot care and evaluation due to potential at risk complications.   Salli Bodin DPM  

## 2020-08-15 NOTE — Progress Notes (Signed)
Chief Complaint:   OBESITY Benjamin Shepard is here to discuss his progress with his obesity treatment plan along with follow-up of his obesity related diagnoses. Benjamin Shepard is on the Category 4 Plan and states he is following his eating plan approximately 75% of the time. Benjamin Shepard states he is doing 0 minutes 0 times per week.  Today's visit was #: 25 Starting weight: 342 lbs Starting date: 09/15/2019 Today's weight: 320 lbs Today's date: 08/14/2020 Total lbs lost to date: 22 Total lbs lost since last in-office visit: 0  Interim History: Benjamin Shepard's weight remains the same. He is doing well with his water.  Subjective:   1. Diabetes mellitus type 2 in obese Benjamin Shepard is taking metformin currently.  2. Essential hypertension Benjamin Shepard's blood pressure is controlled.   Assessment/Plan:   1. Diabetes mellitus type 2 in obese Benjamin Shepard will continue metformin, and will continue to decrease simple carbohydrates. Good blood sugar control is important to decrease the likelihood of diabetic complications such as nephropathy, neuropathy, limb loss, blindness, coronary artery disease, and death. Intensive lifestyle modification including diet, exercise and weight loss are the first line of treatment for diabetes.   2. Essential hypertension Benjamin Shepard will continue his medications. He will continue working on healthy weight loss and exercise to improve blood pressure control. We will watch for signs of hypotension as he continues his lifestyle modifications.  3. Obesity, current BMI 2 Benjamin Shepard is currently in the action stage of change. As such, his goal is to continue with weight loss efforts. He has agreed to the Category 4 Plan.   Benjamin Shepard will adhere closely to the meal plan. He is to weigh his meat portions.  Exercise goals: Gradually increase activity.  Behavioral modification strategies: increasing lean protein intake, decreasing simple carbohydrates, increasing vegetables, increasing water  intake, decreasing eating out, no skipping meals, meal planning and cooking strategies, keeping healthy foods in the home and planning for success.  Benjamin Shepard has agreed to follow-up with our clinic in 4 weeks. He was informed of the importance of frequent follow-up visits to maximize his success with intensive lifestyle modifications for his multiple health conditions.   Objective:   Blood pressure 131/66, pulse 66, temperature 98.6 F (37 C), height 5\' 10"  (1.778 m), weight (!) 320 lb (145.2 kg), SpO2 97 %. Body mass index is 45.92 kg/m.  General: Cooperative, alert, well developed, in no acute distress. HEENT: Conjunctivae and lids unremarkable. Cardiovascular: Regular rhythm.  Lungs: Normal work of breathing. Neurologic: No focal deficits.   Lab Results  Component Value Date   CREATININE 0.88 03/13/2020   BUN 17 03/13/2020   NA 144 03/13/2020   K 4.5 03/13/2020   CL 104 03/13/2020   CO2 26 03/13/2020   Lab Results  Component Value Date   ALT 24 03/13/2020   AST 16 03/13/2020   ALKPHOS 86 03/13/2020   BILITOT 0.6 03/13/2020   Lab Results  Component Value Date   HGBA1C 5.6 03/13/2020   HGBA1C 6.2 (H) 09/15/2019   HGBA1C 5.9 (H) 05/22/2015   Lab Results  Component Value Date   INSULIN 23.3 09/15/2019   Lab Results  Component Value Date   TSH 2.570 09/15/2019   Lab Results  Component Value Date   CHOL 107 03/13/2020   HDL 41 03/13/2020   LDLCALC 52 03/13/2020   TRIG 66 03/13/2020   Lab Results  Component Value Date   WBC 7.4 05/22/2015   HGB 11.3 (L) 06/01/2015   HCT 34.8 (L)  06/01/2015   MCV 93.3 05/22/2015   PLT 203 05/22/2015   No results found for: IRON, TIBC, FERRITIN  Obesity Behavioral Intervention:   Approximately 15 minutes were spent on the discussion below.  ASK: We discussed the diagnosis of obesity with Benjamin Shepard today and Benjamin Shepard agreed to give [place] permission to discuss obesity behavioral modification therapy today.  ASSESS: Benjamin Shepard has the  diagnosis of obesity and his BMI today is 45.92. Benjamin Shepard is in the action stage of change.   ADVISE: Benjamin Shepard was educated on the multiple health risks of obesity as well as the benefit of weight loss to improve his health. He was advised of the need for long term treatment and the importance of lifestyle modifications to improve his current health and to decrease his risk of future health problems.  AGREE: Multiple dietary modification options and treatment options were discussed and Benjamin Shepard agreed to follow the recommendations documented in the above note.  ARRANGE: Benjamin Shepard was educated on the importance of frequent visits to treat obesity as outlined per CMS and USPSTF guidelines and agreed to schedule his next follow up appointment today.  Attestation Statements:   Reviewed by clinician on day of visit: allergies, medications, problem list, medical history, surgical history, family history, social history, and previous encounter notes.   Wilhemena Durie, am acting as Location manager for [employer], DO.  I have reviewed the above documentation for accuracy and completeness, and I agree with the above. Jearld Lesch, DO

## 2020-08-31 DIAGNOSIS — E291 Testicular hypofunction: Secondary | ICD-10-CM | POA: Diagnosis not present

## 2020-09-11 ENCOUNTER — Ambulatory Visit (INDEPENDENT_AMBULATORY_CARE_PROVIDER_SITE_OTHER): Payer: Medicare Other | Admitting: Bariatrics

## 2020-09-11 ENCOUNTER — Encounter (INDEPENDENT_AMBULATORY_CARE_PROVIDER_SITE_OTHER): Payer: Self-pay | Admitting: Bariatrics

## 2020-09-11 ENCOUNTER — Other Ambulatory Visit: Payer: Self-pay

## 2020-09-11 VITALS — BP 145/70 | HR 63 | Temp 97.7°F | Ht 70.0 in | Wt 326.0 lb

## 2020-09-11 DIAGNOSIS — E559 Vitamin D deficiency, unspecified: Secondary | ICD-10-CM | POA: Diagnosis not present

## 2020-09-11 DIAGNOSIS — E66813 Obesity, class 3: Secondary | ICD-10-CM

## 2020-09-11 DIAGNOSIS — E669 Obesity, unspecified: Secondary | ICD-10-CM

## 2020-09-11 DIAGNOSIS — E1169 Type 2 diabetes mellitus with other specified complication: Secondary | ICD-10-CM

## 2020-09-11 DIAGNOSIS — Z6841 Body Mass Index (BMI) 40.0 and over, adult: Secondary | ICD-10-CM | POA: Diagnosis not present

## 2020-09-11 MED ORDER — VITAMIN D (ERGOCALCIFEROL) 1.25 MG (50000 UNIT) PO CAPS: ORAL_CAPSULE | ORAL | 0 refills | Status: DC

## 2020-09-12 DIAGNOSIS — E291 Testicular hypofunction: Secondary | ICD-10-CM | POA: Diagnosis not present

## 2020-09-13 NOTE — Progress Notes (Signed)
Chief Complaint:   OBESITY Benjamin Shepard is here to discuss his progress with his obesity treatment plan along with follow-up of his obesity related diagnoses. Benjamin Shepard is on the Category 4 Plan and states he is following his eating plan approximately 75% of the time. Benjamin Shepard states he is stretching more.  Today's visit was #: 20 Starting weight: 342 lbs Starting date: 09/15/2019 Today's weight: 326 lbs Today's date: 09/11/2020 Total lbs lost to date: 16 Total lbs lost since last in-office visit: 0  Interim History: Benjamin Shepard is up 6 lbs. He got off the plan too much. He has had celebrations and eating out. He is doing ok with his water intake.  Subjective:   1. Vitamin D deficiency Benjamin Shepard denies nausea, vomiting, and muscle weakness. Pt is on prescription Vit D every 2 weeks.  2. Diabetes mellitus type 2 in obese [facility]) Benjamin Shepard is taking Glucophage.  Lab Results  Component Value Date   HGBA1C 5.6 03/13/2020   HGBA1C 6.2 (H) 09/15/2019   HGBA1C 5.9 (H) 05/22/2015   Lab Results  Component Value Date   LDLCALC 52 03/13/2020   CREATININE 0.88 03/13/2020   Lab Results  Component Value Date   INSULIN 23.3 09/15/2019    Assessment/Plan:   1. Vitamin D deficiency Low Vitamin D level contributes to fatigue and are associated with obesity, breast, and colon cancer. He agrees to continue to take prescription Vitamin D @50 ,000 IU every week and will follow-up for routine testing of Vitamin D, at least 2-3 times per year to avoid over-replacement.  - Vitamin D, Ergocalciferol, (DRISDOL) 1.25 MG (50000 UNIT) CAPS capsule; TAKE 1 CAPSULE BY MOUTH EVERY 7 DAYS (Patient taking differently: TAKE 1 CAPSULE BY MOUTH EVERY 14 DAYS)  Dispense: 4 capsule; Refill: 0  2. Diabetes mellitus type 2 in obese (HCC) Good blood sugar control is important to decrease the likelihood of diabetic complications such as nephropathy, neuropathy, limb loss, blindness, coronary artery disease, and death. Intensive  lifestyle modification including diet, exercise and weight loss are the first line of treatment for diabetes. Continue current treatment plan.  3. Obesity, current BMI 22  Benjamin Shepard is currently in the action stage of change. As such, his goal is to continue with weight loss efforts. He has agreed to the Category 4 Plan.   Meal plan Intentional eating Remove "tempting" snacks Decrease carbs  Exercise goals:  stretching more- back pain with walking.  Behavioral modification strategies: increasing lean protein intake, decreasing simple carbohydrates, increasing vegetables, increasing water intake, decreasing eating out, no skipping meals, meal planning and cooking strategies, keeping healthy foods in the home, and planning for success.  Benjamin Shepard has agreed to follow-up with our clinic in 3-4 weeks- fasting. He was informed of the importance of frequent follow-up visits to maximize his success with intensive lifestyle modifications for his multiple health conditions.   Objective:   Blood pressure (!) 145/70, pulse 63, temperature 97.7 F (36.5 C), height 5\' 10"  (1.778 m), weight (!) 326 lb (147.9 kg), SpO2 97 %. Body mass index is 46.78 kg/m.  General: Cooperative, alert, well developed, in no acute distress. HEENT: Conjunctivae and lids unremarkable. Cardiovascular: Regular rhythm.  Lungs: Normal work of breathing. Neurologic: No focal deficits.   Lab Results  Component Value Date   CREATININE 0.88 03/13/2020   BUN 17 03/13/2020   NA 144 03/13/2020   K 4.5 03/13/2020   CL 104 03/13/2020   CO2 26 03/13/2020   Lab Results  Component Value Date  ALT 24 03/13/2020   AST 16 03/13/2020   ALKPHOS 86 03/13/2020   BILITOT 0.6 03/13/2020   Lab Results  Component Value Date   HGBA1C 5.6 03/13/2020   HGBA1C 6.2 (H) 09/15/2019   HGBA1C 5.9 (H) 05/22/2015   Lab Results  Component Value Date   INSULIN 23.3 09/15/2019   Lab Results  Component Value Date   TSH 2.570 09/15/2019    Lab Results  Component Value Date   CHOL 107 03/13/2020   HDL 41 03/13/2020   LDLCALC 52 03/13/2020   TRIG 66 03/13/2020   Lab Results  Component Value Date   WBC 7.4 05/22/2015   HGB 11.3 (L) 06/01/2015   HCT 34.8 (L) 06/01/2015   MCV 93.3 05/22/2015   PLT 203 05/22/2015   No results found for: IRON, TIBC, FERRITIN  Obesity Behavioral Intervention:   Approximately 15 minutes were spent on the discussion below.  ASK: We discussed the diagnosis of obesity with Benjamin Shepard today and Benjamin Shepard agreed to give [place] permission to discuss obesity behavioral modification therapy today.  ASSESS: Benjamin Shepard has the diagnosis of obesity and his BMI today is 46.9. Benjamin Shepard is in the action stage of change.   ADVISE: Benjamin Shepard was educated on the multiple health risks of obesity as well as the benefit of weight loss to improve his health. He was advised of the need for long term treatment and the importance of lifestyle modifications to improve his current health and to decrease his risk of future health problems.  AGREE: Multiple dietary modification options and treatment options were discussed and Benjamin Shepard agreed to follow the recommendations documented in the above note.  ARRANGE: Benjamin Shepard was educated on the importance of frequent visits to treat obesity as outlined per CMS and USPSTF guidelines and agreed to schedule his next follow up appointment today.  Attestation Statements:   Reviewed by clinician on day of visit: allergies, medications, problem list, medical history, surgical history, family history, social history, and previous encounter notes.  Coral Ceo, CMA, am acting as Location manager for [employer], DO.  I have reviewed the above documentation for accuracy and completeness, and I agree with the above. Jearld Lesch, DO

## 2020-09-20 ENCOUNTER — Encounter (INDEPENDENT_AMBULATORY_CARE_PROVIDER_SITE_OTHER): Payer: Self-pay | Admitting: Bariatrics

## 2020-09-21 DIAGNOSIS — E291 Testicular hypofunction: Secondary | ICD-10-CM | POA: Diagnosis not present

## 2020-10-17 ENCOUNTER — Encounter (INDEPENDENT_AMBULATORY_CARE_PROVIDER_SITE_OTHER): Payer: Self-pay | Admitting: Bariatrics

## 2020-10-17 ENCOUNTER — Other Ambulatory Visit: Payer: Self-pay

## 2020-10-17 ENCOUNTER — Ambulatory Visit (INDEPENDENT_AMBULATORY_CARE_PROVIDER_SITE_OTHER): Payer: Medicare Other | Admitting: Bariatrics

## 2020-10-17 VITALS — BP 156/68 | HR 59 | Temp 97.8°F | Ht 70.0 in | Wt 327.0 lb

## 2020-10-17 DIAGNOSIS — E559 Vitamin D deficiency, unspecified: Secondary | ICD-10-CM

## 2020-10-17 DIAGNOSIS — E1169 Type 2 diabetes mellitus with other specified complication: Secondary | ICD-10-CM | POA: Diagnosis not present

## 2020-10-17 DIAGNOSIS — E7849 Other hyperlipidemia: Secondary | ICD-10-CM

## 2020-10-17 DIAGNOSIS — Z6841 Body Mass Index (BMI) 40.0 and over, adult: Secondary | ICD-10-CM | POA: Diagnosis not present

## 2020-10-17 DIAGNOSIS — I10 Essential (primary) hypertension: Secondary | ICD-10-CM | POA: Diagnosis not present

## 2020-10-17 DIAGNOSIS — Z20822 Contact with and (suspected) exposure to covid-19: Secondary | ICD-10-CM | POA: Diagnosis not present

## 2020-10-17 MED ORDER — VITAMIN D (ERGOCALCIFEROL) 1.25 MG (50000 UNIT) PO CAPS: ORAL_CAPSULE | ORAL | 0 refills | Status: DC

## 2020-10-18 LAB — HEMOGLOBIN A1C
Est. average glucose Bld gHb Est-mCnc: 114 mg/dL
Hgb A1c MFr Bld: 5.6 % (ref 4.8–5.6)

## 2020-10-18 LAB — COMPREHENSIVE METABOLIC PANEL
ALT: 21 IU/L (ref 0–44)
AST: 14 IU/L (ref 0–40)
Albumin/Globulin Ratio: 1.8 (ref 1.2–2.2)
Albumin: 4.3 g/dL (ref 3.7–4.7)
Alkaline Phosphatase: 91 IU/L (ref 44–121)
BUN/Creatinine Ratio: 19 (ref 10–24)
BUN: 17 mg/dL (ref 8–27)
Bilirubin Total: 0.5 mg/dL (ref 0.0–1.2)
CO2: 27 mmol/L (ref 20–29)
Calcium: 9.8 mg/dL (ref 8.6–10.2)
Chloride: 103 mmol/L (ref 96–106)
Creatinine, Ser: 0.91 mg/dL (ref 0.76–1.27)
Globulin, Total: 2.4 g/dL (ref 1.5–4.5)
Glucose: 119 mg/dL — ABNORMAL HIGH (ref 65–99)
Potassium: 4.3 mmol/L (ref 3.5–5.2)
Sodium: 144 mmol/L (ref 134–144)
Total Protein: 6.7 g/dL (ref 6.0–8.5)
eGFR: 87 mL/min/{1.73_m2} (ref >59)

## 2020-10-18 LAB — LIPID PANEL WITH LDL/HDL RATIO
Cholesterol, Total: 102 mg/dL (ref 100–199)
HDL: 42 mg/dL (ref >39)
LDL Chol Calc (NIH): 48 mg/dL (ref 0–99)
LDL/HDL Ratio: 1.1 ratio (ref 0.0–3.6)
Triglycerides: 49 mg/dL (ref 0–149)
VLDL Cholesterol Cal: 12 mg/dL (ref 5–40)

## 2020-10-18 LAB — VITAMIN D 25 HYDROXY (VIT D DEFICIENCY, FRACTURES): Vit D, 25-Hydroxy: 56.4 ng/mL (ref 30.0–100.0)

## 2020-10-18 LAB — INSULIN, RANDOM: INSULIN: 17.8 u[IU]/mL (ref 2.6–24.9)

## 2020-10-19 DIAGNOSIS — R058 Other specified cough: Secondary | ICD-10-CM | POA: Diagnosis not present

## 2020-10-19 DIAGNOSIS — U071 COVID-19: Secondary | ICD-10-CM | POA: Diagnosis not present

## 2020-10-24 NOTE — Progress Notes (Signed)
Chief Complaint:   OBESITY Benjamin Shepard is here to discuss his progress with his obesity treatment plan along with follow-up of his obesity related diagnoses. Benjamin Shepard is on the Category 4 Plan and states he is following his eating plan approximately 80% of the time. Benjamin Shepard states he is doing 0 for 0 minutes 0 times per week.  Today's visit was #: 21 Starting weight: 342 lbs Starting date: 09/15/2019 Today's weight: 327 lbs Today's date:10/17/2020 Total lbs lost to date: 15 lbs Total lbs lost since last in-office visit: 0  Interim History: Benjamin Shepard is up 1 lb since his last visit. He has been to the beach last week.  Subjective:   1. Vitamin D deficiency Benjamin Shepard will continue taking Vitamin D as directed.  2. Essential hypertension Benjamin Shepard is taking Tiazac, Cardura, HCTZ and Cozaar. His blood pressure was elevated today.  3. Type 2 diabetes mellitus with other specified complication, without long-term current use of insulin (HCC) Benjamin Shepard is taking Glucophage and he denies hypoglycemia.  4. Other hyperlipidemia Medication(s) reviewed. Patient denies myalgias.   Assessment/Plan:   1. Vitamin D deficiency Low Vitamin D level contributes to fatigue and are associated with obesity, breast, and colon cancer. Benjamin Shepard agrees to continue to take prescription Vitamin D 50,000 IU every week and we will refill his Vitamin D for 1 month with no refills and he will follow-up for routine testing of Vitamin D, at least 2-3 times per year to avoid over-replacement. We will check labs today.  - Vitamin D, Ergocalciferol, (DRISDOL) 1.25 MG (50000 UNIT) CAPS capsule; TAKE 1 CAPSULE BY MOUTH EVERY 14 DAYS  Dispense: 4 capsule; Refill: 0 - VITAMIN D 25 Hydroxy (Vit-D Deficiency, Fractures)  2. Essential hypertension Benjamin Shepard will continue his medication. He will follow up with PCP for BP if it remains elevated. He took his medication just before coming here. He checked his bp at home, it was 140/90. He called  PCP. We will check labs today. We will watch for signs of hypotension as he continues his lifestyle modifications.  - Comprehensive metabolic panel  3. Type 2 diabetes mellitus with other specified complication, without long-term current use of insulin (HCC) Good blood sugar control is important to decrease the likelihood of diabetic complications such as nephropathy, neuropathy, limb loss, blindness, coronary artery disease, and death. We will check labs today and Benjamin Shepard will continue intensive lifestyle modification including diet, exercise and weight loss are the first line of treatment for diabetes.   - Insulin, random - Hemoglobin A1c  4. Other hyperlipidemia Cardiovascular risk and specific lipid/LDL goals reviewed.  We discussed several lifestyle modifications today and Benjamin Shepard will continue to work on diet, exercise and weight loss efforts. Orders and follow up as documented in patient record.   Counseling Intensive lifestyle modifications are the first line treatment for this issue. Dietary changes: Increase soluble fiber. Decrease simple carbohydrates. Exercise changes: Moderate to vigorous-intensity aerobic activity 150 minutes per week if tolerated. Lipid-lowering medications: see documented in medical record.  - Lipid Panel With LDL/HDL Ratio  5. Obesity, current BMI 46.9 Benjamin Shepard is currently in the action stage of change. As such, his goal is to continue with weight loss efforts. He has agreed to the Category 4 Plan.   Benjamin Shepard will continue to meal plan. He will decrease eating out. He will adhere closely to the plan. He will get back on track.  Exercise goals:  Benjamin Shepard will be active at home.  Behavioral modification strategies: increasing lean protein intake,  decreasing simple carbohydrates, increasing vegetables, increasing water intake, decreasing eating out, no skipping meals, meal planning and cooking strategies, keeping healthy foods in the home, and planning for  success.  Benjamin Shepard has agreed to follow-up with our clinic in 4 weeks. He was informed of the importance of frequent follow-up visits to maximize his success with intensive lifestyle modifications for his multiple health conditions.   Benjamin Shepard was informed we would discuss his lab results at his next visit unless there is a critical issue that needs to be addressed sooner. Benjamin Shepard agreed to keep his next visit at the agreed upon time to discuss these results.  Objective:   Pulse (!) 59, temperature 97.8 F (36.6 C), height '5\' 10"'$  (1.778 m), weight (!) 327 lb (148.3 kg), SpO2 97 %. Body mass index is 46.92 kg/m.  General: Cooperative, alert, well developed, in no acute distress. HEENT: Conjunctivae and lids unremarkable. Cardiovascular: Regular rhythm.  Lungs: Normal work of breathing. Neurologic: No focal deficits.   Lab Results  Component Value Date   CREATININE 0.91 10/17/2020   BUN 17 10/17/2020   NA 144 10/17/2020   K 4.3 10/17/2020   CL 103 10/17/2020   CO2 27 10/17/2020   Lab Results  Component Value Date   ALT 21 10/17/2020   AST 14 10/17/2020   ALKPHOS 91 10/17/2020   BILITOT 0.5 10/17/2020   Lab Results  Component Value Date   HGBA1C 5.6 10/17/2020   HGBA1C 5.6 03/13/2020   HGBA1C 6.2 (H) 09/15/2019   HGBA1C 5.9 (H) 05/22/2015   Lab Results  Component Value Date   INSULIN 17.8 10/17/2020   INSULIN 23.3 09/15/2019   Lab Results  Component Value Date   TSH 2.570 09/15/2019   Lab Results  Component Value Date   CHOL 102 10/17/2020   HDL 42 10/17/2020   LDLCALC 48 10/17/2020   TRIG 49 10/17/2020   Lab Results  Component Value Date   VD25OH 56.4 10/17/2020   VD25OH 55.6 03/13/2020   VD25OH 26.6 (L) 09/15/2019   Lab Results  Component Value Date   WBC 7.4 05/22/2015   HGB 11.3 (L) 06/01/2015   HCT 34.8 (L) 06/01/2015   MCV 93.3 05/22/2015   PLT 203 05/22/2015   No results found for: IRON, TIBC, FERRITIN  Obesity Behavioral Intervention:    Approximately 15 minutes were spent on the discussion below.  ASK: We discussed the diagnosis of obesity with Benjamin Shepard today and Benjamin Shepard agreed to give [place] permission to discuss obesity behavioral modification therapy today.  ASSESS: Benjamin Shepard has the diagnosis of obesity and his BMI today is 46.9. Benjamin Shepard is in the action stage of change.   ADVISE: Benjamin Shepard was educated on the multiple health risks of obesity as well as the benefit of weight loss to improve his health. He was advised of the need for long term treatment and the importance of lifestyle modifications to improve his current health and to decrease his risk of future health problems.  AGREE: Multiple dietary modification options and treatment options were discussed and Benjamin Shepard agreed to follow the recommendations documented in the above note.  ARRANGE: Benjamin Shepard was educated on the importance of frequent visits to treat obesity as outlined per CMS and USPSTF guidelines and agreed to schedule his next follow up appointment today.  Attestation Statements:   Reviewed by clinician on day of visit: allergies, medications, problem list, medical history, surgical history, family history, social history, and previous encounter notes.  I, Lizbeth Bark, RMA, am acting as Location manager  for [employer], DO.  I have reviewed the above documentation for accuracy and completeness, and I agree with the above. Jearld Lesch, DO

## 2020-10-25 ENCOUNTER — Encounter (INDEPENDENT_AMBULATORY_CARE_PROVIDER_SITE_OTHER): Payer: Self-pay | Admitting: Bariatrics

## 2020-11-03 DIAGNOSIS — Z20822 Contact with and (suspected) exposure to covid-19: Secondary | ICD-10-CM | POA: Diagnosis not present

## 2020-11-09 DIAGNOSIS — E291 Testicular hypofunction: Secondary | ICD-10-CM | POA: Diagnosis not present

## 2020-11-14 ENCOUNTER — Other Ambulatory Visit: Payer: Self-pay

## 2020-11-14 ENCOUNTER — Encounter (INDEPENDENT_AMBULATORY_CARE_PROVIDER_SITE_OTHER): Payer: Self-pay | Admitting: Bariatrics

## 2020-11-14 ENCOUNTER — Ambulatory Visit (INDEPENDENT_AMBULATORY_CARE_PROVIDER_SITE_OTHER): Payer: Medicare Other | Admitting: Bariatrics

## 2020-11-14 VITALS — BP 143/74 | HR 71 | Temp 97.9°F | Ht 70.0 in | Wt 325.0 lb

## 2020-11-14 DIAGNOSIS — E7849 Other hyperlipidemia: Secondary | ICD-10-CM | POA: Diagnosis not present

## 2020-11-14 DIAGNOSIS — Z6841 Body Mass Index (BMI) 40.0 and over, adult: Secondary | ICD-10-CM | POA: Diagnosis not present

## 2020-11-14 DIAGNOSIS — E1169 Type 2 diabetes mellitus with other specified complication: Secondary | ICD-10-CM

## 2020-11-15 ENCOUNTER — Ambulatory Visit (INDEPENDENT_AMBULATORY_CARE_PROVIDER_SITE_OTHER): Payer: Medicare Other | Admitting: Podiatry

## 2020-11-15 ENCOUNTER — Encounter: Payer: Self-pay | Admitting: Podiatry

## 2020-11-15 DIAGNOSIS — B351 Tinea unguium: Secondary | ICD-10-CM

## 2020-11-15 DIAGNOSIS — M79674 Pain in right toe(s): Secondary | ICD-10-CM

## 2020-11-15 DIAGNOSIS — M79675 Pain in left toe(s): Secondary | ICD-10-CM

## 2020-11-15 DIAGNOSIS — E119 Type 2 diabetes mellitus without complications: Secondary | ICD-10-CM

## 2020-11-15 NOTE — Progress Notes (Signed)
Chief Complaint:   OBESITY Gemayel is here to discuss his progress with his obesity treatment plan along with follow-up of his obesity related diagnoses. Irl is on the Category 4 Plan and states he is following his eating plan approximately 80% of the time. Kyian states he is doing 0 minutes 0 times per week.  Today's visit was #: 22 Starting weight: 342 lbs Starting date: 09/15/2019 Today's weight: 325 lbs Today's date: 11/14/2020 Total lbs lost to date: 17 lbs Total lbs lost since last in-office visit: 2 lbs  Interim History: Wetzel is down 2 lbs since his last visit. Also, he had Covid 19 in the interim  between his last visit and now.  Subjective:   1. Type 2 diabetes mellitus with other specified complication, without long-term current use of insulin (HCC) Conner is currently taking Metformin.  2. Other hyperlipidemia Chandlar is currently taking Lipitor.  Assessment/Plan:   1. Type 2 diabetes mellitus with other specified complication, without long-term current use of insulin (HCC) Zale will continue his medications. He will have continue to decrease carbohydrates. He will increase activities.Good blood sugar control is important to decrease the likelihood of diabetic complications such as nephropathy, neuropathy, limb loss, blindness, coronary artery disease, and death. Intensive lifestyle modification including diet, exercise and weight loss are the first line of treatment for diabetes.   2. Other hyperlipidemia Cardiovascular risk and specific lipid/LDL goals reviewed.  We discussed several lifestyle modifications today and Chavon will continue to work on diet, exercise and weight loss efforts. Tyress will continue his medications. He will have no trans fats and he will decrease saturated fats. Orders and follow up as documented in patient record.   Counseling Intensive lifestyle modifications are the first line treatment for this issue. Dietary changes: Increase  soluble fiber. Decrease simple carbohydrates. Exercise changes: Moderate to vigorous-intensity aerobic activity 150 minutes per week if tolerated. Lipid-lowering medications: see documented in medical record.   3. Obesity, current BMI 46.6 Burce is currently in the action stage of change. As such, his goal is to continue with weight loss efforts. He has agreed to the Category 4 Plan.   Anderw will stay well hydrated. He will increase protein.  Exercise goals:  Tykevious will increase activities strength bands.  Behavioral modification strategies: increasing lean protein intake, decreasing simple carbohydrates, increasing vegetables, increasing water intake, decreasing eating out, no skipping meals, meal planning and cooking strategies, keeping healthy foods in the home, and planning for success.  Aiven has agreed to follow-up with our clinic in 4 weeks. He was informed of the importance of frequent follow-up visits to maximize his success with intensive lifestyle modifications for his multiple health conditions.   Objective:   Blood pressure (!) 143/74, pulse 71, temperature 97.9 F (36.6 C), height '5\' 10"'$  (1.778 m), weight (!) 325 lb (147.4 kg), SpO2 95 %. Body mass index is 46.63 kg/m.  General: Cooperative, alert, well developed, in no acute distress. HEENT: Conjunctivae and lids unremarkable. Cardiovascular: Regular rhythm.  Lungs: Normal work of breathing. Neurologic: No focal deficits.   Lab Results  Component Value Date   CREATININE 0.91 10/17/2020   BUN 17 10/17/2020   NA 144 10/17/2020   K 4.3 10/17/2020   CL 103 10/17/2020   CO2 27 10/17/2020   Lab Results  Component Value Date   ALT 21 10/17/2020   AST 14 10/17/2020   ALKPHOS 91 10/17/2020   BILITOT 0.5 10/17/2020   Lab Results  Component Value Date  HGBA1C 5.6 10/17/2020   HGBA1C 5.6 03/13/2020   HGBA1C 6.2 (H) 09/15/2019   HGBA1C 5.9 (H) 05/22/2015   Lab Results  Component Value Date   INSULIN 17.8  10/17/2020   INSULIN 23.3 09/15/2019   Lab Results  Component Value Date   TSH 2.570 09/15/2019   Lab Results  Component Value Date   CHOL 102 10/17/2020   HDL 42 10/17/2020   LDLCALC 48 10/17/2020   TRIG 49 10/17/2020   Lab Results  Component Value Date   VD25OH 56.4 10/17/2020   VD25OH 55.6 03/13/2020   VD25OH 26.6 (L) 09/15/2019   Lab Results  Component Value Date   WBC 7.4 05/22/2015   HGB 11.3 (L) 06/01/2015   HCT 34.8 (L) 06/01/2015   MCV 93.3 05/22/2015   PLT 203 05/22/2015   No results found for: IRON, TIBC, FERRITIN  Obesity Behavioral Intervention:   Approximately 15 minutes were spent on the discussion below.  ASK: We discussed the diagnosis of obesity with [name] today and Kwadwo agreed to give [place] permission to discuss obesity behavioral modification therapy today.  ASSESS: Davarian has the diagnosis of obesity and his BMI today is 46.6. Tylan is in the action stage of change.   ADVISE: Olen was educated on the multiple health risks of obesity as well as the benefit of weight loss to improve his health. He was advised of the need for long term treatment and the importance of lifestyle modifications to improve his current health and to decrease his risk of future health problems.  AGREE: Multiple dietary modification options and treatment options were discussed and Jaquan agreed to follow the recommendations documented in the above note.  ARRANGE: Damiane was educated on the importance of frequent visits to treat obesity as outlined per CMS and USPSTF guidelines and agreed to schedule his next follow up appointment today.  Attestation Statements:   Reviewed by clinician on day of visit: allergies, medications, problem list, medical history, surgical history, family history, social history, and previous encounter notes.  I, Lizbeth Bark, RMA, am acting as Location manager for [employer], DO.   I have reviewed the above documentation for accuracy and  completeness, and I agree with the above. Jearld Lesch, DO

## 2020-11-15 NOTE — Progress Notes (Signed)
This patient returns to my office for at risk foot care.  This patient requires this care by a professional since this patient will be at risk due to having  diabetes.This patient is unable to cut nails themselves since the patient cannot reach their nails.These nails are painful walking and wearing shoes.  This patient presents for at risk foot care today.  General Appearance  Alert, conversant and in no acute stress.  Vascular  Dorsalis pedis  are palpable  bilaterally. Posterior tibial pulses are not palpable. Capillary return is within normal limits  bilaterally. Temperature is within normal limits  bilaterally.  Neurologic  Senn-Weinstein monofilament wire test within normal limits  bilaterally. Muscle power within normal limits bilaterally.  Nails Thick disfigured discolored nails with subungual debris  from hallux to fifth toes bilaterally. No evidence of bacterial infection or drainage bilaterally.  Orthopedic  No limitations of motion  feet .  No crepitus or effusions noted.  No bony pathology or digital deformities noted.  Mild  HAV  B/L.  Skin  normotropic skin with no porokeratosis noted bilaterally.  No signs of infections or ulcers noted.     Onychomycosis  Pain in right toe  Pain in left toe.  Consent was obtained for treatment procedures.  Debridement and grinding of long thick nails with clearing of subungual debris.  No infection or ulcer.     Return office visit  3 months        Told patient to return for periodic foot care and evaluation due to potential at risk complications.   Jolynda Townley DPM  

## 2020-11-16 ENCOUNTER — Encounter (INDEPENDENT_AMBULATORY_CARE_PROVIDER_SITE_OTHER): Payer: Self-pay | Admitting: Bariatrics

## 2020-11-20 ENCOUNTER — Other Ambulatory Visit (INDEPENDENT_AMBULATORY_CARE_PROVIDER_SITE_OTHER): Payer: Self-pay | Admitting: Bariatrics

## 2020-11-20 DIAGNOSIS — E559 Vitamin D deficiency, unspecified: Secondary | ICD-10-CM

## 2020-11-20 NOTE — Telephone Encounter (Signed)
Pt last seen by Dr. Brown.  

## 2020-11-20 NOTE — Telephone Encounter (Signed)
LAST APPOINTMENT DATE: 11/14/2020 NEXT APPOINTMENT DATE: 12/12/20   [facility] RK:[name] AT [place] [address] Phone: [phone] Fax: [phone]  Patient is requesting a refill of the following medications: Pending Prescriptions:                       Disp   Refills   Vitamin D, Ergocalciferol, (DRISDOL) 1.25 *4 caps*0       Sig: TAKE 1 CAPSULE BY MOUTH EVERY 7 DAYS   Date last filled: 10/17/20 Previously prescribed by Dr. Owens Shark  Lab Results      Component                Value               Date                      HGBA1C                   5.6                 10/17/2020                HGBA1C                   5.6                 03/13/2020                HGBA1C                   6.2 (H)             09/15/2019           Lab Results      Component                Value               Date                      LDLCALC                  48                  10/17/2020                CREATININE               0.91                10/17/2020           Lab Results      Component                Value               Date                      VD25OH                   56.4                10/17/2020                VD25OH  55.6                03/13/2020                VD25OH                   26.6 (L)            09/15/2019            BP Readings from Last 3 Encounters: 11/14/20 : (!) 143/74 10/17/20 : (!) 156/68 09/11/20 : (!) 145/70

## 2020-12-12 ENCOUNTER — Ambulatory Visit (INDEPENDENT_AMBULATORY_CARE_PROVIDER_SITE_OTHER): Payer: Medicare Other | Admitting: Bariatrics

## 2020-12-21 ENCOUNTER — Other Ambulatory Visit: Payer: Self-pay

## 2020-12-21 ENCOUNTER — Encounter (INDEPENDENT_AMBULATORY_CARE_PROVIDER_SITE_OTHER): Payer: Self-pay | Admitting: Bariatrics

## 2020-12-21 ENCOUNTER — Ambulatory Visit (INDEPENDENT_AMBULATORY_CARE_PROVIDER_SITE_OTHER): Payer: Medicare Other | Admitting: Bariatrics

## 2020-12-21 VITALS — BP 138/63 | HR 68 | Temp 97.5°F | Ht 70.0 in | Wt 325.0 lb

## 2020-12-21 DIAGNOSIS — Z6841 Body Mass Index (BMI) 40.0 and over, adult: Secondary | ICD-10-CM

## 2020-12-21 DIAGNOSIS — E1169 Type 2 diabetes mellitus with other specified complication: Secondary | ICD-10-CM

## 2020-12-21 DIAGNOSIS — E559 Vitamin D deficiency, unspecified: Secondary | ICD-10-CM | POA: Diagnosis not present

## 2020-12-21 MED ORDER — VITAMIN D (ERGOCALCIFEROL) 1.25 MG (50000 UNIT) PO CAPS: ORAL_CAPSULE | ORAL | 0 refills | Status: DC

## 2020-12-21 NOTE — Progress Notes (Signed)
Chief Complaint:   OBESITY Benjamin Shepard is here to discuss his progress with his obesity treatment plan along with follow-up of his obesity related diagnoses. Benjamin Shepard is on the Category 4 Plan and states he is following his eating plan approximately 50% of the time. Benjamin Shepard states he is doing 0 minutes 0 times per week.  Today's visit was #: 23 Starting weight: 342 lbs Starting date: 09/15/2019 Today's weight: 325 lbs Today's date: 12/21/2020 Total lbs lost to date: 17 lbs Total lbs lost since last in-office visit: 0  Interim History: Benjamin Shepard's weight remains the same since his last visit. He has been out of town for several weeks.   Subjective:   1. Vitamin D deficiency He is currently taking prescription vitamin D 50,000 IU each week. He denies nausea, vomiting or muscle weakness.  2. Type 2 diabetes mellitus with other specified complication, without long-term current use of insulin (HCC) Benjamin Shepard is Metformin currently. He denies appetite issues.   Assessment/Plan:   1. Vitamin D deficiency Low Vitamin D level contributes to fatigue and are associated with obesity, breast, and colon cancer. We will refill prescription Vitamin D 50,000 IU every week for 1 month with no refills and Benjamin Shepard will follow-up for routine testing of Vitamin D, at least 2-3 times per year to avoid over-replacement.  - Vitamin D, Ergocalciferol, (DRISDOL) 1.25 MG (50000 UNIT) CAPS capsule; TAKE 1 CAPSULE BY MOUTH EVERY 7 DAYS  Dispense: 4 capsule; Refill: 0  2. Type 2 diabetes mellitus with other specified complication, without long-term current use of insulin (HCC) Good blood sugar control is important to decrease the likelihood of diabetic complications such as nephropathy, neuropathy, limb loss, blindness, coronary artery disease, and death. Benjamin Shepard will continue medications. Intensive lifestyle modification including diet, exercise and weight loss are the first line of treatment for diabetes.    3. Obesity,  current BMI 46.6 Benjamin Shepard is currently in the action stage of change. As such, his goal is to continue with weight loss efforts. He has agreed to the Category 4 Plan.   Benjamin Shepard continue meal planning and intentional eating. He will get back on track. He will increase H2O.  Exercise goals:  Benjamin Shepard will be more active and home. He will begin using the bands.   Behavioral modification strategies: increasing lean protein intake, decreasing simple carbohydrates, increasing vegetables, increasing water intake, decreasing eating out, no skipping meals, meal planning and cooking strategies, keeping healthy foods in the home, and planning for success.  Benjamin Shepard has agreed to follow-up with our clinic in 4 weeks. He was informed of the importance of frequent follow-up visits to maximize his success with intensive lifestyle modifications for his multiple health conditions.   Objective:   Blood pressure 138/63, pulse 68, temperature (!) 97.5 F (36.4 C), height 5\' 10"  (1.778 m), weight (!) 325 lb (147.4 kg), SpO2 96 %. Body mass index is 46.63 kg/m.  General: Cooperative, alert, well developed, in no acute distress. HEENT: Conjunctivae and lids unremarkable. Cardiovascular: Regular rhythm.  Lungs: Normal work of breathing. Neurologic: No focal deficits.   Lab Results  Component Value Date   CREATININE 0.91 10/17/2020   BUN 17 10/17/2020   NA 144 10/17/2020   K 4.3 10/17/2020   CL 103 10/17/2020   CO2 27 10/17/2020   Lab Results  Component Value Date   ALT 21 10/17/2020   AST 14 10/17/2020   ALKPHOS 91 10/17/2020   BILITOT 0.5 10/17/2020   Lab Results  Component Value Date  HGBA1C 5.6 10/17/2020   HGBA1C 5.6 03/13/2020   HGBA1C 6.2 (H) 09/15/2019   HGBA1C 5.9 (H) 05/22/2015   Lab Results  Component Value Date   INSULIN 17.8 10/17/2020   INSULIN 23.3 09/15/2019   Lab Results  Component Value Date   TSH 2.570 09/15/2019   Lab Results  Component Value Date   CHOL 102 10/17/2020    HDL 42 10/17/2020   LDLCALC 48 10/17/2020   TRIG 49 10/17/2020   Lab Results  Component Value Date   VD25OH 56.4 10/17/2020   VD25OH 55.6 03/13/2020   VD25OH 26.6 (L) 09/15/2019   Lab Results  Component Value Date   WBC 7.4 05/22/2015   HGB 11.3 (L) 06/01/2015   HCT 34.8 (L) 06/01/2015   MCV 93.3 05/22/2015   PLT 203 05/22/2015   No results found for: IRON, TIBC, FERRITIN  Obesity Behavioral Intervention:   Approximately 15 minutes were spent on the discussion below.  ASK: We discussed the diagnosis of obesity with Benjamin Shepard today and Benjamin Shepard agreed to give [place] permission to discuss obesity behavioral modification therapy today.  ASSESS: Benjamin Shepard has the diagnosis of obesity and his BMI today is 46.6. Benjamin Shepard is in the action stage of change.   ADVISE: Benjamin Shepard was educated on the multiple health risks of obesity as well as the benefit of weight loss to improve his health. He was advised of the need for long term treatment and the importance of lifestyle modifications to improve his current health and to decrease his risk of future health problems.  AGREE: Multiple dietary modification options and treatment options were discussed and Benjamin Shepard agreed to follow the recommendations documented in the above note.  ARRANGE: Benjamin Shepard was educated on the importance of frequent visits to treat obesity as outlined per CMS and USPSTF guidelines and agreed to schedule his next follow up appointment today.  Attestation Statements:   Reviewed by clinician on day of visit: allergies, medications, problem list, medical history, surgical history, family history, social history, and previous encounter notes.  I, Lizbeth Bark, RMA, am acting as Location manager for [employer], DO.   I have reviewed the above documentation for accuracy and completeness, and I agree with the above. Jearld Lesch, DO

## 2020-12-22 ENCOUNTER — Encounter (INDEPENDENT_AMBULATORY_CARE_PROVIDER_SITE_OTHER): Payer: Self-pay | Admitting: Bariatrics

## 2020-12-25 DIAGNOSIS — E785 Hyperlipidemia, unspecified: Secondary | ICD-10-CM | POA: Diagnosis not present

## 2020-12-25 DIAGNOSIS — Z125 Encounter for screening for malignant neoplasm of prostate: Secondary | ICD-10-CM | POA: Diagnosis not present

## 2020-12-25 DIAGNOSIS — E291 Testicular hypofunction: Secondary | ICD-10-CM | POA: Diagnosis not present

## 2021-01-01 DIAGNOSIS — E291 Testicular hypofunction: Secondary | ICD-10-CM | POA: Diagnosis not present

## 2021-01-01 DIAGNOSIS — Z0000 Encounter for general adult medical examination without abnormal findings: Secondary | ICD-10-CM | POA: Diagnosis not present

## 2021-01-01 DIAGNOSIS — M109 Gout, unspecified: Secondary | ICD-10-CM | POA: Diagnosis not present

## 2021-01-01 DIAGNOSIS — M25561 Pain in right knee: Secondary | ICD-10-CM | POA: Diagnosis not present

## 2021-01-01 DIAGNOSIS — R7303 Prediabetes: Secondary | ICD-10-CM | POA: Diagnosis not present

## 2021-01-01 DIAGNOSIS — Z1339 Encounter for screening examination for other mental health and behavioral disorders: Secondary | ICD-10-CM | POA: Diagnosis not present

## 2021-01-01 DIAGNOSIS — N401 Enlarged prostate with lower urinary tract symptoms: Secondary | ICD-10-CM | POA: Diagnosis not present

## 2021-01-01 DIAGNOSIS — Z23 Encounter for immunization: Secondary | ICD-10-CM | POA: Diagnosis not present

## 2021-01-01 DIAGNOSIS — I10 Essential (primary) hypertension: Secondary | ICD-10-CM | POA: Diagnosis not present

## 2021-01-01 DIAGNOSIS — E785 Hyperlipidemia, unspecified: Secondary | ICD-10-CM | POA: Diagnosis not present

## 2021-01-01 DIAGNOSIS — R609 Edema, unspecified: Secondary | ICD-10-CM | POA: Diagnosis not present

## 2021-01-01 DIAGNOSIS — Z1331 Encounter for screening for depression: Secondary | ICD-10-CM | POA: Diagnosis not present

## 2021-01-01 DIAGNOSIS — R972 Elevated prostate specific antigen [PSA]: Secondary | ICD-10-CM | POA: Diagnosis not present

## 2021-01-01 DIAGNOSIS — G4733 Obstructive sleep apnea (adult) (pediatric): Secondary | ICD-10-CM | POA: Diagnosis not present

## 2021-01-09 DIAGNOSIS — Z23 Encounter for immunization: Secondary | ICD-10-CM | POA: Diagnosis not present

## 2021-01-18 ENCOUNTER — Ambulatory Visit (INDEPENDENT_AMBULATORY_CARE_PROVIDER_SITE_OTHER): Payer: Medicare Other | Admitting: Bariatrics

## 2021-01-18 ENCOUNTER — Other Ambulatory Visit: Payer: Self-pay

## 2021-01-18 ENCOUNTER — Encounter (INDEPENDENT_AMBULATORY_CARE_PROVIDER_SITE_OTHER): Payer: Self-pay | Admitting: Bariatrics

## 2021-01-18 VITALS — BP 128/67 | HR 62 | Temp 97.6°F | Ht 70.0 in | Wt 327.0 lb

## 2021-01-18 DIAGNOSIS — E559 Vitamin D deficiency, unspecified: Secondary | ICD-10-CM | POA: Diagnosis not present

## 2021-01-18 DIAGNOSIS — Z6841 Body Mass Index (BMI) 40.0 and over, adult: Secondary | ICD-10-CM | POA: Diagnosis not present

## 2021-01-18 DIAGNOSIS — E1169 Type 2 diabetes mellitus with other specified complication: Secondary | ICD-10-CM

## 2021-01-18 MED ORDER — VITAMIN D (ERGOCALCIFEROL) 1.25 MG (50000 UNIT) PO CAPS: ORAL_CAPSULE | ORAL | 0 refills | Status: DC

## 2021-01-18 NOTE — Progress Notes (Signed)
Chief Complaint:   OBESITY Benjamin Shepard is here to discuss his progress with his obesity treatment plan along with follow-up of his obesity related diagnoses. Benjamin Shepard is on the Category 4 Plan and states he is following his eating plan approximately 80% of the time. Benjamin Shepard states he is using stretching bands for 15 minutes 3-4 times per week.  Today's visit was #: 24 Starting weight: 342 lbs Starting date: 09/15/2019 Today's weight: 327 lbs Today's date: 01/18/2021 Total lbs lost to date: 15 lbs Total lbs lost since last in-office visit: 0  Interim History: Benjamin Shepard is up 2 lbs since his las visit.   Subjective:   1. Vitamin D deficiency Benjamin Shepard is taking Vitamin D currently.  2. Type 2 diabetes mellitus with other specified complication, without long-term current use of insulin (HCC) Benjamin Shepard is currently taking Glucophage. His last A1C level was 5.7.  Assessment/Plan:   1. Vitamin D deficiency Low Vitamin D level contributes to fatigue and are associated with obesity, breast, and colon cancer. We will refill prescription Vitamin D 50,000 IU every week for 1 month with no refills and Benjamin Shepard will follow-up for routine testing of Vitamin D, at least 2-3 times per year to avoid over-replacement.  - Vitamin D, Ergocalciferol, (DRISDOL) 1.25 MG (50000 UNIT) CAPS capsule; TAKE 1 CAPSULE BY MOUTH EVERY 7 DAYS  Dispense: 4 capsule; Refill: 0  2. Type 2 diabetes mellitus with other specified complication, without long-term current use of insulin (HCC) Benjamin Shepard will continue medications. Good blood sugar control is important to decrease the likelihood of diabetic complications such as nephropathy, neuropathy, limb loss, blindness, coronary artery disease, and death. Intensive lifestyle modification including diet, exercise and weight loss are the first line of treatment for diabetes.    3. Obesity, current BMI 46.9 Benjamin Shepard is currently in the action stage of change. As such, his goal is to continue  with weight loss efforts. He has agreed to the Category 4 Plan.   Benjamin Shepard will continue meal planning. He will adhere closely to the plan at 90%. I reviewed labs with Benjamin Shepard. He will measure his meat and protein.  Exercise goals:  As is.  Behavioral modification strategies: increasing lean protein intake, decreasing simple carbohydrates, increasing vegetables, increasing water intake, decreasing eating out, no skipping meals, meal planning and cooking strategies, keeping healthy foods in the home, and planning for success.  Benjamin Shepard has agreed to follow-up with our clinic in 4 weeks. He was informed of the importance of frequent follow-up visits to maximize his success with intensive lifestyle modifications for his multiple health conditions.   Objective:   Blood pressure 128/67, pulse 62, temperature 97.6 F (36.4 C), height 5\' 10"  (1.778 m), weight (!) 327 lb (148.3 kg), SpO2 97 %. Body mass index is 46.92 kg/m.  General: Cooperative, alert, well developed, in no acute distress. HEENT: Conjunctivae and lids unremarkable. Cardiovascular: Regular rhythm.  Lungs: Normal work of breathing. Neurologic: No focal deficits.   Lab Results  Component Value Date   CREATININE 0.91 10/17/2020   BUN 17 10/17/2020   NA 144 10/17/2020   K 4.3 10/17/2020   CL 103 10/17/2020   CO2 27 10/17/2020   Lab Results  Component Value Date   ALT 21 10/17/2020   AST 14 10/17/2020   ALKPHOS 91 10/17/2020   BILITOT 0.5 10/17/2020   Lab Results  Component Value Date   HGBA1C 5.6 10/17/2020   HGBA1C 5.6 03/13/2020   HGBA1C 6.2 (H) 09/15/2019   HGBA1C 5.9 (  H) 05/22/2015   Lab Results  Component Value Date   INSULIN 17.8 10/17/2020   INSULIN 23.3 09/15/2019   Lab Results  Component Value Date   TSH 2.570 09/15/2019   Lab Results  Component Value Date   CHOL 102 10/17/2020   HDL 42 10/17/2020   LDLCALC 48 10/17/2020   TRIG 49 10/17/2020   Lab Results  Component Value Date   VD25OH 56.4  10/17/2020   VD25OH 55.6 03/13/2020   VD25OH 26.6 (L) 09/15/2019   Lab Results  Component Value Date   WBC 7.4 05/22/2015   HGB 11.3 (L) 06/01/2015   HCT 34.8 (L) 06/01/2015   MCV 93.3 05/22/2015   PLT 203 05/22/2015   No results found for: IRON, TIBC, FERRITIN  Obesity Behavioral Intervention:   Approximately 15 minutes were spent on the discussion below.  ASK: We discussed the diagnosis of obesity with Benjamin Shepard today and Benjamin Shepard agreed to give [place] permission to discuss obesity behavioral modification therapy today.  ASSESS: Benjamin Shepard has the diagnosis of obesity and his BMI today is 46.9. Benjamin Shepard is in the action stage of change.   ADVISE: Benjamin Shepard was educated on the multiple health risks of obesity as well as the benefit of weight loss to improve his health. He was advised of the need for long term treatment and the importance of lifestyle modifications to improve his current health and to decrease his risk of future health problems.  AGREE: Multiple dietary modification options and treatment options were discussed and Benjamin Shepard agreed to follow the recommendations documented in the above note.  ARRANGE: Benjamin Shepard was educated on the importance of frequent visits to treat obesity as outlined per CMS and USPSTF guidelines and agreed to schedule his next follow up appointment today.  Attestation Statements:   Reviewed by clinician on day of visit: allergies, medications, problem list, medical history, surgical history, family history, social history, and previous encounter notes.   I, Lizbeth Bark, RMA, am acting as Location manager for [employer], DO.   I have reviewed the above documentation for accuracy and completeness, and I agree with the above. Jearld Lesch, DO

## 2021-01-22 ENCOUNTER — Encounter (INDEPENDENT_AMBULATORY_CARE_PROVIDER_SITE_OTHER): Payer: Self-pay | Admitting: Bariatrics

## 2021-02-15 ENCOUNTER — Ambulatory Visit (INDEPENDENT_AMBULATORY_CARE_PROVIDER_SITE_OTHER): Payer: Medicare Other | Admitting: Bariatrics

## 2021-02-15 ENCOUNTER — Other Ambulatory Visit: Payer: Self-pay

## 2021-02-15 ENCOUNTER — Encounter (INDEPENDENT_AMBULATORY_CARE_PROVIDER_SITE_OTHER): Payer: Self-pay | Admitting: Bariatrics

## 2021-02-15 VITALS — BP 129/67 | HR 73 | Temp 98.0°F | Ht 70.0 in | Wt 330.0 lb

## 2021-02-15 DIAGNOSIS — Z6841 Body Mass Index (BMI) 40.0 and over, adult: Secondary | ICD-10-CM | POA: Diagnosis not present

## 2021-02-15 DIAGNOSIS — E1169 Type 2 diabetes mellitus with other specified complication: Secondary | ICD-10-CM

## 2021-02-15 DIAGNOSIS — I10 Essential (primary) hypertension: Secondary | ICD-10-CM | POA: Diagnosis not present

## 2021-02-15 NOTE — Progress Notes (Signed)
Chief Complaint:   OBESITY Benjamin Shepard is here to discuss his progress with his obesity treatment plan along with follow-up of his obesity related diagnoses. Benjamin Shepard is on the Category 4 Plan and states he is following his eating plan approximately 70% of the time. Benjamin Shepard states he is using stretching bands for 20 minutes 2-3 times per week.  Today's visit was #: 25 Starting weight: 342 lbs Starting date: 09/15/2019 Today's weight: 330 lbs Today's date: 02/15/2021 Total lbs lost to date: 12 lbs Total lbs lost since last in-office visit: 0  Interim History: Benjamin Shepard is up 3 lbs since his last visit. He states that he ate more than he should have.  Subjective:   1. Type 2 diabetes mellitus with other specified complication, without long-term current use of insulin (HCC) Benjamin Shepard is currently taking Metformin. He notes increased appetite. He denies side effects.   2. Essential hypertension Chue is currently taking Cardura, HCTZ, Cozaar and Tiazac. His blood pressure is controlled. His last blood pressure was 129/67.  Assessment/Plan:   1. Type 2 diabetes mellitus with other specified complication, without long-term current use of insulin (HCC) Benjamin Shepard will continue medications. He will use our recipes. Good blood sugar control is important to decrease the likelihood of diabetic complications such as nephropathy, neuropathy, limb loss, blindness, coronary artery disease, and death. Intensive lifestyle modification including diet, exercise and weight loss are the first line of treatment for diabetes.   2. Essential hypertension Benjamin Shepard will continue his medications. He is working on healthy weight loss and exercise to improve blood pressure control. We will watch for signs of hypotension as he continues his lifestyle modifications.  3. Obesity, current BMI 47.5 Benjamin Shepard is currently in the action stage of change. As such, his goal is to continue with weight loss efforts. He has agreed to the  Category 4 Plan.   Benjamin Shepard will continue meal planning. He will keep his water and protein high.  Exercise goals:  Benjamin Shepard will continue using stretching bands. He will get out more.  Behavioral modification strategies: increasing lean protein intake, decreasing simple carbohydrates, increasing vegetables, increasing water intake, decreasing eating out, no skipping meals, meal planning and cooking strategies, keeping healthy foods in the home, and planning for success.  Benjamin Shepard has agreed to follow-up with our clinic in 5 weeks. He was informed of the importance of frequent follow-up visits to maximize his success with intensive lifestyle modifications for his multiple health conditions.   Objective:   Blood pressure 129/67, pulse 73, temperature 98 F (36.7 C), height 5\' 10"  (1.778 m), weight (!) 330 lb (149.7 kg), SpO2 95 %. Body mass index is 47.35 kg/m.  General: Cooperative, alert, well developed, in no acute distress. HEENT: Conjunctivae and lids unremarkable. Cardiovascular: Regular rhythm.  Lungs: Normal work of breathing. Neurologic: No focal deficits.   Lab Results  Component Value Date   CREATININE 0.91 10/17/2020   BUN 17 10/17/2020   NA 144 10/17/2020   K 4.3 10/17/2020   CL 103 10/17/2020   CO2 27 10/17/2020   Lab Results  Component Value Date   ALT 21 10/17/2020   AST 14 10/17/2020   ALKPHOS 91 10/17/2020   BILITOT 0.5 10/17/2020   Lab Results  Component Value Date   HGBA1C 5.6 10/17/2020   HGBA1C 5.6 03/13/2020   HGBA1C 6.2 (H) 09/15/2019   HGBA1C 5.9 (H) 05/22/2015   Lab Results  Component Value Date   INSULIN 17.8 10/17/2020   INSULIN 23.3 09/15/2019  Lab Results  Component Value Date   TSH 2.570 09/15/2019   Lab Results  Component Value Date   CHOL 102 10/17/2020   HDL 42 10/17/2020   LDLCALC 48 10/17/2020   TRIG 49 10/17/2020   Lab Results  Component Value Date   VD25OH 56.4 10/17/2020   VD25OH 55.6 03/13/2020   VD25OH 26.6 (L)  09/15/2019   Lab Results  Component Value Date   WBC 7.4 05/22/2015   HGB 11.3 (L) 06/01/2015   HCT 34.8 (L) 06/01/2015   MCV 93.3 05/22/2015   PLT 203 05/22/2015   No results found for: IRON, TIBC, FERRITIN  Obesity Behavioral Intervention:   Approximately 15 minutes were spent on the discussion below.  ASK: We discussed the diagnosis of obesity with Benjamin Shepard today and Benjamin Shepard agreed to give [place] permission to discuss obesity behavioral modification therapy today.  ASSESS: [name] has the diagnosis of obesity and his BMI today is 47.5. [name] is in the action stage of change.   ADVISE: Benjamin Shepard was educated on the multiple health risks of obesity as well as the benefit of weight loss to improve his health. He was advised of the need for long term treatment and the importance of lifestyle modifications to improve his current health and to decrease his risk of future health problems.  AGREE: Multiple dietary modification options and treatment options were discussed and Benjamin Shepard agreed to follow the recommendations documented in the above note.  ARRANGE: Benjamin Shepard was educated on the importance of frequent visits to treat obesity as outlined per CMS and USPSTF guidelines and agreed to schedule his next follow up appointment today.  Attestation Statements:   Reviewed by clinician on day of visit: allergies, medications, problem list, medical history, surgical history, family history, social history, and previous encounter notes.  I, Benjamin Shepard, RMA, am acting as Location manager for [employer], DO.   I have reviewed the above documentation for accuracy and completeness, and I agree with the above. Benjamin Lesch, DO

## 2021-02-21 ENCOUNTER — Ambulatory Visit (INDEPENDENT_AMBULATORY_CARE_PROVIDER_SITE_OTHER): Payer: Medicare Other | Admitting: Podiatry

## 2021-02-21 ENCOUNTER — Other Ambulatory Visit: Payer: Self-pay

## 2021-02-21 DIAGNOSIS — M79675 Pain in left toe(s): Secondary | ICD-10-CM

## 2021-02-21 DIAGNOSIS — B351 Tinea unguium: Secondary | ICD-10-CM

## 2021-02-21 DIAGNOSIS — M79674 Pain in right toe(s): Secondary | ICD-10-CM

## 2021-02-21 DIAGNOSIS — E119 Type 2 diabetes mellitus without complications: Secondary | ICD-10-CM

## 2021-02-21 NOTE — Progress Notes (Signed)
This patient returns to my office for at risk foot care.  This patient requires this care by a professional since this patient will be at risk due to having  diabetes.This patient is unable to cut nails themselves since the patient cannot reach their nails.These nails are painful walking and wearing shoes.  This patient presents for at risk foot care today.  General Appearance  Alert, conversant and in no acute stress.  Vascular  Dorsalis pedis  are palpable  bilaterally. Posterior tibial pulses are not palpable. Capillary return is within normal limits  bilaterally. Temperature is within normal limits  bilaterally.  Neurologic  Senn-Weinstein monofilament wire test within normal limits  bilaterally. Muscle power within normal limits bilaterally.  Nails Thick disfigured discolored nails with subungual debris  from hallux to fifth toes bilaterally. No evidence of bacterial infection or drainage bilaterally.  Orthopedic  No limitations of motion  feet .  No crepitus or effusions noted.  No bony pathology or digital deformities noted.  Mild  HAV  B/L.  Skin  normotropic skin with no porokeratosis noted bilaterally.  No signs of infections or ulcers noted.     Onychomycosis  Pain in right toe  Pain in left toe.  Consent was obtained for treatment procedures.  Debridement and grinding of long thick nails with clearing of subungual debris.  No infection or ulcer.     Return office visit  3 months        Told patient to return for periodic foot care and evaluation due to potential at risk complications.   Marcellino Fidalgo DPM  

## 2021-02-23 ENCOUNTER — Other Ambulatory Visit (INDEPENDENT_AMBULATORY_CARE_PROVIDER_SITE_OTHER): Payer: Self-pay | Admitting: Bariatrics

## 2021-02-23 DIAGNOSIS — E559 Vitamin D deficiency, unspecified: Secondary | ICD-10-CM

## 2021-02-26 NOTE — Telephone Encounter (Signed)
Pt last seen by Dr. Brown.  

## 2021-02-26 NOTE — Telephone Encounter (Signed)
LAST APPOINTMENT DATE: 02/15/21 NEXT APPOINTMENT DATE: 03/20/21   [facility] [place], [address] AT [address] [place] [address] Phone: [phone] Fax: [phone]  Patient is requesting a refill of the following medications: Pending Prescriptions:                       Disp   Refills   Vitamin D, Ergocalciferol, (DRISDOL) 1.25 *4 caps*0       Sig: TAKE 1 CAPSULE BY MOUTH EVERY 7 DAYS   Date last filled: 01/18/21 Previously prescribed by Dr. Owens Shark  Lab Results      Component                Value               Date                      HGBA1C                   5.6                 10/17/2020                HGBA1C                   5.6                 03/13/2020                HGBA1C                   6.2 (H)             09/15/2019           Lab Results      Component                Value               Date                      LDLCALC                  48                  10/17/2020                CREATININE               0.91                10/17/2020           Lab Results      Component                Value               Date                      VD25OH                   56.4                10/17/2020                VD25OH  55.6                03/13/2020                VD25OH                   26.6 (L)            09/15/2019            BP Readings from Last 3 Encounters: 02/15/21 : 129/67 01/18/21 : 128/67 12/21/20 : 138/63

## 2021-03-01 DIAGNOSIS — E291 Testicular hypofunction: Secondary | ICD-10-CM | POA: Diagnosis not present

## 2021-03-20 ENCOUNTER — Ambulatory Visit (INDEPENDENT_AMBULATORY_CARE_PROVIDER_SITE_OTHER): Payer: Medicare Other | Admitting: Bariatrics

## 2021-03-20 ENCOUNTER — Encounter (INDEPENDENT_AMBULATORY_CARE_PROVIDER_SITE_OTHER): Payer: Self-pay | Admitting: Bariatrics

## 2021-03-20 ENCOUNTER — Other Ambulatory Visit: Payer: Self-pay

## 2021-03-20 VITALS — BP 125/62 | HR 72 | Temp 97.6°F | Ht 70.0 in | Wt 326.0 lb

## 2021-03-20 DIAGNOSIS — E559 Vitamin D deficiency, unspecified: Secondary | ICD-10-CM

## 2021-03-20 DIAGNOSIS — I10 Essential (primary) hypertension: Secondary | ICD-10-CM | POA: Diagnosis not present

## 2021-03-20 DIAGNOSIS — Z6841 Body Mass Index (BMI) 40.0 and over, adult: Secondary | ICD-10-CM

## 2021-03-20 MED ORDER — VITAMIN D (ERGOCALCIFEROL) 1.25 MG (50000 UNIT) PO CAPS: 50000.0000 [IU] | ORAL_CAPSULE | ORAL | 0 refills | Status: DC

## 2021-03-20 NOTE — Progress Notes (Signed)
Chief Complaint:   OBESITY Benjamin Shepard is here to discuss his progress with his obesity treatment plan along with follow-up of his obesity related diagnoses. Benjamin Shepard is on the Category 4 Plan and states he is following his eating plan approximately 70% of the time. Benjamin Shepard states he is doing resistance band for 15 minutes 3-4 times per week.  Today's visit was #: 54 Starting weight: 342 lbs Starting date: 09/15/2019 Today's weight: 326 lbs Today's date: 03/20/2021 Total lbs lost to date: 16 lbs Total lbs lost since last in-office visit: 4 lbs  Interim History: Benjamin Shepard is down an additional 4 lbs since his last visit. He is exercising more and he is not as hungry. He is doing well with his water.  Subjective:   1. Essential hypertension Benjamin Shepard's blood pressure is controlled.   2. Vitamin D deficiency Sleeping okay and energy level fairly high. Mekiah is taking Vitamin D as directed.  Assessment/Plan:   1. Essential hypertension [name] will continue taking his medications. He is working on healthy weight loss and exercise to improve blood pressure control. We will watch for signs of hypotension as he continues his lifestyle modifications.  2. Vitamin D deficiency Low Vitamin D level contributes to fatigue and are associated with obesity, breast, and colon cancer. We will refill prescription Vitamin D 50,000 IU every week for 1 month with no refills and Benjamin Shepard will follow-up for routine testing of Vitamin D, at least 2-3 times per year to avoid over-replacement.  - Vitamin D, Ergocalciferol, (DRISDOL) 1.25 MG (50000 UNIT) CAPS capsule; Take 1 capsule (50,000 Units total) by mouth every 7 (seven) days.  Dispense: 4 capsule; Refill: 0  3. Obesity with current BMI of 46.9 Benjamin Shepard is currently in the action stage of change. As such, his goal is to continue with weight loss efforts. He has agreed to the Category 4 Plan.   Phil will continue meal planning and he will continue intentional  eating. He will keep protein high.  Exercise goals:  As is.  Behavioral modification strategies: increasing lean protein intake, decreasing simple carbohydrates, increasing vegetables, increasing water intake, decreasing eating out, no skipping meals, meal planning and cooking strategies, keeping healthy foods in the home, and planning for success.  Benjamin Shepard has agreed to follow-up with our clinic in 4 weeks. He was informed of the importance of frequent follow-up visits to maximize his success with intensive lifestyle modifications for his multiple health conditions.   Objective:   Blood pressure 125/62, pulse 72, temperature 97.6 F (36.4 C), height 5\' 10"  (1.778 m), weight (!) 326 lb (147.9 kg), SpO2 96 %. Body mass index is 46.78 kg/m.  General: Cooperative, alert, well developed, in no acute distress. HEENT: Conjunctivae and lids unremarkable. Cardiovascular: Regular rhythm.  Lungs: Normal work of breathing. Neurologic: No focal deficits.   Lab Results  Component Value Date   CREATININE 0.91 10/17/2020   BUN 17 10/17/2020   NA 144 10/17/2020   K 4.3 10/17/2020   CL 103 10/17/2020   CO2 27 10/17/2020   Lab Results  Component Value Date   ALT 21 10/17/2020   AST 14 10/17/2020   ALKPHOS 91 10/17/2020   BILITOT 0.5 10/17/2020   Lab Results  Component Value Date   HGBA1C 5.6 10/17/2020   HGBA1C 5.6 03/13/2020   HGBA1C 6.2 (H) 09/15/2019   HGBA1C 5.9 (H) 05/22/2015   Lab Results  Component Value Date   INSULIN 17.8 10/17/2020   INSULIN 23.3 09/15/2019   Lab  Results  Component Value Date   TSH 2.570 09/15/2019   Lab Results  Component Value Date   CHOL 102 10/17/2020   HDL 42 10/17/2020   LDLCALC 48 10/17/2020   TRIG 49 10/17/2020   Lab Results  Component Value Date   VD25OH 56.4 10/17/2020   VD25OH 55.6 03/13/2020   VD25OH 26.6 (L) 09/15/2019   Lab Results  Component Value Date   WBC 7.4 05/22/2015   HGB 11.3 (L) 06/01/2015   HCT 34.8 (L) 06/01/2015    MCV 93.3 05/22/2015   PLT 203 05/22/2015   No results found for: IRON, TIBC, FERRITIN  Attestation Statements:   Reviewed by clinician on day of visit: allergies, medications, problem list, medical history, surgical history, family history, social history, and previous encounter notes.  I, Lizbeth Bark, RMA, am acting as Location manager for [employer], DO.  I have reviewed the above documentation for accuracy and completeness, and I agree with the above. Jearld Lesch, DO

## 2021-03-22 DIAGNOSIS — E291 Testicular hypofunction: Secondary | ICD-10-CM | POA: Diagnosis not present

## 2021-04-12 DIAGNOSIS — E291 Testicular hypofunction: Secondary | ICD-10-CM | POA: Diagnosis not present

## 2021-04-17 ENCOUNTER — Ambulatory Visit (INDEPENDENT_AMBULATORY_CARE_PROVIDER_SITE_OTHER): Payer: Medicare Other | Admitting: Bariatrics

## 2021-04-17 ENCOUNTER — Other Ambulatory Visit: Payer: Self-pay

## 2021-04-17 ENCOUNTER — Encounter (INDEPENDENT_AMBULATORY_CARE_PROVIDER_SITE_OTHER): Payer: Self-pay | Admitting: Bariatrics

## 2021-04-17 VITALS — BP 146/64 | HR 68 | Temp 97.6°F | Ht 70.0 in | Wt 334.0 lb

## 2021-04-17 DIAGNOSIS — E66813 Obesity, class 3: Secondary | ICD-10-CM

## 2021-04-17 DIAGNOSIS — E7849 Other hyperlipidemia: Secondary | ICD-10-CM

## 2021-04-17 DIAGNOSIS — Z6841 Body Mass Index (BMI) 40.0 and over, adult: Secondary | ICD-10-CM

## 2021-04-17 DIAGNOSIS — I10 Essential (primary) hypertension: Secondary | ICD-10-CM

## 2021-04-17 DIAGNOSIS — E559 Vitamin D deficiency, unspecified: Secondary | ICD-10-CM

## 2021-04-17 NOTE — Progress Notes (Signed)
Chief Complaint:   OBESITY Benjamin Shepard is here to discuss his progress with his obesity treatment plan along with follow-up of his obesity related diagnoses. Benjamin Shepard is on the Category 4 Plan and states he is following his eating plan approximately 70% of the time. Benjamin Shepard states he is doing 0 minutes 0 times per week.  Today's visit was #: 74 Starting weight: 342 lbs Starting date: 09/15/2019 Today's weight: 334 lbs Today's date: 04/17/2021 Total lbs lost to date: 8 lbs Total lbs lost since last in-office visit: 0  Interim History: Benjamin Shepard is up 8 lbs since his last visit. He ate more over the holidays. He states that he is getting adequate water.   Subjective:   1. Essential hypertension Benjamin Shepard is taking Tiazac, Cardura, HCTZ and Cozaar currently. His blood pressure is reasonably well controlled. His blood pressure was 146/64 today.  2. Other hyperlipidemia Benjamin Shepard is currently taking Lipitor.   3. Vitamin D deficiency Benjamin Shepard is taking Vitamin D currently.   Assessment/Plan:   1. Essential hypertension Benjamin Shepard will continue his medications. He will have no added salt. He is working on healthy weight loss and exercise to improve blood pressure control. We will watch for signs of hypotension as he continues his lifestyle modifications.  2. Other hyperlipidemia Cardiovascular risk and specific lipid/LDL goals reviewed.  Benjamin Shepard will continue his medications. We discussed several lifestyle modifications today and Benjamin Shepard will continue to work on diet, exercise and weight loss efforts. Orders and follow up as documented in patient record.   Counseling Intensive lifestyle modifications are the first line treatment for this issue. Dietary changes: Increase soluble fiber. Decrease simple carbohydrates. Exercise changes: Moderate to vigorous-intensity aerobic activity 150 minutes per week if tolerated. Lipid-lowering medications: see documented in medical record.  3. Vitamin D  deficiency Low Vitamin D level contributes to fatigue and are associated with obesity, breast, and colon cancer. Benjamin Shepard agrees to continue taking prescription Vitamin D and he will follow-up for routine testing of Vitamin D, at least 2-3 times per year to avoid over-replacement.  4. Obesity with current BMI of 48.0 Benjamin Shepard is currently in the action stage of change. As such, his goal is to continue with weight loss efforts. He has agreed to keeping a food journal and adhering to recommended goals of 1800 calories and 100 grams of protein.   Benjamin Shepard will stay closer to the plan 80-90%. He will start to journal his calories and protein for every meal.  Exercise goals: No exercise has been prescribed at this time.  Behavioral modification strategies: increasing lean protein intake, decreasing simple carbohydrates, increasing vegetables, increasing water intake, decreasing eating out, no skipping meals, meal planning and cooking strategies, keeping healthy foods in the home, and planning for success.  Benjamin Shepard has agreed to follow-up with our clinic in 4 weeks. He was informed of the importance of frequent follow-up visits to maximize his success with intensive lifestyle modifications for his multiple health conditions.   Objective:   Blood pressure (!) 146/64, pulse 68, temperature 97.6 F (36.4 C), height 5\' 10"  (1.778 m), weight (!) 334 lb (151.5 kg), SpO2 96 %. Body mass index is 47.92 kg/m.  General: Cooperative, alert, well developed, in no acute distress. HEENT: Conjunctivae and lids unremarkable. Cardiovascular: Regular rhythm.  Lungs: Normal work of breathing. Neurologic: No focal deficits.   Lab Results  Component Value Date   CREATININE 0.91 10/17/2020   BUN 17 10/17/2020   NA 144 10/17/2020   K 4.3 10/17/2020  CL 103 10/17/2020   CO2 27 10/17/2020   Lab Results  Component Value Date   ALT 21 10/17/2020   AST 14 10/17/2020   ALKPHOS 91 10/17/2020   BILITOT 0.5 10/17/2020    Lab Results  Component Value Date   HGBA1C 5.6 10/17/2020   HGBA1C 5.6 03/13/2020   HGBA1C 6.2 (H) 09/15/2019   HGBA1C 5.9 (H) 05/22/2015   Lab Results  Component Value Date   INSULIN 17.8 10/17/2020   INSULIN 23.3 09/15/2019   Lab Results  Component Value Date   TSH 2.570 09/15/2019   Lab Results  Component Value Date   CHOL 102 10/17/2020   HDL 42 10/17/2020   LDLCALC 48 10/17/2020   TRIG 49 10/17/2020   Lab Results  Component Value Date   VD25OH 56.4 10/17/2020   VD25OH 55.6 03/13/2020   VD25OH 26.6 (L) 09/15/2019   Lab Results  Component Value Date   WBC 7.4 05/22/2015   HGB 11.3 (L) 06/01/2015   HCT 34.8 (L) 06/01/2015   MCV 93.3 05/22/2015   PLT 203 05/22/2015   No results found for: IRON, TIBC, FERRITIN  Attestation Statements:   Reviewed by clinician on day of visit: allergies, medications, problem list, medical history, surgical history, family history, social history, and previous encounter notes.  I, Lizbeth Bark, RMA, am acting as Location manager for [employer], DO.  I have reviewed the above documentation for accuracy and completeness, and I agree with the above. Jearld Lesch, DO

## 2021-04-18 ENCOUNTER — Encounter (INDEPENDENT_AMBULATORY_CARE_PROVIDER_SITE_OTHER): Payer: Self-pay | Admitting: Bariatrics

## 2021-05-03 DIAGNOSIS — E291 Testicular hypofunction: Secondary | ICD-10-CM | POA: Diagnosis not present

## 2021-05-10 DIAGNOSIS — E291 Testicular hypofunction: Secondary | ICD-10-CM | POA: Diagnosis not present

## 2021-05-15 ENCOUNTER — Other Ambulatory Visit: Payer: Self-pay

## 2021-05-15 ENCOUNTER — Encounter (INDEPENDENT_AMBULATORY_CARE_PROVIDER_SITE_OTHER): Payer: Self-pay | Admitting: Bariatrics

## 2021-05-15 ENCOUNTER — Ambulatory Visit (INDEPENDENT_AMBULATORY_CARE_PROVIDER_SITE_OTHER): Payer: Medicare Other | Admitting: Bariatrics

## 2021-05-15 VITALS — BP 146/72 | HR 76 | Temp 97.7°F | Ht 70.0 in | Wt 328.0 lb

## 2021-05-15 DIAGNOSIS — E1169 Type 2 diabetes mellitus with other specified complication: Secondary | ICD-10-CM

## 2021-05-15 DIAGNOSIS — I10 Essential (primary) hypertension: Secondary | ICD-10-CM | POA: Diagnosis not present

## 2021-05-15 DIAGNOSIS — Z6841 Body Mass Index (BMI) 40.0 and over, adult: Secondary | ICD-10-CM | POA: Diagnosis not present

## 2021-05-15 DIAGNOSIS — E669 Obesity, unspecified: Secondary | ICD-10-CM | POA: Diagnosis not present

## 2021-05-15 NOTE — Progress Notes (Signed)
Chief Complaint:   OBESITY Benjamin Shepard is here to discuss his progress with his obesity treatment plan along with follow-up of his obesity related diagnoses. Benjamin Shepard is on the Category 4 Plan and states he is following his eating plan approximately 75% of the time. Benjamin Shepard states he is doing 0 minutes 0 times per week.  Today's visit was #: 28 Starting weight: 342 lbs Starting date: 09/15/2019 Today's weight: 328 lbs Today's date: 05/15/2021 Total lbs lost to date: 14 lbs Total lbs lost since last in-office visit: 6 lbs  Interim History: Roverto is down 6 lbs since his last visit and doing better. He will get back on protein.   Subjective:   1. Essential hypertension Benjamin Shepard is taking HCTZ currently. His blood pressure is controlled. His last blood pressure was 146/64.  2. Type 2 diabetes mellitus with other specified complication, without long-term current use of insulin (HCC) Benjamin Shepard is taking Metformin. He has not been as active.   Assessment/Plan:   1. Essential hypertension Dermot will continue taking his medications. He is working on healthy weight loss and exercise to improve blood pressure control. We will watch for signs of hypotension as he continues his lifestyle modifications.  2. Type 2 diabetes mellitus with other specified complication, without long-term current use of insulin (HCC) Benjamin Shepard will continue Metformin. He will decrease carbohydrates and remain active. Good blood sugar control is important to decrease the likelihood of diabetic complications such as nephropathy, neuropathy, limb loss, blindness, coronary artery disease, and death. Intensive lifestyle modification including diet, exercise and weight loss are the first line of treatment for diabetes.   3. Obesity with current BMI of 47.0 Benjamin Shepard is currently in the action stage of change. As such, his goal is to continue with weight loss efforts. He has agreed to the Category 4 Plan.   Benjamin Shepard will adhere closely  to the plan 80-90%. He will keep protein high.   Exercise goals:  Benjamin Shepard will remain active and she will do stretching.  Behavioral modification strategies: increasing lean protein intake, decreasing simple carbohydrates, increasing vegetables, increasing water intake, decreasing eating out, no skipping meals, meal planning and cooking strategies, keeping healthy foods in the home, and planning for success.  Benjamin Shepard has agreed to follow-up with our clinic in 4 weeks. He was informed of the importance of frequent follow-up visits to maximize his success with intensive lifestyle modifications for his multiple health conditions.   Objective:   Blood pressure (!) 146/72, pulse 76, temperature 97.7 F (36.5 C), height 5\' 10"  (1.778 m), weight (!) 328 lb (148.8 kg), SpO2 96 %. Body mass index is 47.06 kg/m.  General: Cooperative, alert, well developed, in no acute distress. HEENT: Conjunctivae and lids unremarkable. Cardiovascular: Regular rhythm.  Lungs: Normal work of breathing. Neurologic: No focal deficits.   Lab Results  Component Value Date   CREATININE 0.91 10/17/2020   BUN 17 10/17/2020   NA 144 10/17/2020   K 4.3 10/17/2020   CL 103 10/17/2020   CO2 27 10/17/2020   Lab Results  Component Value Date   ALT 21 10/17/2020   AST 14 10/17/2020   ALKPHOS 91 10/17/2020   BILITOT 0.5 10/17/2020   Lab Results  Component Value Date   HGBA1C 5.6 10/17/2020   HGBA1C 5.6 03/13/2020   HGBA1C 6.2 (H) 09/15/2019   HGBA1C 5.9 (H) 05/22/2015   Lab Results  Component Value Date   INSULIN 17.8 10/17/2020   INSULIN 23.3 09/15/2019   Lab Results  Component Value Date   TSH 2.570 09/15/2019   Lab Results  Component Value Date   CHOL 102 10/17/2020   HDL 42 10/17/2020   LDLCALC 48 10/17/2020   TRIG 49 10/17/2020   Lab Results  Component Value Date   VD25OH 56.4 10/17/2020   VD25OH 55.6 03/13/2020   VD25OH 26.6 (L) 09/15/2019   Lab Results  Component Value Date   WBC 7.4  05/22/2015   HGB 11.3 (L) 06/01/2015   HCT 34.8 (L) 06/01/2015   MCV 93.3 05/22/2015   PLT 203 05/22/2015   No results found for: IRON, TIBC, FERRITIN  Attestation Statements:   Reviewed by clinician on day of visit: allergies, medications, problem list, medical history, surgical history, family history, social history, and previous encounter notes.  I, Lizbeth Bark, RMA, am acting as Location manager for [employer], DO.  I have reviewed the above documentation for accuracy and completeness, and I agree with the above. Jearld Lesch, DO

## 2021-05-16 ENCOUNTER — Encounter (INDEPENDENT_AMBULATORY_CARE_PROVIDER_SITE_OTHER): Payer: Self-pay | Admitting: Bariatrics

## 2021-05-21 ENCOUNTER — Other Ambulatory Visit: Payer: Self-pay | Admitting: Interventional Cardiology

## 2021-05-22 ENCOUNTER — Other Ambulatory Visit: Payer: Self-pay | Admitting: Interventional Cardiology

## 2021-05-24 NOTE — Progress Notes (Signed)
Cardiology Office Note:    Date:  05/25/2021   ID:  Mrk Buzby, DOB 1945-04-23, MRN [id number]  PCP:  Velna Hatchet, MD  Cardiologist:  [name] Grooms, MD   Referring MD: Velna Hatchet, MD   Chief Complaint  Patient presents with   Atrial Fibrillation   Shortness of Breath   Hypertension    History of Present Illness:    Benjamin Shepard is a 76 y.o. male with a hx of dyspnea on exertion, type 2 diabetes mellitus, hypertension, hyperlipidemia, morbid obesity, and obstructive sleep apnea.  New atrial fibrillation with controlled ventricular response.  Overall, Mr. Mesa does not complain today other than on Sunday he had chest tightness.  He is specifically explicit that it was not "pain".  It radiated into his jaws.  It lasted several hours.  It was mild.  He had no associated palpitations, diaphoresis, or nausea.  He is back to normal today.  He denies orthopnea.  He does have lower extremity swelling.  He has not noticed any change in exertional tolerance.  EKG today demonstrates atrial fibrillation with a controlled ventricular response.  CHA2DS2-VASc Score = 4  The patient's score is based upon: CHF History: 0 HTN History: 1 Diabetes History: 1 Stroke History: 0 Vascular Disease History: 0 Age Score: 2 Gender Score: 0   Past Medical History:  Diagnosis Date   Anxiety    Back pain    BPH (benign prostatic hyperplasia)    Diabetes (HCC)    Edema    Edema of both lower extremities    Foley catheter in place    Food allergy    Gout    HTN (hypertension)    Hyperlipidemia    Joint pain    OSA (obstructive sleep apnea)    USES C-PAP   Psoriasis    Shortness of breath dyspnea    WITH EXERTION   Urinary retention     Past Surgical History:  Procedure Laterality Date   BACK SURGERY     AGE 52   CARDIAC CATHETERIZATION N/A 04/14/2015   Procedure: Left Heart Cath and Coronary Angiography;  Surgeon: Belva Crome, MD;  Location: [facility];  Service: Cardiovascular;  Laterality: N/A;   ROBOT ASSISTED INGUINAL HERNIA REPAIR Bilateral 05/31/2015   Procedure: ROBOT ASSISTED INGUINAL HERNIA REPAIR;  Surgeon: Alexis Frock, MD;  Location: [facility];  Service: Urology;  Laterality: Bilateral;   XI ROBOTIC ASSISTED SIMPLE PROSTATECTOMY N/A 05/31/2015   Procedure: XI ROBOTIC ASSISTED SIMPLE PROSTATECTOMY;  Surgeon: Alexis Frock, MD;  Location: [facility];  Service: Urology;  Laterality: N/A;    Current Medications: Current Meds  Medication Sig   allopurinol (ZYLOPRIM) 100 MG tablet Take 100 mg by mouth daily.   apixaban (ELIQUIS) 5 MG TABS tablet Take 1 tablet (5 mg total) by mouth 2 (two) times daily.   atorvastatin (LIPITOR) 20 MG tablet Take 20 mg by mouth daily.   colchicine 0.6 MG tablet Take 0.6 mg by mouth daily as needed (gout).    diltiazem (TIAZAC) 360 MG 24 hr capsule Take 1 capsule (360 mg total) by mouth daily. Please make overdue appt for future refills Thank you 1st attempt   doxazosin (CARDURA) 8 MG tablet Take 2 tablets (16 mg total) by mouth daily. Please make overdue appt with Dr. Tamala Julian before anymore refills. Thank you 1st attempt   escitalopram (LEXAPRO) 10 MG tablet Take 10 mg by mouth daily.   furosemide (LASIX) 20 MG tablet Take 20  mg by mouth daily as needed (leg swelling).   hydrochlorothiazide (HYDRODIURIL) 25 MG tablet Take 1 tablet (25 mg total) by mouth daily. Please make overdue appt for future refills Thank you 1st attempt   Lancets (ONETOUCH ULTRASOFT) lancets Test BS twice a day.  ICS9-250.00   losartan (COZAAR) 100 MG tablet Take 1 tablet (100 mg total) by mouth daily.   metFORMIN (GLUCOPHAGE) 500 MG tablet Take 500 mg by mouth daily with breakfast.    oxyCODONE-acetaminophen (PERCOCET/ROXICET) 5-325 MG tablet Take 1-2 tablets by mouth every 4 to 6 hours as needed for pain.   testosterone cypionate (DEPOTESTOSTERONE CYPIONATE) 200 MG/ML injection every 21 ( twenty-one) days.   Turmeric (QC TUMERIC  COMPLEX PO) Take 1,000 mg by mouth daily.   Vitamin D, Ergocalciferol, (DRISDOL) 1.25 MG (50000 UNIT) CAPS capsule Take 1 capsule (50,000 Units total) by mouth every 7 (seven) days.   [DISCONTINUED] ASPIRIN 81 PO Take 81 mg by mouth daily.     Allergies:   Shrimp [shellfish allergy] and Other   Social History   Socioeconomic History   Marital status: Married    Spouse name: Not on file   Number of children: Not on file   Years of education: Not on file   Highest education level: Not on file  Occupational History   Not on file  Tobacco Use   Smoking status: Former    Types: Cigarettes    Quit date: 03/15/1998    Years since quitting: 23.2   Smokeless tobacco: Never  Substance and Sexual Activity   Alcohol use: Yes    Comment: rare wine, beer   Drug use: No   Sexual activity: Not on file  Other Topics Concern   Not on file  Social History Narrative   Not on file   Social Determinants of Health   Financial Resource Strain: Not on file  Food Insecurity: Not on file  Transportation Needs: Not on file  Physical Activity: Not on file  Stress: Not on file  Social Connections: Not on file     Family History: The patient's family history includes Anxiety disorder in his mother; Depression in his mother; Heart Problems in his mother; Heart disease in his brother, brother, and mother; Heart failure in his mother; High blood pressure in his mother; Hypertension in his father. There is no history of Colon cancer.  ROS:   Please see the history of present illness.    No blood in the urine or stool.  No neurological complaints.  All other systems reviewed and are negative.  EKGs/Labs/Other Studies Reviewed:    The following studies were reviewed today:  CARDIAC CATH 2017 Conclusion   False positive myocardial perfusion study.  Normal coronary arteries , with right dominant circulation.  Normal left ventricular systolic function. Mildly elevated end-diastolic pressure.     RECOMMENDATIONS:    The patient is now cleared to proceed with the upcoming prostate surgical procedure by Dr. Tresa Moore.  No significant cardiovascular abnormalities have been identified.  EKG:  EKG atrial fibrillation with controlled ventricular response at 84 bpm, incomplete right bundle, and when compared to the prior tracing performed 09/15/2019 for fibrillation is new.  Recent Labs: 10/17/2020: ALT 21; BUN 17; Creatinine, Ser 0.91; Potassium 4.3; Sodium 144  Recent Lipid Panel    Component Value Date/Time   CHOL 102 10/17/2020 1041   TRIG 49 10/17/2020 1041   HDL 42 10/17/2020 1041   LDLCALC 48 10/17/2020 1041    Physical Exam:  VS:  BP 110/60    Pulse 84    Ht 5\' 10"  (1.778 m)    Wt (!) 333 lb 12.8 oz (151.4 kg)    SpO2 96%    BMI 47.90 kg/m     Wt Readings from Last 3 Encounters:  05/25/21 (!) 333 lb 12.8 oz (151.4 kg)  05/15/21 (!) 328 lb (148.8 kg)  04/17/21 (!) 334 lb (151.5 kg)     GEN: Morbid obesity. No acute distress HEENT: Normal NECK: No JVD. LYMPHATICS: No lymphadenopathy CARDIAC: No murmur.  Irregularly irregular RR no gallop, with trace bilateral ankle and shin edema. VASCULAR:  Normal Pulses. No bruits. RESPIRATORY:  Clear to auscultation without rales, wheezing or rhonchi  ABDOMEN: Soft, non-tender, non-distended, No pulsatile mass, MUSCULOSKELETAL: No deformity  SKIN: Warm and dry NEUROLOGIC:  Alert and oriented x 3 PSYCHIATRIC:  Normal affect   ASSESSMENT:    1. Persistent atrial fibrillation ([place])   2. Essential hypertension   3. Dyspnea on exertion   4. OSA (obstructive sleep apnea)   5. Type 2 diabetes mellitus with other circulatory complication, with long-term current use of insulin ([place])   6. Diastolic dysfunction    PLAN:    In order of problems listed above:  Duration is unknown.  48-hour monitor will be done to determine if he is having paroxysmal's or in continuous A-fib.  2D Doppler echocardiogram will be performed to assess LV  function and to rule out systolic dysfunction.  Start Eliquis with CHA2DS2-VASc score of 4.  Check hemoglobin and creatinine.  Return in 1 month for follow-up.  Natural history of atrial fibrillation and the possible need for cardioversion discussed.  Stop turmeric. Blood pressure control is excellent on the current regimen of Cardura, Teah Zach, furosemide, HCTZ, Cozaar. Possibly diastolic heart failure and A-fib related.  Consider SGLT2 therapy. Ensure compliance with CPAP. Last hemoglobin A1c was acceptable at 5.7. 2D Doppler echocardiogram.   Medication Adjustments/Labs and Tests Ordered: Current medicines are reviewed at length with the patient today.  Concerns regarding medicines are outlined above.  Orders Placed This Encounter  Procedures   Basic metabolic panel   Pro b natriuretic peptide   CBC   LONG TERM MONITOR (3-14 DAYS)   EKG 12-Lead   ECHOCARDIOGRAM COMPLETE   Meds ordered this encounter  Medications   apixaban (ELIQUIS) 5 MG TABS tablet    Sig: Take 1 tablet (5 mg total) by mouth 2 (two) times daily.    Dispense:  60 tablet    Refill:  11    Patient Instructions  Medication Instructions:  1) DISCONTINUE Aspirin 2) START Eliquis 5mg  twice daily  *If you need a refill on your cardiac medications before your next appointment, please call your pharmacy*   Lab Work: BMET, CBC and Pro BNP today  If you have labs (blood work) drawn today and your tests are completely normal, you will receive your results only by: [place] (if you have MyChart) OR A paper copy in the mail If you have any lab test that is abnormal or we need to change your treatment, we will call you to review the results.   Testing/Procedures: Your physician has requested that you have an echocardiogram. Echocardiography is a painless test that uses sound waves to create images of your heart. It provides your doctor with information about the size and shape of your heart and how well your  hearts chambers and valves are working. This procedure takes approximately one hour. There are  no restrictions for this procedure.  Your physician recommends that you wear a monitor for 3 days.   Follow-Up: At [facility], you and your health needs are our priority.  As part of our continuing mission to provide you with exceptional heart care, we have created designated Provider Care Teams.  These Care Teams include your primary Cardiologist (physician) and Advanced Practice Providers (APPs -  Physician Assistants and Nurse Practitioners) who all work together to provide you with the care you need, when you need it.  We recommend signing up for the patient portal called "MyChart".  Sign up information is provided on this After Visit Summary.  MyChart is used to connect with patients for Virtual Visits (Telemedicine).  Patients are able to view lab/test results, encounter notes, upcoming appointments, etc.  Non-urgent messages can be sent to your provider as well.   To learn more about what you can do with MyChart, go to [email or web address].    Your next appointment:   1 month(s)  The format for your next appointment:   In Person  Provider:   [name] Grooms, MD     Other Instructions  [place] Monitor Instructions  Your physician has requested you wear a ZIO patch monitor for 3 days.  This is a single patch monitor. [employer] supplies one patch monitor per enrollment. Additional stickers are not available. Please do not apply patch if you will be having a Nuclear Stress Test,  Echocardiogram, Cardiac CT, MRI, or Chest Xray during the period you would be wearing the  monitor. The patch cannot be worn during these tests. You cannot remove and re-apply the  ZIO XT patch monitor.  Your ZIO patch monitor will be mailed 3 day USPS to your address on file. It may take 3-5 days  to receive your monitor after you have been enrolled.  Once you have received your monitor,  please review the enclosed instructions. Your monitor  has already been registered assigning a specific monitor serial # to you.  Billing and Patient Assistance Program Information  We have supplied [employer] with any of your insurance information on file for billing purposes. [employer] offers a sliding scale Patient Assistance Program for patients that do not have  insurance, or whose insurance does not completely cover the cost of the ZIO monitor.  You must apply for the Patient Assistance Program to qualify for this discounted rate.  To apply, please call [employer] at [phone], select option 4, select option 2, ask to apply for  Patient Assistance Program. Theodore Demark will ask your household income, and how many people  are in your household. They will quote your out-of-pocket cost based on that information.  [employer] will also be able to set up a 80-month, interest-free payment plan if needed.  Applying the monitor   Shave hair from upper left chest.  Hold abrader disc by orange tab. Rub abrader in 40 strokes over the upper left chest as  indicated in your monitor instructions.  Clean area with 4 enclosed alcohol pads. Let dry.  Apply patch as indicated in monitor instructions. Patch will be placed under collarbone on left  side of chest with arrow pointing upward.  Rub patch adhesive wings for 2 minutes. Remove white label marked "1". Remove the white  label marked "2". Rub patch adhesive wings for 2 additional minutes.  While looking in a mirror, press and release button in center of patch. A small green light will  flash 3-4 times. This  will be your only indicator that the monitor has been turned on.  Do not shower for the first 24 hours. You may shower after the first 24 hours.  Press the button if you feel a symptom. You will hear a small click. Record Date, Time and  Symptom in the Patient Logbook.  When you are ready to remove the patch, follow instructions on the last 2 pages of  Patient  Logbook. Stick patch monitor onto the last page of Patient Logbook.  Place Patient Logbook in the blue and white box. Use locking tab on box and tape box closed  securely. The blue and white box has prepaid postage on it. Please place it in the mailbox as  soon as possible. Your physician should have your test results approximately 7 days after the  monitor has been mailed back to [facility].  Call [place] at [phone] if you have questions regarding  your ZIO XT patch monitor. Call them immediately if you see an orange light blinking on your  monitor.  If your monitor falls off in less than 4 days, contact our Monitor department at [phone].  If your monitor becomes loose or falls off after 4 days call [employer] at [phone] for  suggestions on securing your monitor     Signed, [name] Grooms, MD  05/25/2021 12:40 PM    [place]

## 2021-05-25 ENCOUNTER — Encounter: Payer: Self-pay | Admitting: Interventional Cardiology

## 2021-05-25 ENCOUNTER — Other Ambulatory Visit: Payer: Self-pay

## 2021-05-25 ENCOUNTER — Ambulatory Visit (INDEPENDENT_AMBULATORY_CARE_PROVIDER_SITE_OTHER): Payer: Medicare Other | Admitting: Interventional Cardiology

## 2021-05-25 ENCOUNTER — Ambulatory Visit (INDEPENDENT_AMBULATORY_CARE_PROVIDER_SITE_OTHER): Payer: Medicare Other

## 2021-05-25 VITALS — BP 110/60 | HR 84 | Ht 70.0 in | Wt 333.8 lb

## 2021-05-25 DIAGNOSIS — R0609 Other forms of dyspnea: Secondary | ICD-10-CM | POA: Diagnosis not present

## 2021-05-25 DIAGNOSIS — I4819 Other persistent atrial fibrillation: Secondary | ICD-10-CM

## 2021-05-25 DIAGNOSIS — I10 Essential (primary) hypertension: Secondary | ICD-10-CM

## 2021-05-25 DIAGNOSIS — E1159 Type 2 diabetes mellitus with other circulatory complications: Secondary | ICD-10-CM | POA: Diagnosis not present

## 2021-05-25 DIAGNOSIS — I5189 Other ill-defined heart diseases: Secondary | ICD-10-CM

## 2021-05-25 DIAGNOSIS — I4891 Unspecified atrial fibrillation: Secondary | ICD-10-CM

## 2021-05-25 DIAGNOSIS — Z794 Long term (current) use of insulin: Secondary | ICD-10-CM

## 2021-05-25 DIAGNOSIS — G4733 Obstructive sleep apnea (adult) (pediatric): Secondary | ICD-10-CM

## 2021-05-25 MED ORDER — APIXABAN 5 MG PO TABS: 5.0000 mg | ORAL_TABLET | Freq: Two times a day (BID) | ORAL | 11 refills | Status: DC

## 2021-05-25 NOTE — Patient Instructions (Signed)
Medication Instructions:  1) DISCONTINUE Aspirin 2) START Eliquis 5mg  twice daily  *If you need a refill on your cardiac medications before your next appointment, please call your pharmacy*   Lab Work: BMET, CBC and Pro BNP today  If you have labs (blood work) drawn today and your tests are completely normal, you will receive your results only by: [place] (if you have MyChart) OR A paper copy in the mail If you have any lab test that is abnormal or we need to change your treatment, we will call you to review the results.   Testing/Procedures: Your physician has requested that you have an echocardiogram. Echocardiography is a painless test that uses sound waves to create images of your heart. It provides your doctor with information about the size and shape of your heart and how well your hearts chambers and valves are working. This procedure takes approximately one hour. There are no restrictions for this procedure.  Your physician recommends that you wear a monitor for 3 days.   Follow-Up: At [facility], you and your health needs are our priority.  As part of our continuing mission to provide you with exceptional heart care, we have created designated Provider Care Teams.  These Care Teams include your primary Cardiologist (physician) and Advanced Practice Providers (APPs -  Physician Assistants and Nurse Practitioners) who all work together to provide you with the care you need, when you need it.  We recommend signing up for the patient portal called "MyChart".  Sign up information is provided on this After Visit Summary.  MyChart is used to connect with patients for Virtual Visits (Telemedicine).  Patients are able to view lab/test results, encounter notes, upcoming appointments, etc.  Non-urgent messages can be sent to your provider as well.   To learn more about what you can do with MyChart, go to [email or web address].    Your next appointment:   1 month(s)  The  format for your next appointment:   In Person  Provider:   [name] Grooms, MD     Other Instructions  [place] Monitor Instructions  Your physician has requested you wear a ZIO patch monitor for 3 days.  This is a single patch monitor. [employer] supplies one patch monitor per enrollment. Additional stickers are not available. Please do not apply patch if you will be having a Nuclear Stress Test,  Echocardiogram, Cardiac CT, MRI, or Chest Xray during the period you would be wearing the  monitor. The patch cannot be worn during these tests. You cannot remove and re-apply the  ZIO XT patch monitor.  Your ZIO patch monitor will be mailed 3 day USPS to your address on file. It may take 3-5 days  to receive your monitor after you have been enrolled.  Once you have received your monitor, please review the enclosed instructions. Your monitor  has already been registered assigning a specific monitor serial # to you.  Billing and Patient Assistance Program Information  We have supplied [employer] with any of your insurance information on file for billing purposes. [employer] offers a sliding scale Patient Assistance Program for patients that do not have  insurance, or whose insurance does not completely cover the cost of the ZIO monitor.  You must apply for the Patient Assistance Program to qualify for this discounted rate.  To apply, please call [employer] at [phone], select option 4, select option 2, ask to apply for  Patient Assistance Program. Theodore Demark will ask your household  income, and how many people  are in your household. They will quote your out-of-pocket cost based on that information.  [employer] will also be able to set up a 59-month, interest-free payment plan if needed.  Applying the monitor   Shave hair from upper left chest.  Hold abrader disc by orange tab. Rub abrader in 40 strokes over the upper left chest as  indicated in your monitor instructions.  Clean area with  4 enclosed alcohol pads. Let dry.  Apply patch as indicated in monitor instructions. Patch will be placed under collarbone on left  side of chest with arrow pointing upward.  Rub patch adhesive wings for 2 minutes. Remove white label marked "1". Remove the white  label marked "2". Rub patch adhesive wings for 2 additional minutes.  While looking in a mirror, press and release button in center of patch. A small green light will  flash 3-4 times. This will be your only indicator that the monitor has been turned on.  Do not shower for the first 24 hours. You may shower after the first 24 hours.  Press the button if you feel a symptom. You will hear a small click. Record Date, Time and  Symptom in the Patient Logbook.  When you are ready to remove the patch, follow instructions on the last 2 pages of Patient  Logbook. Stick patch monitor onto the last page of Patient Logbook.  Place Patient Logbook in the blue and white box. Use locking tab on box and tape box closed  securely. The blue and white box has prepaid postage on it. Please place it in the mailbox as  soon as possible. Your physician should have your test results approximately 7 days after the  monitor has been mailed back to [facility].  Call [place] at [phone] if you have questions regarding  your ZIO XT patch monitor. Call them immediately if you see an orange light blinking on your  monitor.  If your monitor falls off in less than 4 days, contact our Monitor department at [phone].  If your monitor becomes loose or falls off after 4 days call [employer] at [phone] for  suggestions on securing your monitor

## 2021-05-25 NOTE — Progress Notes (Unsigned)
Applied a 3 day Zio XT monitor to patient in the office 

## 2021-05-26 LAB — CBC
Hematocrit: 42.8 % (ref 37.5–51.0)
Hemoglobin: 14.2 g/dL (ref 13.0–17.7)
MCH: 30.1 pg (ref 26.6–33.0)
MCHC: 33.2 g/dL (ref 31.5–35.7)
MCV: 91 fL (ref 79–97)
Platelets: 201 10*3/uL (ref 150–450)
RBC: 4.71 x10E6/uL (ref 4.14–5.80)
RDW: 12.4 % (ref 11.6–15.4)
WBC: 7.6 10*3/uL (ref 3.4–10.8)

## 2021-05-26 LAB — BASIC METABOLIC PANEL
BUN/Creatinine Ratio: 19 (ref 10–24)
BUN: 16 mg/dL (ref 8–27)
CO2: 23 mmol/L (ref 20–29)
Calcium: 9.3 mg/dL (ref 8.6–10.2)
Chloride: 99 mmol/L (ref 96–106)
Creatinine, Ser: 0.84 mg/dL (ref 0.76–1.27)
Glucose: 139 mg/dL — ABNORMAL HIGH (ref 70–99)
Potassium: 3.6 mmol/L (ref 3.5–5.2)
Sodium: 139 mmol/L (ref 134–144)
eGFR: 90 mL/min/{1.73_m2} (ref >59)

## 2021-05-26 LAB — PRO B NATRIURETIC PEPTIDE: NT-Pro BNP: 422 pg/mL (ref 0–486)

## 2021-05-28 ENCOUNTER — Other Ambulatory Visit: Payer: Self-pay

## 2021-05-28 ENCOUNTER — Ambulatory Visit (INDEPENDENT_AMBULATORY_CARE_PROVIDER_SITE_OTHER): Payer: Medicare Other | Admitting: Podiatry

## 2021-05-28 ENCOUNTER — Encounter: Payer: Self-pay | Admitting: Podiatry

## 2021-05-28 DIAGNOSIS — I4891 Unspecified atrial fibrillation: Secondary | ICD-10-CM | POA: Diagnosis not present

## 2021-05-28 DIAGNOSIS — B351 Tinea unguium: Secondary | ICD-10-CM

## 2021-05-28 DIAGNOSIS — E119 Type 2 diabetes mellitus without complications: Secondary | ICD-10-CM

## 2021-05-28 DIAGNOSIS — M79674 Pain in right toe(s): Secondary | ICD-10-CM

## 2021-05-28 DIAGNOSIS — M79675 Pain in left toe(s): Secondary | ICD-10-CM

## 2021-05-28 DIAGNOSIS — E291 Testicular hypofunction: Secondary | ICD-10-CM | POA: Insufficient documentation

## 2021-05-28 DIAGNOSIS — R972 Elevated prostate specific antigen [PSA]: Secondary | ICD-10-CM | POA: Insufficient documentation

## 2021-05-28 NOTE — Progress Notes (Signed)
This patient returns to my office for at risk foot care.  This patient requires this care by a professional since this patient will be at risk due to having  diabetes.This patient is unable to cut nails themselves since the patient cannot reach their nails.These nails are painful walking and wearing shoes.  This patient presents for at risk foot care today.  General Appearance  Alert, conversant and in no acute stress.  Vascular  Dorsalis pedis  are palpable  bilaterally. Posterior tibial pulses are not palpable. Capillary return is within normal limits  bilaterally. Temperature is within normal limits  bilaterally.  Neurologic  Senn-Weinstein monofilament wire test within normal limits  bilaterally. Muscle power within normal limits bilaterally.  Nails Thick disfigured discolored nails with subungual debris  from hallux to fifth toes bilaterally. No evidence of bacterial infection or drainage bilaterally.  Orthopedic  No limitations of motion  feet .  No crepitus or effusions noted.  No bony pathology or digital deformities noted.  Mild  HAV  B/L.  Skin  normotropic skin with no porokeratosis noted bilaterally.  No signs of infections or ulcers noted.     Onychomycosis  Pain in right toe  Pain in left toe.  Consent was obtained for treatment procedures.  Debridement and grinding of long thick nails with clearing of subungual debris.  No infection or ulcer.     Return office visit  3 months        Told patient to return for periodic foot care and evaluation due to potential at risk complications.   Shakema Surita DPM  

## 2021-05-30 ENCOUNTER — Other Ambulatory Visit: Payer: Self-pay

## 2021-05-30 ENCOUNTER — Ambulatory Visit (HOSPITAL_COMMUNITY): Payer: Medicare Other | Attending: Cardiology

## 2021-05-30 DIAGNOSIS — I4819 Other persistent atrial fibrillation: Secondary | ICD-10-CM | POA: Insufficient documentation

## 2021-05-30 LAB — ECHOCARDIOGRAM COMPLETE
AR max vel: 4.3 cm2
AV Area VTI: 4.41 cm2
AV Area mean vel: 4.28 cm2
AV Mean grad: 8 mmHg
AV Peak grad: 14.5 mmHg
Ao pk vel: 1.91 m/s
Area-P 1/2: 2.37 cm2
S' Lateral: 3.5 cm

## 2021-05-30 MED ORDER — PERFLUTREN LIPID MICROSPHERE
1.0000 mL | INTRAVENOUS | Status: AC | PRN
  Administered 2021-05-30: 1 mL via INTRAVENOUS

## 2021-06-05 DIAGNOSIS — I4891 Unspecified atrial fibrillation: Secondary | ICD-10-CM | POA: Diagnosis not present

## 2021-06-12 ENCOUNTER — Ambulatory Visit (INDEPENDENT_AMBULATORY_CARE_PROVIDER_SITE_OTHER): Payer: Medicare Other | Admitting: Bariatrics

## 2021-06-12 ENCOUNTER — Encounter (INDEPENDENT_AMBULATORY_CARE_PROVIDER_SITE_OTHER): Payer: Self-pay | Admitting: Bariatrics

## 2021-06-12 ENCOUNTER — Other Ambulatory Visit: Payer: Self-pay

## 2021-06-12 VITALS — BP 145/78 | HR 72 | Temp 97.6°F | Ht 70.0 in | Wt 324.0 lb

## 2021-06-12 DIAGNOSIS — E669 Obesity, unspecified: Secondary | ICD-10-CM

## 2021-06-12 DIAGNOSIS — Z6841 Body Mass Index (BMI) 40.0 and over, adult: Secondary | ICD-10-CM

## 2021-06-12 DIAGNOSIS — E1169 Type 2 diabetes mellitus with other specified complication: Secondary | ICD-10-CM | POA: Diagnosis not present

## 2021-06-12 DIAGNOSIS — I10 Essential (primary) hypertension: Secondary | ICD-10-CM | POA: Diagnosis not present

## 2021-06-12 DIAGNOSIS — E66813 Obesity, class 3: Secondary | ICD-10-CM

## 2021-06-12 DIAGNOSIS — Z7984 Long term (current) use of oral hypoglycemic drugs: Secondary | ICD-10-CM

## 2021-06-13 NOTE — Progress Notes (Signed)
? ? ? ?Chief Complaint:  ? ?OBESITY ?Seab is here to discuss his progress with his obesity treatment plan along with follow-up of his obesity related diagnoses. Sem is on the Category 3 Plan and states he is following his eating plan approximately 75% of the time. Corderius states he is using stretch bands for 20 minutes 3 times per week. ? ?Today's visit was #: 58 ?Starting weight: 342 lbs ?Starting date: 09/15/2019 ?Today's weight: 324 lbs ?Today's date: 06/12/2021 ?Total lbs lost to date: 18 lbs ?Total lbs lost since last in-office visit: 4 lbs ? ?Interim History: Cabe is down another 4 lbs since his last visit. He seems to be "back on track". He was diagnosed with atrial fibrillation and put on Eliquis.  ? ?Subjective:  ? ?1. Type 2 diabetes mellitus with other specified complication, without long-term current use of insulin ([place]) ?Sebastiano is taking Metformin currently.  ? ?2. Essential hypertension ?Hadden is taking Tiazac, Cardura, HCTZ and Cozaar. His blood pressure is reasonably well controlled. His last blood pressure was 146/72. ? ?Assessment/Plan:  ? ?1. Type 2 diabetes mellitus with other specified complication, without long-term current use of insulin ([place]) ?Jamael will continue his medications. He is staying as active as possible. Good blood sugar control is important to decrease the likelihood of diabetic complications such as nephropathy, neuropathy, limb loss, blindness, coronary artery disease, and death. Intensive lifestyle modification including diet, exercise and weight loss are the first line of treatment for diabetes.  ? ?2. Essential hypertension ?Jeffie will continue taking Tiazac, Cardura, HCTZ and Cozaar. He is working on healthy weight loss and exercise to improve blood pressure control. We will watch for signs of hypotension as he continues his lifestyle modifications. ? ?3. Obesity with current BMI of 46.6 ?Galen is currently in the action stage of change. As such, his goal is to  continue with weight loss efforts. He has agreed to the Category 3 Plan and practicing portion control and making smarter food choices, such as increasing vegetables and decreasing simple carbohydrates.  ? ?Ronel will continue meal planning and intentional eating. He will follow up with the cardiologist.  ? ?Exercise goals:  As is. Dequon will add walking.  ? ?Behavioral modification strategies: increasing lean protein intake, decreasing simple carbohydrates, increasing vegetables, increasing water intake, decreasing eating out, no skipping meals, meal planning and cooking strategies, keeping healthy foods in the home, and planning for success. ? ?Nathanyel has agreed to follow-up with our clinic in 4 weeks. He was informed of the importance of frequent follow-up visits to maximize his success with intensive lifestyle modifications for his multiple health conditions.  ? ?Objective:  ? ?Blood pressure (!) 145/78, pulse 72, temperature 97.6 ?F (36.4 ?C), height '5\' 10"'$  (1.778 m), weight (!) 324 lb (147 kg), SpO2 96 %. ?Body mass index is 46.49 kg/m?. ? ?General: Cooperative, alert, well developed, in no acute distress. ?HEENT: Conjunctivae and lids unremarkable. ?Cardiovascular: Regular rhythm.  ?Lungs: Normal work of breathing. ?Neurologic: No focal deficits.  ? ?Lab Results  ?Component Value Date  ? CREATININE 0.84 05/25/2021  ? BUN 16 05/25/2021  ? NA 139 05/25/2021  ? K 3.6 05/25/2021  ? CL 99 05/25/2021  ? CO2 23 05/25/2021  ? ?Lab Results  ?Component Value Date  ? ALT 21 10/17/2020  ? AST 14 10/17/2020  ? ALKPHOS 91 10/17/2020  ? BILITOT 0.5 10/17/2020  ? ?Lab Results  ?Component Value Date  ? HGBA1C 5.6 10/17/2020  ? HGBA1C 5.6 03/13/2020  ?  HGBA1C 6.2 (H) 09/15/2019  ? HGBA1C 5.9 (H) 05/22/2015  ? ?Lab Results  ?Component Value Date  ? INSULIN 17.8 10/17/2020  ? INSULIN 23.3 09/15/2019  ? ?Lab Results  ?Component Value Date  ? TSH 2.570 09/15/2019  ? ?Lab Results  ?Component Value Date  ? CHOL 102 10/17/2020  ?  HDL 42 10/17/2020  ? [place] 48 10/17/2020  ? TRIG 49 10/17/2020  ? ?Lab Results  ?Component Value Date  ? VD25OH 56.4 10/17/2020  ? VD25OH 55.6 03/13/2020  ? VD25OH 26.6 (L) 09/15/2019  ? ?Lab Results  ?Component Value Date  ? WBC 7.6 05/25/2021  ? HGB 14.2 05/25/2021  ? HCT 42.8 05/25/2021  ? MCV 91 05/25/2021  ? PLT 201 05/25/2021  ? ?No results found for: IRON, TIBC, FERRITIN ? ?Attestation Statements:  ? ?Reviewed by clinician on day of visit: allergies, medications, problem list, medical history, surgical history, family history, social history, and previous encounter notes. ? ?I, Lizbeth Bark, RMA, am acting as transcriptionist for [employer], DO. ? ?I have reviewed the above documentation for accuracy and completeness, and I agree with the above. Jearld Lesch, DO ? ?

## 2021-06-14 ENCOUNTER — Encounter (INDEPENDENT_AMBULATORY_CARE_PROVIDER_SITE_OTHER): Payer: Self-pay | Admitting: Bariatrics

## 2021-06-14 DIAGNOSIS — E291 Testicular hypofunction: Secondary | ICD-10-CM | POA: Diagnosis not present

## 2021-06-16 ENCOUNTER — Other Ambulatory Visit: Payer: Self-pay | Admitting: Interventional Cardiology

## 2021-06-18 ENCOUNTER — Other Ambulatory Visit: Payer: Self-pay

## 2021-06-18 MED ORDER — DILTIAZEM HCL ER BEADS 360 MG PO CP24: 360.0000 mg | ORAL_CAPSULE | Freq: Every day | ORAL | 3 refills | Status: DC

## 2021-06-18 MED ORDER — LOSARTAN POTASSIUM 100 MG PO TABS: 100.0000 mg | ORAL_TABLET | Freq: Every day | ORAL | 3 refills | Status: DC

## 2021-06-18 MED ORDER — HYDROCHLOROTHIAZIDE 25 MG PO TABS: 25.0000 mg | ORAL_TABLET | Freq: Every day | ORAL | 3 refills | Status: DC

## 2021-06-19 NOTE — Progress Notes (Signed)
?Cardiology Office Note:   ? ?Date:  06/21/2021  ? ?IDCorbyn Shepard, DOB 01/29/1946, MRN [id number] ? ?PCP:  Velna Hatchet, MD  ?Cardiologist:  [name] Grooms, MD  ? ?Referring MD: Velna Hatchet, MD  ? ?Chief Complaint  ?Patient presents with  ? Atrial Fibrillation  ? Follow-up  ?  Ascending aortic aneurysm  ? ? ?History of Present Illness:   ? ?Benjamin Shepard is a 76 y.o. male with a hx of dyspnea on exertion, type 2 diabetes mellitus, hypertension, hyperlipidemia, morbid obesity, and obstructive sleep apnea.  New atrial fibrillation with controlled ventricular response and subsequent monitor demonstrated AF burden  ? ? ?12% atrial fibrillation burden.  Asymptomatic.  No chest discomfort, dyspnea, orthopnea, or PND.  Was identified to be in atrial fibrillation during a routine office visit.  See long-term monitor below.  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 ? ?Past Medical History:  ?Diagnosis Date  ? Anxiety   ? Back pain   ? BPH (benign prostatic hyperplasia)   ? Diabetes ([place])   ? Edema   ? Edema of both lower extremities   ? Foley catheter in place   ? Food allergy   ? Gout   ? HTN (hypertension)   ? Hyperlipidemia   ? Joint pain   ? OSA (obstructive sleep apnea)   ? USES C-PAP  ? Psoriasis   ? Shortness of breath dyspnea   ? WITH EXERTION  ? Urinary retention   ? ? ?Past Surgical History:  ?Procedure Laterality Date  ? BACK SURGERY    ? AGE 58  ? CARDIAC CATHETERIZATION N/A 04/14/2015  ? Procedure: Left Heart Cath and Coronary Angiography;  Surgeon: Belva Crome, MD;  Location: [facility];  Service: Cardiovascular;  Laterality: N/A;  ? ROBOT ASSISTED INGUINAL HERNIA REPAIR Bilateral 05/31/2015  ? Procedure: ROBOT ASSISTED INGUINAL HERNIA REPAIR;  Surgeon: Alexis Frock, MD;  Location: [facility];  Service: Urology;  Laterality: Bilateral;  ? XI  ROBOTIC ASSISTED SIMPLE PROSTATECTOMY N/A 05/31/2015  ? Procedure: XI ROBOTIC ASSISTED SIMPLE PROSTATECTOMY;  Surgeon: Alexis Frock, MD;  Location: [facility];  Service: Urology;  Laterality: N/A;  ? ? ?Current Medications: ?Current Meds  ?Medication Sig  ? allopurinol (ZYLOPRIM) 100 MG tablet Take 100 mg by mouth daily.  ? apixaban (ELIQUIS) 5 MG TABS tablet Take 1 tablet (5 mg total) by mouth 2 (two) times daily.  ? atorvastatin (LIPITOR) 20 MG tablet Take 20 mg by mouth daily.  ? colchicine 0.6 MG tablet Take 0.6 mg by mouth daily as needed (gout).   ? diltiazem (TIAZAC) 360 MG 24 hr capsule Take 1 capsule (360 mg total) by mouth daily.  ? escitalopram (LEXAPRO) 10 MG tablet Take 10 mg by mouth daily.  ? furosemide (LASIX) 20 MG tablet Take 20 mg by mouth daily as needed (leg swelling).  ? hydrochlorothiazide (HYDRODIURIL) 25 MG tablet Take 1 tablet (25 mg total) by mouth daily.  ? Lancets (ONETOUCH ULTRASOFT) lancets Test BS twice a day.  ICS9-250.00  ? losartan (COZAAR) 100 MG tablet Take 1 tablet (100 mg total) by mouth daily.  ? metFORMIN (GLUCOPHAGE) 500 MG tablet Take 500 mg by mouth daily with breakfast.   ? oxyCODONE-acetaminophen (PERCOCET/ROXICET) 5-325 MG tablet Take 1-2 tablets by mouth every 4 to 6 hours as needed for pain.  ? testosterone cypionate (DEPOTESTOSTERONE CYPIONATE) 200  MG/ML injection every 21 ( twenty-one) days.  ? Turmeric (QC TUMERIC COMPLEX PO) Take 1,000 mg by mouth daily.  ? Vitamin D, Ergocalciferol, (DRISDOL) 1.25 MG (50000 UNIT) CAPS capsule Take 1 capsule (50,000 Units total) by mouth every 7 (seven) days.  ? [DISCONTINUED] doxazosin (CARDURA) 8 MG tablet Take 2 tablets (16 mg total) by mouth daily. Please make overdue appt with Dr. Tamala Julian before anymore refills. Thank you 1st attempt  ?  ? ?Allergies:   Shrimp [shellfish allergy] and Other  ? ?Social History  ? ?Socioeconomic History  ? Marital status: Married  ?  Spouse name: Not on file  ? Number of children: Not on file  ? Years  of education: Not on file  ? Highest education level: Not on file  ?Occupational History  ? Not on file  ?Tobacco Use  ? Smoking status: Former  ?  Types: Cigarettes  ?  Quit date: 03/15/1998  ?  Years since quitting: 23.2  ? Smokeless tobacco: Never  ?Substance and Sexual Activity  ? Alcohol use: Yes  ?  Comment: rare wine, beer  ? Drug use: No  ? Sexual activity: Not on file  ?Other Topics Concern  ? Not on file  ?Social History Narrative  ? Not on file  ? ?Social Determinants of Health  ? ?Financial Resource Strain: Not on file  ?Food Insecurity: Not on file  ?Transportation Needs: Not on file  ?Physical Activity: Not on file  ?Stress: Not on file  ?Social Connections: Not on file  ?  ? ?Family History: ?The patient's family history includes Anxiety disorder in his mother; Depression in his mother; Heart Problems in his mother; Heart disease in his brother, brother, and mother; Heart failure in his mother; High blood pressure in his mother; Hypertension in his father. There is no history of Colon cancer. ? ?ROS:   ?Please see the history of present illness.    ?No complaints.  No bleeding on Eliquis.  He is accompanied by his daughter.  We refilled Cardura although it is not typically a medication that we would prescribe.  I think there is confusion concerning to the origination of the prescription.  All other systems reviewed and are negative. ? ?EKGs/Labs/Other Studies Reviewed:   ? ?The following studies were reviewed today: ? ?CONTINUOUS MONITOR 06/05/2021: ?Paroxysmal atrial fibrillation with 12% burden and longest continuous episode lasting 5 hours and 5 minutes. Heart rate range 35 to 129 bpm. ?Basic underlying rhythm otherwise sinus rhythm and sinus bradycardia with heart rate range as noted above. ?Brief SVT versus short runs of A-fib. ?Ventricular ectopy less than 1% burden ? ?ECHOCARDIOGRAPHY 2023: ?IMPRESSIONS  ? 1. Left ventricular ejection fraction, by estimation, is 60 to 65%. The  ?left ventricle  has normal function. The left ventricle has no regional  ?wall motion abnormalities. There is mild concentric left ventricular  ?hypertrophy. Left ventricular diastolic  ?parameters were normal.  ? 2. Right ventricular systolic function is normal. The right ventricular  ?size is mildly enlarged. Tricuspid regurgitation signal is inadequate for  ?assessing PA pressure.  ? 3. The mitral valve is normal in structure. No evidence of mitral valve  ?regurgitation. No evidence of mitral stenosis.  ? 4. The aortic valve was not well visualized. Aortic valve regurgitation  ?is not visualized. No aortic stenosis is present. Aortic valve area, by  ?VTI measures 4.41 cm?Marland Kitchen Aortic valve mean gradient measures 8.0 mmHg.  ?Aortic valve Vmax measures 1.90 m/s.  ? 5. Aortic dilatation noted.  Aneurysm of the aortic arch, measuring 47 mm.  ?Aneurysm of the ascending aorta, measuring 48 mm.  ? 6. The inferior vena cava is normal in size with greater than 50%  ?respiratory variability, suggesting right atrial pressure of 3 mmHg.  ? ?EKG:  EKG no new data ? ?Recent Labs: ?10/17/2020: ALT 21 ?05/25/2021: BUN 16; Creatinine, Ser 0.84; Hemoglobin 14.2; NT-Pro BNP 422; Platelets 201; Potassium 3.6; Sodium 139  ?Recent Lipid Panel ?   ?Component Value Date/Time  ? CHOL 102 10/17/2020 1041  ? TRIG 49 10/17/2020 1041  ? HDL 42 10/17/2020 1041  ? [place] 48 10/17/2020 1041  ? ? ?Physical Exam:   ? ?VS:  BP (!) 134/58   Pulse 79   Ht '5\' 10"'$  (1.778 m)   Wt (!) 335 lb (152 kg)   SpO2 97%   BMI 48.07 kg/m?    ? ?Wt Readings from Last 3 Encounters:  ?06/21/21 (!) 335 lb (152 kg)  ?06/12/21 (!) 324 lb (147 kg)  ?05/25/21 (!) 333 lb 12.8 oz (151.4 kg)  ?  ? ?GEN: Morbid obesity. No acute distress ?HEENT: Normal ?NECK: No JVD. ?LYMPHATICS: No lymphadenopathy ?CARDIAC: No murmur. RRR no gallop, or edema. ?VASCULAR:  Normal Pulses. No bruits. ?RESPIRATORY:  Clear to auscultation without rales, wheezing or rhonchi  ?ABDOMEN: Soft, non-tender,  non-distended, No pulsatile mass, ?MUSCULOSKELETAL: No deformity  ?SKIN: Warm and dry ?NEUROLOGIC:  Alert and oriented x 3 ?PSYCHIATRIC:  Normal affect  ? ?ASSESSMENT:   ? ?1. Persistent atrial fibrillation ([place])   ?2.

## 2021-06-21 ENCOUNTER — Other Ambulatory Visit: Payer: Self-pay

## 2021-06-21 ENCOUNTER — Ambulatory Visit (INDEPENDENT_AMBULATORY_CARE_PROVIDER_SITE_OTHER): Payer: Medicare Other | Admitting: Interventional Cardiology

## 2021-06-21 ENCOUNTER — Encounter: Payer: Self-pay | Admitting: Interventional Cardiology

## 2021-06-21 VITALS — BP 134/58 | HR 79 | Ht 70.0 in | Wt 335.0 lb

## 2021-06-21 DIAGNOSIS — I7121 Aneurysm of the ascending aorta, without rupture: Secondary | ICD-10-CM

## 2021-06-21 DIAGNOSIS — Z794 Long term (current) use of insulin: Secondary | ICD-10-CM | POA: Diagnosis not present

## 2021-06-21 DIAGNOSIS — E1159 Type 2 diabetes mellitus with other circulatory complications: Secondary | ICD-10-CM | POA: Diagnosis not present

## 2021-06-21 DIAGNOSIS — G4733 Obstructive sleep apnea (adult) (pediatric): Secondary | ICD-10-CM | POA: Diagnosis not present

## 2021-06-21 DIAGNOSIS — I10 Essential (primary) hypertension: Secondary | ICD-10-CM

## 2021-06-21 DIAGNOSIS — I4819 Other persistent atrial fibrillation: Secondary | ICD-10-CM | POA: Diagnosis not present

## 2021-06-21 MED ORDER — DOXAZOSIN MESYLATE 8 MG PO TABS: 16.0000 mg | ORAL_TABLET | Freq: Every day | ORAL | 3 refills | Status: DC

## 2021-06-21 NOTE — Patient Instructions (Signed)
Medication Instructions:  ?Your physician recommends that you continue on your current medications as directed. Please refer to the Current Medication list given to you today. ? ?*If you need a refill on your cardiac medications before your next appointment, please call your pharmacy* ? ? ?Lab Work: ?BMET prior to CT ? ?If you have labs (blood work) drawn today and your tests are completely normal, you will receive your results only by: ?MyChart Message (if you have MyChart) OR ?A paper copy in the mail ?If you have any lab test that is abnormal or we need to change your treatment, we will call you to review the results. ? ? ?Testing/Procedures: ?Your physician recommends that you have a CT Aorta. ? ? ?Follow-Up: ?At [facility], you and your health needs are our priority.  As part of our continuing mission to provide you with exceptional heart care, we have created designated Provider Care Teams.  These Care Teams include your primary Cardiologist (physician) and Advanced Practice Providers (APPs -  Physician Assistants and Nurse Practitioners) who all work together to provide you with the care you need, when you need it. ? ?We recommend signing up for the patient portal called "MyChart".  Sign up information is provided on this After Visit Summary.  MyChart is used to connect with patients for Virtual Visits (Telemedicine).  Patients are able to view lab/test results, encounter notes, upcoming appointments, etc.  Non-urgent messages can be sent to your provider as well.   ?To learn more about what you can do with MyChart, go to [email or web address].   ? ?Your next appointment:   ?6 month(s) ? ?The format for your next appointment:   ?In Person ? ?Provider:   ?[name] Grooms, MD  ? ? ?Other Instructions ?  ?

## 2021-06-21 NOTE — Addendum Note (Signed)
Addended by: Loren Racer on: 06/21/2021 01:40 PM ? ? Modules accepted: Orders ? ?

## 2021-06-22 ENCOUNTER — Ambulatory Visit: Payer: Medicare Other | Admitting: Interventional Cardiology

## 2021-06-22 LAB — SPECIMEN STATUS REPORT

## 2021-06-22 LAB — BASIC METABOLIC PANEL
BUN/Creatinine Ratio: 16 (ref 10–24)
BUN: 16 mg/dL (ref 8–27)
CO2: 25 mmol/L (ref 20–29)
Calcium: 9.5 mg/dL (ref 8.6–10.2)
Chloride: 100 mmol/L (ref 96–106)
Creatinine, Ser: 1 mg/dL (ref 0.76–1.27)
Glucose: 136 mg/dL — ABNORMAL HIGH (ref 70–99)
Potassium: 3.7 mmol/L (ref 3.5–5.2)
Sodium: 140 mmol/L (ref 134–144)
eGFR: 78 mL/min/{1.73_m2} (ref >59)

## 2021-07-03 ENCOUNTER — Ambulatory Visit (HOSPITAL_COMMUNITY)
Admission: RE | Admit: 2021-07-03 | Discharge: 2021-07-03 | Disposition: A | Discharge status: Home / Self Care | Payer: Medicare Other | Source: Ambulatory Visit | Attending: Interventional Cardiology | Admitting: Interventional Cardiology

## 2021-07-03 DIAGNOSIS — I7121 Aneurysm of the ascending aorta, without rupture: Secondary | ICD-10-CM

## 2021-07-03 DIAGNOSIS — R0609 Other forms of dyspnea: Secondary | ICD-10-CM | POA: Diagnosis not present

## 2021-07-03 DIAGNOSIS — R911 Solitary pulmonary nodule: Secondary | ICD-10-CM | POA: Diagnosis not present

## 2021-07-03 MED ORDER — SODIUM CHLORIDE (PF) 0.9 % IJ SOLN
INTRAMUSCULAR | Status: AC
  Filled 2021-07-03: qty 50

## 2021-07-03 MED ORDER — IOHEXOL 350 MG/ML SOLN
100.0000 mL | Freq: Once | INTRAVENOUS | Status: AC | PRN
  Administered 2021-07-03: 100 mL via INTRAVENOUS

## 2021-07-05 DIAGNOSIS — E291 Testicular hypofunction: Secondary | ICD-10-CM | POA: Diagnosis not present

## 2021-07-06 ENCOUNTER — Other Ambulatory Visit: Payer: Self-pay | Admitting: *Deleted

## 2021-07-06 DIAGNOSIS — I7121 Aneurysm of the ascending aorta, without rupture: Secondary | ICD-10-CM

## 2021-07-16 ENCOUNTER — Ambulatory Visit (INDEPENDENT_AMBULATORY_CARE_PROVIDER_SITE_OTHER): Payer: Medicare Other | Admitting: Bariatrics

## 2021-07-16 ENCOUNTER — Other Ambulatory Visit (INDEPENDENT_AMBULATORY_CARE_PROVIDER_SITE_OTHER): Payer: Self-pay | Admitting: Bariatrics

## 2021-07-16 ENCOUNTER — Encounter (INDEPENDENT_AMBULATORY_CARE_PROVIDER_SITE_OTHER): Payer: Self-pay | Admitting: Bariatrics

## 2021-07-16 VITALS — BP 146/66 | HR 74 | Temp 97.4°F | Ht 70.0 in | Wt 330.0 lb

## 2021-07-16 DIAGNOSIS — E559 Vitamin D deficiency, unspecified: Secondary | ICD-10-CM | POA: Diagnosis not present

## 2021-07-16 DIAGNOSIS — Z6841 Body Mass Index (BMI) 40.0 and over, adult: Secondary | ICD-10-CM | POA: Diagnosis not present

## 2021-07-16 DIAGNOSIS — E669 Obesity, unspecified: Secondary | ICD-10-CM | POA: Diagnosis not present

## 2021-07-16 DIAGNOSIS — E7849 Other hyperlipidemia: Secondary | ICD-10-CM | POA: Diagnosis not present

## 2021-07-16 MED ORDER — VITAMIN D (ERGOCALCIFEROL) 1.25 MG (50000 UNIT) PO CAPS: 50000.0000 [IU] | ORAL_CAPSULE | ORAL | 0 refills | Status: DC

## 2021-07-19 DIAGNOSIS — Z20822 Contact with and (suspected) exposure to covid-19: Secondary | ICD-10-CM | POA: Diagnosis not present

## 2021-07-26 ENCOUNTER — Encounter (INDEPENDENT_AMBULATORY_CARE_PROVIDER_SITE_OTHER): Payer: Self-pay | Admitting: Bariatrics

## 2021-07-26 DIAGNOSIS — E291 Testicular hypofunction: Secondary | ICD-10-CM | POA: Diagnosis not present

## 2021-07-26 NOTE — Progress Notes (Signed)
? ? ? ?Chief Complaint:  ? ?OBESITY ?Benjamin Shepard is here to discuss his progress with his obesity treatment plan along with follow-up of his obesity related diagnoses. Benjamin Shepard is on the Category 4 Plan and states he is following his eating plan approximately 70% of the time. Benjamin Shepard states he will be more active. ? ?Today's visit was #: 30 ?Starting weight: 342 lbs ?Starting date: 09/15/2019 ?Today's weight: 330 lbs ?Today's date: 07/16/2021 ?Total lbs lost to date: 12 lbs ?Total lbs lost since last in-office visit: 0 ? ?Interim History: Benjamin Shepard is up 6 lbs since her last visit. He seems to have a plateau. He is getting more water.  ? ?Subjective:  ? ?1. Vitamin D deficiency ?Benjamin Shepard is taking Vitamin D currently. ? ?2. Other hyperlipidemia ?Benjamin Shepard is currently taking Lipitor. ? ?Assessment/Plan:  ? ?1. Vitamin D deficiency ?Low Vitamin D level contributes to fatigue and are associated with obesity, breast, and colon cancer. We will refill prescription Vitamin D 50,000 IU every week for 1 month with no refills and Benjamin Shepard will follow-up for routine testing of Vitamin D, at least 2-3 times per year to avoid over-replacement. ? ?- Vitamin D, Ergocalciferol, (DRISDOL) 1.25 MG (50000 UNIT) CAPS capsule; Take 1 capsule (50,000 Units total) by mouth every 7 (seven) days.  Dispense: 4 capsule; Refill: 0 ? ?2. Other hyperlipidemia ?Cardiovascular risk and specific lipid/LDL goals reviewed.  Benjamin Shepard will continue taking Lipitor. We discussed several lifestyle modifications today and Benjamin Shepard will continue to work on diet, exercise and weight loss efforts. Orders and follow up as documented in patient record.  ? ?Counseling ?Intensive lifestyle modifications are the first line treatment for this issue. ?Dietary changes: Increase soluble fiber. Decrease simple carbohydrates. ?Exercise changes: Moderate to vigorous-intensity aerobic activity 150 minutes per week if tolerated. ?Lipid-lowering medications: see documented in medical  record. ? ?3. Obesity with current BMI of 47.3 ?Benjamin Shepard is currently in the action stage of change. As such, his goal is to continue with weight loss efforts. He has agreed to the Category 4 Plan.  ? ?Benjamin Shepard will modify snacking and he will decrease eating out. He will increase his water and protein intake.  ? ?Exercise goals:  As is.  ? ?Behavioral modification strategies: increasing lean protein intake, decreasing simple carbohydrates, increasing vegetables, increasing water intake, decreasing eating out, no skipping meals, meal planning and cooking strategies, keeping healthy foods in the home, and planning for success. ? ?Benjamin Shepard has agreed to follow-up with our clinic in 5 weeks (fasting). He was informed of the importance of frequent follow-up visits to maximize his success with intensive lifestyle modifications for his multiple health conditions.  ? ?Objective:  ? ?Blood pressure (!) 146/66, pulse 74, temperature (!) 97.4 ?F (36.3 ?C), height '5\' 10"'$  (1.778 m), weight (!) 330 lb (149.7 kg), SpO2 96 %. ?Body mass index is 47.35 kg/m?. ? ?General: Cooperative, alert, well developed, in no acute distress. ?HEENT: Conjunctivae and lids unremarkable. ?Cardiovascular: Regular rhythm.  ?Lungs: Normal work of breathing. ?Neurologic: No focal deficits.  ? ?Lab Results  ?Component Value Date  ? CREATININE 1.00 06/21/2021  ? BUN 16 06/21/2021  ? NA 140 06/21/2021  ? K 3.7 06/21/2021  ? CL 100 06/21/2021  ? CO2 25 06/21/2021  ? ?Lab Results  ?Component Value Date  ? ALT 21 10/17/2020  ? AST 14 10/17/2020  ? ALKPHOS 91 10/17/2020  ? BILITOT 0.5 10/17/2020  ? ?Lab Results  ?Component Value Date  ? HGBA1C 5.6 10/17/2020  ? HGBA1C 5.6 03/13/2020  ?  HGBA1C 6.2 (H) 09/15/2019  ? HGBA1C 5.9 (H) 05/22/2015  ? ?Lab Results  ?Component Value Date  ? INSULIN 17.8 10/17/2020  ? INSULIN 23.3 09/15/2019  ? ?Lab Results  ?Component Value Date  ? TSH 2.570 09/15/2019  ? ?Lab Results  ?Component Value Date  ? CHOL 102 10/17/2020  ? HDL 42  10/17/2020  ? [place] 48 10/17/2020  ? TRIG 49 10/17/2020  ? ?Lab Results  ?Component Value Date  ? VD25OH 56.4 10/17/2020  ? VD25OH 55.6 03/13/2020  ? VD25OH 26.6 (L) 09/15/2019  ? ?Lab Results  ?Component Value Date  ? WBC 7.6 05/25/2021  ? HGB 14.2 05/25/2021  ? HCT 42.8 05/25/2021  ? MCV 91 05/25/2021  ? PLT 201 05/25/2021  ? ?No results found for: IRON, TIBC, FERRITIN ? ?Attestation Statements:  ? ?Reviewed by clinician on day of visit: allergies, medications, problem list, medical history, surgical history, family history, social history, and previous encounter notes. ? ?I, Lizbeth Bark, RMA, am acting as transcriptionist for [employer], DO. ? ?I have reviewed the above documentation for accuracy and completeness, and I agree with the above. Jearld Lesch, DO ? ?

## 2021-08-04 DIAGNOSIS — Z20822 Contact with and (suspected) exposure to covid-19: Secondary | ICD-10-CM | POA: Diagnosis not present

## 2021-08-14 DIAGNOSIS — E291 Testicular hypofunction: Secondary | ICD-10-CM | POA: Diagnosis not present

## 2021-08-14 DIAGNOSIS — E1121 Type 2 diabetes mellitus with diabetic nephropathy: Secondary | ICD-10-CM | POA: Diagnosis not present

## 2021-08-14 DIAGNOSIS — I714 Abdominal aortic aneurysm, without rupture, unspecified: Secondary | ICD-10-CM | POA: Diagnosis not present

## 2021-08-14 DIAGNOSIS — R972 Elevated prostate specific antigen [PSA]: Secondary | ICD-10-CM | POA: Diagnosis not present

## 2021-08-14 DIAGNOSIS — E785 Hyperlipidemia, unspecified: Secondary | ICD-10-CM | POA: Diagnosis not present

## 2021-08-14 DIAGNOSIS — N401 Enlarged prostate with lower urinary tract symptoms: Secondary | ICD-10-CM | POA: Diagnosis not present

## 2021-08-14 DIAGNOSIS — I4891 Unspecified atrial fibrillation: Secondary | ICD-10-CM | POA: Diagnosis not present

## 2021-08-14 DIAGNOSIS — I10 Essential (primary) hypertension: Secondary | ICD-10-CM | POA: Diagnosis not present

## 2021-08-14 DIAGNOSIS — D692 Other nonthrombocytopenic purpura: Secondary | ICD-10-CM | POA: Diagnosis not present

## 2021-08-14 DIAGNOSIS — D6859 Other primary thrombophilia: Secondary | ICD-10-CM | POA: Diagnosis not present

## 2021-08-28 ENCOUNTER — Ambulatory Visit (INDEPENDENT_AMBULATORY_CARE_PROVIDER_SITE_OTHER): Payer: Medicare Other | Admitting: Podiatry

## 2021-08-28 ENCOUNTER — Encounter: Payer: Self-pay | Admitting: Podiatry

## 2021-08-28 DIAGNOSIS — E119 Type 2 diabetes mellitus without complications: Secondary | ICD-10-CM

## 2021-08-28 DIAGNOSIS — M79674 Pain in right toe(s): Secondary | ICD-10-CM

## 2021-08-28 DIAGNOSIS — B351 Tinea unguium: Secondary | ICD-10-CM

## 2021-08-28 DIAGNOSIS — M79675 Pain in left toe(s): Secondary | ICD-10-CM

## 2021-08-28 NOTE — Progress Notes (Signed)
This patient returns to my office for at risk foot care.  This patient requires this care by a professional since this patient will be at risk due to having  diabetes.This patient is unable to cut nails themselves since the patient cannot reach their nails.These nails are painful walking and wearing shoes.  This patient presents for at risk foot care today.  General Appearance  Alert, conversant and in no acute stress.  Vascular  Dorsalis pedis  are palpable  bilaterally. Posterior tibial pulses are not palpable. Capillary return is within normal limits  bilaterally. Temperature is within normal limits  bilaterally.  Neurologic  Senn-Weinstein monofilament wire test within normal limits  bilaterally. Muscle power within normal limits bilaterally.  Nails Thick disfigured discolored nails with subungual debris  from hallux to fifth toes bilaterally. No evidence of bacterial infection or drainage bilaterally.  Orthopedic  No limitations of motion  feet .  No crepitus or effusions noted.  No bony pathology or digital deformities noted.  Mild  HAV  B/L.  Skin  normotropic skin with no porokeratosis noted bilaterally.  No signs of infections or ulcers noted.     Onychomycosis  Pain in right toe  Pain in left toe.  Consent was obtained for treatment procedures.  Debridement and grinding of long thick nails with clearing of subungual debris.  No infection or ulcer.     Return office visit  3 months        Told patient to return for periodic foot care and evaluation due to potential at risk complications.   Addisyn Leclaire DPM  

## 2021-08-30 ENCOUNTER — Encounter (INDEPENDENT_AMBULATORY_CARE_PROVIDER_SITE_OTHER): Payer: Self-pay | Admitting: Bariatrics

## 2021-08-30 ENCOUNTER — Ambulatory Visit (INDEPENDENT_AMBULATORY_CARE_PROVIDER_SITE_OTHER): Payer: Medicare Other | Admitting: Bariatrics

## 2021-08-30 VITALS — BP 144/68 | HR 73 | Temp 97.5°F | Ht 70.0 in | Wt 331.0 lb

## 2021-08-30 DIAGNOSIS — E1169 Type 2 diabetes mellitus with other specified complication: Secondary | ICD-10-CM | POA: Diagnosis not present

## 2021-08-30 DIAGNOSIS — E559 Vitamin D deficiency, unspecified: Secondary | ICD-10-CM

## 2021-08-30 DIAGNOSIS — E669 Obesity, unspecified: Secondary | ICD-10-CM | POA: Diagnosis not present

## 2021-08-30 DIAGNOSIS — E7849 Other hyperlipidemia: Secondary | ICD-10-CM

## 2021-08-30 DIAGNOSIS — Z6841 Body Mass Index (BMI) 40.0 and over, adult: Secondary | ICD-10-CM | POA: Diagnosis not present

## 2021-08-30 DIAGNOSIS — E785 Hyperlipidemia, unspecified: Secondary | ICD-10-CM | POA: Diagnosis not present

## 2021-08-30 MED ORDER — VITAMIN D (ERGOCALCIFEROL) 1.25 MG (50000 UNIT) PO CAPS: 50000.0000 [IU] | ORAL_CAPSULE | ORAL | 0 refills | Status: DC

## 2021-09-01 LAB — COMPREHENSIVE METABOLIC PANEL
ALT: 23 IU/L (ref 0–44)
AST: 19 IU/L (ref 0–40)
Albumin/Globulin Ratio: 1.9 (ref 1.2–2.2)
Albumin: 4.4 g/dL (ref 3.7–4.7)
Alkaline Phosphatase: 103 IU/L (ref 44–121)
BUN/Creatinine Ratio: 18 (ref 10–24)
BUN: 17 mg/dL (ref 8–27)
Bilirubin Total: 0.6 mg/dL (ref 0.0–1.2)
CO2: 26 mmol/L (ref 20–29)
Calcium: 9.2 mg/dL (ref 8.6–10.2)
Chloride: 99 mmol/L (ref 96–106)
Creatinine, Ser: 0.92 mg/dL (ref 0.76–1.27)
Globulin, Total: 2.3 g/dL (ref 1.5–4.5)
Glucose: 126 mg/dL — ABNORMAL HIGH (ref 70–99)
Potassium: 4.2 mmol/L (ref 3.5–5.2)
Sodium: 140 mmol/L (ref 134–144)
Total Protein: 6.7 g/dL (ref 6.0–8.5)
eGFR: 86 mL/min/{1.73_m2} (ref >59)

## 2021-09-01 LAB — LIPID PANEL WITH LDL/HDL RATIO
Cholesterol, Total: 119 mg/dL (ref 100–199)
HDL: 38 mg/dL — ABNORMAL LOW (ref >39)
LDL Chol Calc (NIH): 68 mg/dL (ref 0–99)
LDL/HDL Ratio: 1.8 ratio (ref 0.0–3.6)
Triglycerides: 59 mg/dL (ref 0–149)
VLDL Cholesterol Cal: 13 mg/dL (ref 5–40)

## 2021-09-01 LAB — VITAMIN D 25 HYDROXY (VIT D DEFICIENCY, FRACTURES): Vit D, 25-Hydroxy: 42.8 ng/mL (ref 30.0–100.0)

## 2021-09-01 LAB — INSULIN, RANDOM: INSULIN: 20.3 u[IU]/mL (ref 2.6–24.9)

## 2021-09-03 ENCOUNTER — Encounter (INDEPENDENT_AMBULATORY_CARE_PROVIDER_SITE_OTHER): Payer: Self-pay | Admitting: Bariatrics

## 2021-09-03 DIAGNOSIS — E786 Lipoprotein deficiency: Secondary | ICD-10-CM | POA: Insufficient documentation

## 2021-09-03 DIAGNOSIS — D6859 Other primary thrombophilia: Secondary | ICD-10-CM | POA: Insufficient documentation

## 2021-09-03 NOTE — Progress Notes (Signed)
Chief Complaint:   OBESITY Benjamin Benjamin Shepard is here to discuss his progress with his obesity treatment plan along with follow-up of his obesity related diagnoses. Benjamin Benjamin Shepard is on the Category 4 Plan and states he is following his eating plan approximately 70% of the time. Benjamin Benjamin Shepard states he is doing 0 minutes 0 times per week.  Today's visit was #: 48 Starting weight: 342 lbs Starting date: 09/15/2019 Today's weight: 331 lbs Today's date: 08/30/2021 Total lbs lost to date: 11 lbs Total lbs lost since last in-office visit: 0  Interim History: Benjamin Benjamin Shepard is up 1 lb since his last visit. He has hit a plateau in his weight but he is doing well with is protein and water.   Subjective:   1. Vitamin D deficiency Benjamin Benjamin Shepard is taking as directed.   2. Other hyperlipidemia Benjamin Benjamin Shepard is taking Lipitor.   3. Diabetes mellitus type 2 in obese Benjamin Benjamin Shepard is taking Metformin currently. His A1C is controlled at primary care physician.   Assessment/Plan:   1. Vitamin D deficiency Low Vitamin D level contributes to fatigue and are associated with obesity, breast, and colon cancer. We will refill prescription Vitamin D 50,000 IU every week for 1 month with no refills and Benjamin Benjamin Shepard will follow-up for routine testing of Vitamin D, at least 2-3 times per year to avoid over-replacement. We will check Vitamin D today.   - Vitamin D, Ergocalciferol, (DRISDOL) 1.25 MG (50000 UNIT) CAPS capsule; Take 1 capsule (50,000 Units total) by mouth every 7 (seven) days.  Dispense: 4 capsule; Refill: 0  - VITAMIN D 25 Hydroxy (Vit-D Deficiency, Fractures)  2. Other hyperlipidemia Cardiovascular risk and specific lipid/LDL goals reviewed.  Benjamin Shepard will continue taking Crestor. We will check CMP and Lipids today. We discussed several lifestyle modifications today and Benjamin Shepard will continue to work on diet, exercise and weight loss efforts. Orders and follow up as documented in patient record.   Counseling Intensive lifestyle modifications  are the first line treatment for this issue. Dietary changes: Increase soluble fiber. Decrease simple carbohydrates. Exercise changes: Moderate to vigorous-intensity aerobic activity 150 minutes per week if tolerated. Lipid-lowering medications: see documented in medical record.  - Lipid Panel With LDL/HDL Ratio - Comprehensive metabolic panel  3. Diabetes mellitus type 2 in obese Benjamin Benjamin Shepard) We will check insulin today. Good blood sugar control is important to decrease the likelihood of diabetic complications such as nephropathy, neuropathy, limb loss, blindness, coronary artery disease, and death. Intensive lifestyle modification including diet, exercise and weight loss are the first line of treatment for diabetes.   - Insulin, random  4. Obesity, Current BMI 47.6 Benjamin Shepard is currently in the action stage of change. As such, his goal is to continue with weight loss efforts. He has agreed to the Category 4 Plan.   Benjamin Benjamin Shepard will continue meal planning and he will be mindful eating. He will eat healthier snacks and decrease carbohydrates.   Exercise goals: No exercise has been prescribed at this time.  Behavioral modification strategies: increasing lean protein intake, decreasing simple carbohydrates, increasing vegetables, increasing water intake, decreasing eating out, no skipping meals, meal planning and cooking strategies, keeping healthy foods in the home, and planning for success.  Benjamin Benjamin Shepard has agreed to follow-up with our clinic in 4-5 weeks. He was informed of the importance of frequent follow-up visits to maximize his success with intensive lifestyle modifications for his multiple health conditions.   Benjamin Benjamin Shepard was informed we would discuss his lab results at his next visit unless there is  a critical issue that needs to be addressed sooner. Benjamin Benjamin Shepard agreed to keep his next visit at the agreed upon time to discuss these results.  Objective:   Blood pressure (!) 144/68, pulse 73, temperature (!)  97.5 F (36.4 C), height '5\' 10"'$  (1.778 m), weight (!) 331 lb (150.1 kg), SpO2 96 %. Body mass index is 47.49 kg/m.  General: Cooperative, alert, well developed, in no acute distress. HEENT: Conjunctivae and lids unremarkable. Cardiovascular: Regular rhythm.  Lungs: Normal work of breathing. Neurologic: No focal deficits.   Lab Results  Component Value Date   CREATININE 0.92 08/30/2021   BUN 17 08/30/2021   NA 140 08/30/2021   K 4.2 08/30/2021   CL 99 08/30/2021   CO2 26 08/30/2021   Lab Results  Component Value Date   ALT 23 08/30/2021   AST 19 08/30/2021   ALKPHOS 103 08/30/2021   BILITOT 0.6 08/30/2021   Lab Results  Component Value Date   HGBA1C 5.6 10/17/2020   HGBA1C 5.6 03/13/2020   HGBA1C 6.2 (H) 09/15/2019   HGBA1C 5.9 (H) 05/22/2015   Lab Results  Component Value Date   INSULIN 20.3 08/30/2021   INSULIN 17.8 10/17/2020   INSULIN 23.3 09/15/2019   Lab Results  Component Value Date   TSH 2.570 09/15/2019   Lab Results  Component Value Date   CHOL 119 08/30/2021   HDL 38 (L) 08/30/2021   LDLCALC 68 08/30/2021   TRIG 59 08/30/2021   Lab Results  Component Value Date   VD25OH 42.8 08/30/2021   VD25OH 56.4 10/17/2020   VD25OH 55.6 03/13/2020   Lab Results  Component Value Date   WBC 7.6 05/25/2021   HGB 14.2 05/25/2021   HCT 42.8 05/25/2021   MCV 91 05/25/2021   PLT 201 05/25/2021   No results found for: IRON, TIBC, FERRITIN  Attestation Statements:   Reviewed by clinician on day of visit: allergies, medications, problem list, medical history, surgical history, family history, social history, and previous encounter notes.  I, Lizbeth Bark, RMA, am acting as Location manager for [employer], DO.  I have reviewed the above documentation for accuracy and completeness, and I agree with the above. Jearld Lesch, DO

## 2021-09-06 ENCOUNTER — Encounter (INDEPENDENT_AMBULATORY_CARE_PROVIDER_SITE_OTHER): Payer: Self-pay | Admitting: Bariatrics

## 2021-09-06 DIAGNOSIS — E291 Testicular hypofunction: Secondary | ICD-10-CM | POA: Diagnosis not present

## 2021-09-27 ENCOUNTER — Ambulatory Visit (INDEPENDENT_AMBULATORY_CARE_PROVIDER_SITE_OTHER): Payer: Medicare Other | Admitting: Bariatrics

## 2021-09-27 ENCOUNTER — Encounter (INDEPENDENT_AMBULATORY_CARE_PROVIDER_SITE_OTHER): Payer: Self-pay | Admitting: Bariatrics

## 2021-09-27 VITALS — BP 135/69 | HR 72 | Temp 97.7°F | Ht 70.0 in | Wt 331.0 lb

## 2021-09-27 DIAGNOSIS — Z6841 Body Mass Index (BMI) 40.0 and over, adult: Secondary | ICD-10-CM | POA: Diagnosis not present

## 2021-09-27 DIAGNOSIS — E88819 Insulin resistance, unspecified: Secondary | ICD-10-CM | POA: Insufficient documentation

## 2021-09-27 DIAGNOSIS — E786 Lipoprotein deficiency: Secondary | ICD-10-CM | POA: Diagnosis not present

## 2021-09-27 DIAGNOSIS — E669 Obesity, unspecified: Secondary | ICD-10-CM

## 2021-09-27 DIAGNOSIS — E8881 Metabolic syndrome: Secondary | ICD-10-CM | POA: Diagnosis not present

## 2021-09-27 DIAGNOSIS — E559 Vitamin D deficiency, unspecified: Secondary | ICD-10-CM | POA: Diagnosis not present

## 2021-09-27 MED ORDER — VITAMIN D (ERGOCALCIFEROL) 1.25 MG (50000 UNIT) PO CAPS: 50000.0000 [IU] | ORAL_CAPSULE | ORAL | 0 refills | Status: DC

## 2021-10-01 NOTE — Progress Notes (Unsigned)
Chief Complaint:   OBESITY Benjamin Shepard is here to discuss his progress with his obesity treatment plan along with follow-up of his obesity related diagnoses. Benjamin Shepard is on the Category 4 Plan and states he is following his eating plan approximately 70% of the time. Benjamin Shepard states he is doing 0 minutes 0 times per week.  Today's visit was #: 6 Starting weight: 342 lbs Starting date: 09/15/2019 Today's weight: 331 lbs Today's date: 09/27/2021 Total lbs lost to date: 11 Total lbs lost since last in-office visit: 0  Interim History: Benjamin Shepard's weight remains the same since his last visit.   Subjective:   1. Vitamin D deficiency Benjamin Shepard is taking vitamin D as directed.  2. Low HDL (under 40) Benjamin Shepard's last HDL was 38.  3. Insulin resistance Benjamin Shepard's last A1c was 5.6 and insulin 20.3.  His A1c had been in the prediabetic range at 6.2.  Assessment/Plan:   1. Vitamin D deficiency We will refill prescription vitamin D 50,000 units once weekly for 1 month.  - Vitamin D, Ergocalciferol, (DRISDOL) 1.25 MG (50000 UNIT) CAPS capsule; Take 1 capsule (50,000 Units total) by mouth every 7 (seven) days.  Dispense: 4 capsule; Refill: 0  2. Low HDL (under 40) Benjamin Shepard is to increase his exercise/activities as tolerated.  He will keep his carbohydrates low, both sweets and starches.  He will pair protein with healthy carbohydrates.  3. Insulin resistance Benjamin Shepard will continue to work on the meal plan and exercise.  4. Obesity, Current BMI 47.6 Benjamin Shepard is currently in the action stage of change. As such, his goal is to continue with weight loss efforts. He has agreed to the Category 4 Plan.   Mindful eating was discussed. He will adhere closely to the plan 80-90%. Reviewed labs with the patient from 08/30/2021, CMP, lipids, vitamin D, A1c, and insulin.  Exercise goals: Benjamin Shepard is to be more active.  Behavioral modification strategies: increasing lean protein intake, decreasing simple carbohydrates,  increasing vegetables, increasing water intake, decreasing eating out, no skipping meals, meal planning and cooking strategies, keeping healthy foods in the home, and planning for success.  Benjamin Shepard has agreed to follow-up with our clinic in 4 weeks. He was informed of the importance of frequent follow-up visits to maximize his success with intensive lifestyle modifications for his multiple health conditions.   Objective:   Blood pressure 135/69, pulse 72, temperature 97.7 F (36.5 C), height '5\' 10"'$  (1.778 m), weight (!) 331 lb (150.1 kg), SpO2 96 %. Body mass index is 47.49 kg/m.  General: Cooperative, alert, well developed, in no acute distress. HEENT: Conjunctivae and lids unremarkable. Cardiovascular: Regular rhythm.  Lungs: Normal work of breathing. Neurologic: No focal deficits.   Lab Results  Component Value Date   CREATININE 0.92 08/30/2021   BUN 17 08/30/2021   NA 140 08/30/2021   K 4.2 08/30/2021   CL 99 08/30/2021   CO2 26 08/30/2021   Lab Results  Component Value Date   ALT 23 08/30/2021   AST 19 08/30/2021   ALKPHOS 103 08/30/2021   BILITOT 0.6 08/30/2021   Lab Results  Component Value Date   HGBA1C 5.6 10/17/2020   HGBA1C 5.6 03/13/2020   HGBA1C 6.2 (H) 09/15/2019   HGBA1C 5.9 (H) 05/22/2015   Lab Results  Component Value Date   INSULIN 20.3 08/30/2021   INSULIN 17.8 10/17/2020   INSULIN 23.3 09/15/2019   Lab Results  Component Value Date   TSH 2.570 09/15/2019   Lab Results  Component Value  Date   CHOL 119 08/30/2021   HDL 38 (L) 08/30/2021   LDLCALC 68 08/30/2021   TRIG 59 08/30/2021   Lab Results  Component Value Date   VD25OH 42.8 08/30/2021   VD25OH 56.4 10/17/2020   VD25OH 55.6 03/13/2020   Lab Results  Component Value Date   WBC 7.6 05/25/2021   HGB 14.2 05/25/2021   HCT 42.8 05/25/2021   MCV 91 05/25/2021   PLT 201 05/25/2021   No results found for: "IRON", "TIBC", "FERRITIN"  Attestation Statements:   Reviewed by  clinician on day of visit: allergies, medications, problem list, medical history, surgical history, family history, social history, and previous encounter notes.   Wilhemena Durie, am acting as Location manager for [employer], DO.  I have reviewed the above documentation for accuracy and completeness, and I agree with the above. Jearld Lesch, DO

## 2021-10-03 ENCOUNTER — Encounter (INDEPENDENT_AMBULATORY_CARE_PROVIDER_SITE_OTHER): Payer: Self-pay | Admitting: Bariatrics

## 2021-10-04 DIAGNOSIS — E291 Testicular hypofunction: Secondary | ICD-10-CM | POA: Diagnosis not present

## 2021-10-31 DIAGNOSIS — E291 Testicular hypofunction: Secondary | ICD-10-CM | POA: Diagnosis not present

## 2021-11-01 ENCOUNTER — Ambulatory Visit (INDEPENDENT_AMBULATORY_CARE_PROVIDER_SITE_OTHER): Payer: Medicare Other | Admitting: Bariatrics

## 2021-11-01 ENCOUNTER — Encounter (INDEPENDENT_AMBULATORY_CARE_PROVIDER_SITE_OTHER): Payer: Self-pay | Admitting: Bariatrics

## 2021-11-01 VITALS — BP 125/59 | HR 66 | Temp 97.9°F | Ht 70.0 in | Wt 330.0 lb

## 2021-11-01 DIAGNOSIS — Z6841 Body Mass Index (BMI) 40.0 and over, adult: Secondary | ICD-10-CM | POA: Diagnosis not present

## 2021-11-01 DIAGNOSIS — E8881 Metabolic syndrome: Secondary | ICD-10-CM | POA: Diagnosis not present

## 2021-11-01 DIAGNOSIS — E559 Vitamin D deficiency, unspecified: Secondary | ICD-10-CM | POA: Diagnosis not present

## 2021-11-01 DIAGNOSIS — E669 Obesity, unspecified: Secondary | ICD-10-CM | POA: Diagnosis not present

## 2021-11-01 MED ORDER — VITAMIN D (ERGOCALCIFEROL) 1.25 MG (50000 UNIT) PO CAPS: 50000.0000 [IU] | ORAL_CAPSULE | ORAL | 0 refills | Status: DC

## 2021-11-07 ENCOUNTER — Encounter (INDEPENDENT_AMBULATORY_CARE_PROVIDER_SITE_OTHER): Payer: Self-pay

## 2021-11-12 NOTE — Progress Notes (Unsigned)
Chief Complaint:   OBESITY Benjamin Shepard is here to discuss his progress with his obesity treatment plan along with follow-up of his obesity related diagnoses. Benjamin Shepard is on the Category 4 Plan and states he is following his eating plan approximately 50% of the time. Benjamin Shepard states he is doing 0 minutes 0 times per week.  Today's visit was #: 42 Starting weight: 342 lbs Starting date: 09/15/2019 Today's weight: 330 lbs Today's date: 11/01/2021 Total lbs lost to date: 12 Total lbs lost since last in-office visit: 1  Interim History: Benjamin Shepard is down an additional 1 pound since his last visit.  He has been traveling and had some celebrations.  Subjective:   1. Vitamin D deficiency Benjamin Shepard is taking Vitamin D as directed.   2. Insulin resistance Benjamin Shepard is taking metformin.   Assessment/Plan:   1. Vitamin D deficiency We will refill prescription Vitamin D 50,000 IU every week for 1 month. Benjamin Shepard will follow-up for routine testing of Vitamin D, at least 2-3 times per year to avoid over-replacement.  - Vitamin D, Ergocalciferol, (DRISDOL) 1.25 MG (50000 UNIT) CAPS capsule; Take 1 capsule (50,000 Units total) by mouth every 7 (seven) days.  Dispense: 4 capsule; Refill: 0  2. Insulin resistance Benjamin Shepard will continue metformin. He will keep his carbohydrates low (sugars and starches).   3. Obesity, current BMI 47.4 Benjamin Shepard is currently in the action stage of change. As such, his goal is to continue with weight loss efforts. He has agreed to the Category 4 Plan.   He will adhere closely to the plan 80-90%.  We will get back on track.  Exercise goals: No exercise has been prescribed at this time.  Behavioral modification strategies: increasing lean protein intake, decreasing simple carbohydrates, increasing vegetables, increasing water intake, decreasing eating out, no skipping meals, meal planning and cooking strategies, keeping healthy foods in the home, and planning for success.  Benjamin Shepard has  agreed to follow-up with our clinic in 4 to 5 weeks. He was informed of the importance of frequent follow-up visits to maximize his success with intensive lifestyle modifications for his multiple health conditions.   Objective:   Blood pressure (!) 125/59, pulse 66, temperature 97.9 F (36.6 C), height '5\' 10"'$  (1.778 m), weight (!) 330 lb (149.7 kg), SpO2 95 %. Body mass index is 47.35 kg/m.  General: Cooperative, alert, well developed, in no acute distress. HEENT: Conjunctivae and lids unremarkable. Cardiovascular: Regular rhythm.  Lungs: Normal work of breathing. Neurologic: No focal deficits.   Lab Results  Component Value Date   CREATININE 0.92 08/30/2021   BUN 17 08/30/2021   NA 140 08/30/2021   K 4.2 08/30/2021   CL 99 08/30/2021   CO2 26 08/30/2021   Lab Results  Component Value Date   ALT 23 08/30/2021   AST 19 08/30/2021   ALKPHOS 103 08/30/2021   BILITOT 0.6 08/30/2021   Lab Results  Component Value Date   HGBA1C 5.6 10/17/2020   HGBA1C 5.6 03/13/2020   HGBA1C 6.2 (H) 09/15/2019   HGBA1C 5.9 (H) 05/22/2015   Lab Results  Component Value Date   INSULIN 20.3 08/30/2021   INSULIN 17.8 10/17/2020   INSULIN 23.3 09/15/2019   Lab Results  Component Value Date   TSH 2.570 09/15/2019   Lab Results  Component Value Date   CHOL 119 08/30/2021   HDL 38 (L) 08/30/2021   LDLCALC 68 08/30/2021   TRIG 59 08/30/2021   Lab Results  Component Value Date  VD25OH 42.8 08/30/2021   VD25OH 56.4 10/17/2020   VD25OH 55.6 03/13/2020   Lab Results  Component Value Date   WBC 7.6 05/25/2021   HGB 14.2 05/25/2021   HCT 42.8 05/25/2021   MCV 91 05/25/2021   PLT 201 05/25/2021   No results found for: "IRON", "TIBC", "FERRITIN"  Attestation Statements:   Reviewed by clinician on day of visit: allergies, medications, problem list, medical history, surgical history, family history, social history, and previous encounter notes.  Wilhemena Durie, am acting as  Location manager for [employer], DO.  I have reviewed the above documentation for accuracy and completeness, and I agree with the above. Jearld Lesch, DO

## 2021-11-14 ENCOUNTER — Encounter (INDEPENDENT_AMBULATORY_CARE_PROVIDER_SITE_OTHER): Payer: Self-pay | Admitting: Bariatrics

## 2021-11-22 DIAGNOSIS — E291 Testicular hypofunction: Secondary | ICD-10-CM | POA: Diagnosis not present

## 2021-11-27 ENCOUNTER — Ambulatory Visit (INDEPENDENT_AMBULATORY_CARE_PROVIDER_SITE_OTHER): Payer: Medicare Other | Admitting: Podiatry

## 2021-11-27 ENCOUNTER — Encounter: Payer: Self-pay | Admitting: Podiatry

## 2021-11-27 DIAGNOSIS — E119 Type 2 diabetes mellitus without complications: Secondary | ICD-10-CM

## 2021-11-27 DIAGNOSIS — M79674 Pain in right toe(s): Secondary | ICD-10-CM | POA: Diagnosis not present

## 2021-11-27 DIAGNOSIS — M79675 Pain in left toe(s): Secondary | ICD-10-CM

## 2021-11-27 DIAGNOSIS — B351 Tinea unguium: Secondary | ICD-10-CM

## 2021-11-27 NOTE — Progress Notes (Signed)
This patient returns to my office for at risk foot care.  This patient requires this care by a professional since this patient will be at risk due to having  diabetes.This patient is unable to cut nails themselves since the patient cannot reach their nails.These nails are painful walking and wearing shoes.  This patient presents for at risk foot care today.  General Appearance  Alert, conversant and in no acute stress.  Vascular  Dorsalis pedis  are palpable  bilaterally. Posterior tibial pulses are not palpable. Capillary return is within normal limits  bilaterally. Temperature is within normal limits  bilaterally.  Neurologic  Senn-Weinstein monofilament wire test within normal limits  bilaterally. Muscle power within normal limits bilaterally.  Nails Thick disfigured discolored nails with subungual debris  from hallux to fifth toes bilaterally. No evidence of bacterial infection or drainage bilaterally.  Orthopedic  No limitations of motion  feet .  No crepitus or effusions noted.  No bony pathology or digital deformities noted.  Mild  HAV  B/L.  Skin  normotropic skin with no porokeratosis noted bilaterally.  No signs of infections or ulcers noted.     Onychomycosis  Pain in right toe  Pain in left toe.  Consent was obtained for treatment procedures.  Debridement and grinding of long thick nails with clearing of subungual debris.  No infection or ulcer.     Return office visit  3 months        Told patient to return for periodic foot care and evaluation due to potential at risk complications.   Mollyann Halbert DPM  

## 2021-12-05 ENCOUNTER — Encounter (INDEPENDENT_AMBULATORY_CARE_PROVIDER_SITE_OTHER): Payer: Self-pay | Admitting: Bariatrics

## 2021-12-05 ENCOUNTER — Ambulatory Visit (INDEPENDENT_AMBULATORY_CARE_PROVIDER_SITE_OTHER): Payer: Medicare Other | Admitting: Bariatrics

## 2021-12-05 VITALS — BP 152/71 | HR 67 | Temp 97.8°F | Ht 70.0 in | Wt 333.0 lb

## 2021-12-05 DIAGNOSIS — E669 Obesity, unspecified: Secondary | ICD-10-CM

## 2021-12-05 DIAGNOSIS — E786 Lipoprotein deficiency: Secondary | ICD-10-CM | POA: Diagnosis not present

## 2021-12-05 DIAGNOSIS — E559 Vitamin D deficiency, unspecified: Secondary | ICD-10-CM | POA: Diagnosis not present

## 2021-12-05 DIAGNOSIS — Z6841 Body Mass Index (BMI) 40.0 and over, adult: Secondary | ICD-10-CM

## 2021-12-05 MED ORDER — VITAMIN D (ERGOCALCIFEROL) 1.25 MG (50000 UNIT) PO CAPS: 50000.0000 [IU] | ORAL_CAPSULE | ORAL | 0 refills | Status: DC

## 2021-12-06 ENCOUNTER — Ambulatory Visit (INDEPENDENT_AMBULATORY_CARE_PROVIDER_SITE_OTHER): Payer: Medicare Other | Admitting: Bariatrics

## 2021-12-13 DIAGNOSIS — E291 Testicular hypofunction: Secondary | ICD-10-CM | POA: Diagnosis not present

## 2021-12-13 NOTE — Progress Notes (Unsigned)
Chief Complaint:   OBESITY Benjamin Shepard is here to discuss his progress with his obesity treatment plan along with follow-up of his obesity related diagnoses. Benjamin Shepard is on the Category 4 Plan and states he is following his eating plan approximately 60% of the time. Benjamin Shepard states he is doing 0 minutes 0 times per week.  Today's visit was #: 45 Starting weight: 342 lbs Starting date: 09/15/2019 Today's weight: 333 lbs Today's date: 12/05/2021 Total lbs lost to date: 9 Total lbs lost since last in-office visit: 0  Interim History: Benjamin Shepard is up 3 pounds since his last visit.  Subjective:   1. Vitamin D deficiency Benjamin Shepard is taking prescription vitamin D.  2. Low HDL (under 40) Benjamin Shepard is currently taking Lipitor.  Assessment/Plan:   1. Vitamin D deficiency Benjamin Shepard will continue prescription vitamin D 50,000 units once weekly, and we will refill for 90 days.  - Vitamin D, Ergocalciferol, (DRISDOL) 1.25 MG (50000 UNIT) CAPS capsule; Take 1 capsule (50,000 Units total) by mouth every 7 (seven) days.  Dispense: 12 capsule; Refill: 0  2. Low HDL (under 40) Benjamin Shepard will continue Lipitor as directed.  3. Obesity, current BMI 47.9 Benjamin Shepard is currently in the action stage of change. As such, his goal is to continue with weight loss efforts. He has agreed to the Category 4 Plan.   Meal planning and intentional eating were discussed.  Increase protein.  Exercise goals: No exercise has been prescribed at this time.  Behavioral modification strategies: increasing lean protein intake, decreasing simple carbohydrates, increasing vegetables, increasing water intake, decreasing eating out, no skipping meals, meal planning and cooking strategies, keeping healthy foods in the home, and planning for success.  Benjamin Shepard has agreed to follow-up with our clinic in 4 weeks. He was informed of the importance of frequent follow-up visits to maximize his success with intensive lifestyle modifications for his  multiple health conditions.   Objective:   Blood pressure (!) 152/71, pulse 67, temperature 97.8 F (36.6 C), height '5\' 10"'$  (1.778 m), weight (!) 333 lb (151 kg), SpO2 96 %. Body mass index is 47.78 kg/m.  General: Cooperative, alert, well developed, in no acute distress. HEENT: Conjunctivae and lids unremarkable. Cardiovascular: Regular rhythm.  Lungs: Normal work of breathing. Neurologic: No focal deficits.   Lab Results  Component Value Date   CREATININE 0.92 08/30/2021   BUN 17 08/30/2021   NA 140 08/30/2021   K 4.2 08/30/2021   CL 99 08/30/2021   CO2 26 08/30/2021   Lab Results  Component Value Date   ALT 23 08/30/2021   AST 19 08/30/2021   ALKPHOS 103 08/30/2021   BILITOT 0.6 08/30/2021   Lab Results  Component Value Date   HGBA1C 5.6 10/17/2020   HGBA1C 5.6 03/13/2020   HGBA1C 6.2 (H) 09/15/2019   HGBA1C 5.9 (H) 05/22/2015   Lab Results  Component Value Date   INSULIN 20.3 08/30/2021   INSULIN 17.8 10/17/2020   INSULIN 23.3 09/15/2019   Lab Results  Component Value Date   TSH 2.570 09/15/2019   Lab Results  Component Value Date   CHOL 119 08/30/2021   HDL 38 (L) 08/30/2021   LDLCALC 68 08/30/2021   TRIG 59 08/30/2021   Lab Results  Component Value Date   VD25OH 42.8 08/30/2021   VD25OH 56.4 10/17/2020   VD25OH 55.6 03/13/2020   Lab Results  Component Value Date   WBC 7.6 05/25/2021   HGB 14.2 05/25/2021   HCT 42.8 05/25/2021  MCV 91 05/25/2021   PLT 201 05/25/2021   No results found for: "IRON", "TIBC", "FERRITIN"  Attestation Statements:   Reviewed by clinician on day of visit: allergies, medications, problem list, medical history, surgical history, family history, social history, and previous encounter notes.   Wilhemena Durie, am acting as Location manager for [employer], DO.  I have reviewed the above documentation for accuracy and completeness, and I agree with the above. Jearld Lesch, DO

## 2021-12-16 NOTE — Progress Notes (Unsigned)
Cardiology Office Note:    Date:  12/17/2021   ID:  Cleda Daub, DOB 1946/01/03, MRN [id number]  PCP:  Velna Hatchet, MD  Cardiologist:  [name] Grooms, MD   Referring MD: Velna Hatchet, MD   No chief complaint on file.   History of Present Illness:    Benjamin Shepard is a 76 y.o. male with a hx of dyspnea on exertion, type 2 diabetes mellitus, hypertension, hyperlipidemia, morbid obesity, and obstructive sleep apnea.  New atrial fibrillation with controlled ventricular  response and subsequent monitor demonstrated AF burden of 12%.  He feels well, denies lower extremity edema, has occasional shortness of breath, uses CPAP, and denies chest pain.  No bleeding on Eliquis.  Past Medical History:  Diagnosis Date   Anxiety    Back pain    BPH (benign prostatic hyperplasia)    Diabetes (HCC)    Edema    Edema of both lower extremities    Foley catheter in place    Food allergy    Gout    HTN (hypertension)    Hyperlipidemia    Joint pain    OSA (obstructive sleep apnea)    USES C-PAP   Psoriasis    Shortness of breath dyspnea    WITH EXERTION   Urinary retention     Past Surgical History:  Procedure Laterality Date   BACK SURGERY     AGE 61   CARDIAC CATHETERIZATION N/A 04/14/2015   Procedure: Left Heart Cath and Coronary Angiography;  Surgeon: Belva Crome, MD;  Location: [place] [facility];  Service: Cardiovascular;  Laterality: N/A;   ROBOT ASSISTED INGUINAL HERNIA REPAIR Bilateral 05/31/2015   Procedure: ROBOT ASSISTED INGUINAL HERNIA REPAIR;  Surgeon: Alexis Frock, MD;  Location: [facility];  Service: Urology;  Laterality: Bilateral;   XI ROBOTIC ASSISTED SIMPLE PROSTATECTOMY N/A 05/31/2015   Procedure: XI ROBOTIC ASSISTED SIMPLE PROSTATECTOMY;  Surgeon: Alexis Frock, MD;  Location: [facility];  Service: Urology;  Laterality: N/A;    Current Medications: Current Meds  Medication Sig   allopurinol (ZYLOPRIM) 100 MG tablet Take 100 mg by mouth daily.    atorvastatin (LIPITOR) 20 MG tablet Take 20 mg by mouth daily.   colchicine 0.6 MG tablet Take 0.6 mg by mouth daily as needed (gout).    diltiazem (TIAZAC) 360 MG 24 hr capsule Take 1 capsule (360 mg total) by mouth daily.   doxazosin (CARDURA) 8 MG tablet Take 2 tablets (16 mg total) by mouth daily. Please make overdue appt with Dr. Tamala Julian before anymore refills. Thank you 1st attempt   escitalopram (LEXAPRO) 10 MG tablet Take 10 mg by mouth daily.   furosemide (LASIX) 20 MG tablet Take 20 mg by mouth daily as needed (leg swelling).   hydrochlorothiazide (HYDRODIURIL) 25 MG tablet Take 1 tablet (25 mg total) by mouth daily.   Lancets (ONETOUCH ULTRASOFT) lancets Test BS twice a day.  ICS9-250.00   losartan (COZAAR) 100 MG tablet Take 1 tablet (100 mg total) by mouth daily.   metFORMIN (GLUCOPHAGE) 500 MG tablet Take 500 mg by mouth daily with breakfast.    oxyCODONE-acetaminophen (PERCOCET/ROXICET) 5-325 MG tablet Take 1-2 tablets by mouth every 4 to 6 hours as needed for pain.   testosterone cypionate (DEPOTESTOSTERONE CYPIONATE) 200 MG/ML injection every 21 ( twenty-one) days.   Turmeric (QC TUMERIC COMPLEX PO) Take 1,000 mg by mouth daily.   Vitamin D, Ergocalciferol, (DRISDOL) 1.25 MG (50000 UNIT) CAPS capsule Take 1 capsule (50,000 Units total) by  mouth every 7 (seven) days.   [DISCONTINUED] apixaban (ELIQUIS) 5 MG TABS tablet Take 1 tablet (5 mg total) by mouth 2 (two) times daily.     Allergies:   Shrimp [shellfish allergy] and Other   Social History   Socioeconomic History   Marital status: Married    Spouse name: Not on file   Number of children: Not on file   Years of education: Not on file   Highest education level: Not on file  Occupational History   Not on file  Tobacco Use   Smoking status: Former    Types: Cigarettes    Quit date: 03/15/1998    Years since quitting: 23.7   Smokeless tobacco: Never  Substance and Sexual Activity   Alcohol use: Yes    Comment: rare  wine, beer   Drug use: No   Sexual activity: Not on file  Other Topics Concern   Not on file  Social History Narrative   Not on file   Social Determinants of Health   Financial Resource Strain: Not on file  Food Insecurity: Not on file  Transportation Needs: Not on file  Physical Activity: Not on file  Stress: Not on file  Social Connections: Not on file     Family History: The patient's family history includes Anxiety disorder in his mother; Depression in his mother; Heart Problems in his mother; Heart disease in his brother, brother, and mother; Heart failure in his mother; High blood pressure in his mother; Hypertension in his father. There is no history of Colon cancer.  ROS:   Please see the history of present illness.    Sedentary.  All other systems reviewed and are negative.  EKGs/Labs/Other Studies Reviewed:    The following studies were reviewed today: CONTINUOUS MONITOR 06/05/2021: Highlights    Paroxysmal atrial fibrillation with 12% burden and longest continuous episode lasting 5 hours and 5 minutes. Heart rate range 35 to 129 bpm. Basic underlying rhythm otherwise sinus rhythm and sinus bradycardia with heart rate range as noted above. Brief SVT versus short runs of A-fib. Ventricular ectopy less than 1% burden.     Patch Wear Time:  3 days and 0 hours (2023-02-24T12:27:16-0500 to 2023-02-27T12:51:58-0500)     ECHOCARDIOGRAPHY 2023: IMPRESSIONS   1. Left ventricular ejection fraction, by estimation, is 60 to 65%. The  left ventricle has normal function. The left ventricle has no regional  wall motion abnormalities. There is mild concentric left ventricular  hypertrophy. Left ventricular diastolic  parameters were normal.   2. Right ventricular systolic function is normal. The right ventricular  size is mildly enlarged. Tricuspid regurgitation signal is inadequate for  assessing PA pressure.   3. The mitral valve is normal in structure. No evidence of  mitral valve  regurgitation. No evidence of mitral stenosis.   4. The aortic valve was not well visualized. Aortic valve regurgitation  is not visualized. No aortic stenosis is present. Aortic valve area, by  VTI measures 4.41 cm. Aortic valve mean gradient measures 8.0 mmHg.  Aortic valve Vmax measures 1.90 m/s.   5. Aortic dilatation noted. Aneurysm of the aortic arch, measuring 47 mm.  Aneurysm of the ascending aorta, measuring 48 mm.   6. The inferior vena cava is normal in size with greater than 50%  respiratory variability, suggesting right atrial pressure of 3 mmHg.   EKG:  EKG not repeated today.  Recent Labs: 05/25/2021: Hemoglobin 14.2; NT-Pro BNP 422; Platelets 201 08/30/2021: ALT 23; BUN 17; Creatinine, Ser  0.92; Potassium 4.2; Sodium 140  Recent Lipid Panel    Component Value Date/Time   CHOL 119 08/30/2021 1107   TRIG 59 08/30/2021 1107   HDL 38 (L) 08/30/2021 1107   LDLCALC 68 08/30/2021 1107    Physical Exam:    VS:  BP (!) 138/58   Pulse 74   Ht '5\' 10"'$  (1.778 m)   Wt (!) 340 lb 9.6 oz (154.5 kg)   SpO2 95%   BMI 48.87 kg/m     Wt Readings from Last 3 Encounters:  12/17/21 (!) 340 lb 9.6 oz (154.5 kg)  12/05/21 (!) 333 lb (151 kg)  11/01/21 (!) 330 lb (149.7 kg)     GEN: Morbidly obese with BMI near 50. No acute distress HEENT: Normal NECK: No JVD. LYMPHATICS: No lymphadenopathy CARDIAC: Left lower sternal systolic murmur. RRR no gallop, or edema. VASCULAR:  Normal Pulses. No bruits. RESPIRATORY:  Clear to auscultation without rales, wheezing or rhonchi  ABDOMEN: Soft, non-tender, non-distended, No pulsatile mass, MUSCULOSKELETAL: No deformity  SKIN: Warm and dry NEUROLOGIC:  Alert and oriented x 3 PSYCHIATRIC:  Normal affect   ASSESSMENT:    1. Aneurysm of ascending aorta without rupture (HCC)   2. Persistent atrial fibrillation ([place])   3. Essential hypertension   4. Anticoagulation adequate   5. OSA (obstructive sleep apnea)   6. Type 2  diabetes mellitus with other circulatory complication, with long-term current use of insulin (HCC)    PLAN:    In order of problems listed above:  October CT will be performed to reevaluate size of aorta.  Identified to be present approximately 6 months ago on echocardiography.  CT will be more accurate.  Will probably need to have comanagement with T CTS. In sinus rhythm today. Blood pressure control is adequate.  Continue HydroDIURIL, Cozaar, and Teah Zach.  Not on beta-blocker therapy.  May need to transition to beta-blocker from diltiazem.  Diltiazem plus beta-blocker will cause excessive bradycardia. Continue Eliquis 5 mg twice daily. Continue to wear CPAP. If shortness of breath becomes an increasing issue, consider SGLT2 therapy although diastolic function on his echo was "normal".   Medication Adjustments/Labs and Tests Ordered: Current medicines are reviewed at length with the patient today.  Concerns regarding medicines are outlined above.  No orders of the defined types were placed in this encounter.  Meds ordered this encounter  Medications   apixaban (ELIQUIS) 5 MG TABS tablet    Sig: Take 1 tablet (5 mg total) by mouth 2 (two) times daily.    Dispense:  180 tablet    Refill:  3    Patient Instructions  Medication Instructions:  Your physician recommends that you continue on your current medications as directed. Please refer to the Current Medication list given to you today.  *If you need a refill on your cardiac medications before your next appointment, please call your pharmacy*  Lab Work: NONE  Testing/Procedures: NONE  Follow-Up: At [facility], you and your health needs are our priority.  As part of our continuing mission to provide you with exceptional heart care, we have created designated Provider Care Teams.  These Care Teams include your primary Cardiologist (physician) and Advanced Practice Providers (APPs -  Physician Assistants and Nurse  Practitioners) who all work together to provide you with the care you need, when you need it.  Your next appointment:   6 month(s)  Please call our office at [phone] in January 2024 to schedule an appointment with  one of our cardiologists for your follow-up visit due in March 2024.  The format for your next appointment:   In Person  Provider:   [name] Grooms, MD   Important Information About Sugar         Signed, [name] Grooms, MD  12/17/2021 3:31 PM    [place]

## 2021-12-17 ENCOUNTER — Encounter (INDEPENDENT_AMBULATORY_CARE_PROVIDER_SITE_OTHER): Payer: Self-pay | Admitting: Bariatrics

## 2021-12-17 ENCOUNTER — Ambulatory Visit: Payer: Medicare Other | Attending: Interventional Cardiology | Admitting: Interventional Cardiology

## 2021-12-17 ENCOUNTER — Encounter: Payer: Self-pay | Admitting: Interventional Cardiology

## 2021-12-17 VITALS — BP 138/58 | HR 74 | Ht 70.0 in | Wt 340.6 lb

## 2021-12-17 DIAGNOSIS — E1159 Type 2 diabetes mellitus with other circulatory complications: Secondary | ICD-10-CM | POA: Insufficient documentation

## 2021-12-17 DIAGNOSIS — I7121 Aneurysm of the ascending aorta, without rupture: Secondary | ICD-10-CM | POA: Insufficient documentation

## 2021-12-17 DIAGNOSIS — Z7901 Long term (current) use of anticoagulants: Secondary | ICD-10-CM | POA: Diagnosis not present

## 2021-12-17 DIAGNOSIS — G4733 Obstructive sleep apnea (adult) (pediatric): Secondary | ICD-10-CM | POA: Insufficient documentation

## 2021-12-17 DIAGNOSIS — Z794 Long term (current) use of insulin: Secondary | ICD-10-CM | POA: Insufficient documentation

## 2021-12-17 DIAGNOSIS — I10 Essential (primary) hypertension: Secondary | ICD-10-CM | POA: Insufficient documentation

## 2021-12-17 DIAGNOSIS — I4819 Other persistent atrial fibrillation: Secondary | ICD-10-CM | POA: Insufficient documentation

## 2021-12-17 MED ORDER — APIXABAN 5 MG PO TABS: 5.0000 mg | ORAL_TABLET | Freq: Two times a day (BID) | ORAL | 3 refills | Status: DC

## 2021-12-17 NOTE — Patient Instructions (Signed)
Medication Instructions:  Your physician recommends that you continue on your current medications as directed. Please refer to the Current Medication list given to you today.  *If you need a refill on your cardiac medications before your next appointment, please call your pharmacy*  Lab Work: NONE  Testing/Procedures: NONE  Follow-Up: At [facility], you and your health needs are our priority.  As part of our continuing mission to provide you with exceptional heart care, we have created designated Provider Care Teams.  These Care Teams include your primary Cardiologist (physician) and Advanced Practice Providers (APPs -  Physician Assistants and Nurse Practitioners) who all work together to provide you with the care you need, when you need it.  Your next appointment:   6 month(s)  Please call our office at [phone] in January 2024 to schedule an appointment with one of our cardiologists for your follow-up visit due in March 2024.  The format for your next appointment:   In Person  Provider:   [name] Grooms, MD   Important Information About Sugar

## 2021-12-24 DIAGNOSIS — E291 Testicular hypofunction: Secondary | ICD-10-CM | POA: Diagnosis not present

## 2021-12-31 DIAGNOSIS — E785 Hyperlipidemia, unspecified: Secondary | ICD-10-CM | POA: Diagnosis not present

## 2021-12-31 DIAGNOSIS — R7989 Other specified abnormal findings of blood chemistry: Secondary | ICD-10-CM | POA: Diagnosis not present

## 2021-12-31 DIAGNOSIS — Z125 Encounter for screening for malignant neoplasm of prostate: Secondary | ICD-10-CM | POA: Diagnosis not present

## 2021-12-31 DIAGNOSIS — I10 Essential (primary) hypertension: Secondary | ICD-10-CM | POA: Diagnosis not present

## 2021-12-31 DIAGNOSIS — M109 Gout, unspecified: Secondary | ICD-10-CM | POA: Diagnosis not present

## 2021-12-31 DIAGNOSIS — R7303 Prediabetes: Secondary | ICD-10-CM | POA: Diagnosis not present

## 2022-01-02 ENCOUNTER — Ambulatory Visit
Admission: RE | Admit: 2022-01-02 | Discharge: 2022-01-02 | Disposition: A | Discharge status: Home / Self Care | Payer: Medicare Other | Source: Ambulatory Visit | Attending: Interventional Cardiology | Admitting: Interventional Cardiology

## 2022-01-02 DIAGNOSIS — R911 Solitary pulmonary nodule: Secondary | ICD-10-CM | POA: Diagnosis not present

## 2022-01-02 DIAGNOSIS — I712 Thoracic aortic aneurysm, without rupture, unspecified: Secondary | ICD-10-CM | POA: Diagnosis not present

## 2022-01-02 DIAGNOSIS — I7121 Aneurysm of the ascending aorta, without rupture: Secondary | ICD-10-CM

## 2022-01-02 MED ORDER — IOPAMIDOL (ISOVUE-370) INJECTION 76%
75.0000 mL | Freq: Once | INTRAVENOUS | Status: AC | PRN
  Administered 2022-01-02: 75 mL via INTRAVENOUS

## 2022-01-03 ENCOUNTER — Telehealth: Payer: Self-pay

## 2022-01-03 DIAGNOSIS — E291 Testicular hypofunction: Secondary | ICD-10-CM | POA: Diagnosis not present

## 2022-01-03 DIAGNOSIS — I7121 Aneurysm of the ascending aorta, without rupture: Secondary | ICD-10-CM

## 2022-01-03 NOTE — Telephone Encounter (Signed)
Spoke with patient and discussed results of CT scan.  Per Dr. Tamala Julian: Let the patient know the aortic aneurysm size is stable measuring 4.6 cm.  In 6 months he needs a MRA of the aorta to follow-up   MR Angio Chest ordered, scheduler will call patient to make appt.  Patient verbalized understanding of the above and expressed appreciation for call.

## 2022-01-03 NOTE — Telephone Encounter (Signed)
-----   Message from Belva Crome, MD sent at 01/02/2022  6:34 PM EDT ----- Let the patient know the aortic aneurysm size is stable measuring 4.6 cm.  In 6 months he needs a MRA of the aorta to follow-up A copy will be sent to Stacie Glaze, DO

## 2022-01-07 DIAGNOSIS — E785 Hyperlipidemia, unspecified: Secondary | ICD-10-CM | POA: Diagnosis not present

## 2022-01-07 DIAGNOSIS — I10 Essential (primary) hypertension: Secondary | ICD-10-CM | POA: Diagnosis not present

## 2022-01-07 DIAGNOSIS — M109 Gout, unspecified: Secondary | ICD-10-CM | POA: Diagnosis not present

## 2022-01-07 DIAGNOSIS — Z23 Encounter for immunization: Secondary | ICD-10-CM | POA: Diagnosis not present

## 2022-01-07 DIAGNOSIS — D692 Other nonthrombocytopenic purpura: Secondary | ICD-10-CM | POA: Diagnosis not present

## 2022-01-07 DIAGNOSIS — I714 Abdominal aortic aneurysm, without rupture, unspecified: Secondary | ICD-10-CM | POA: Diagnosis not present

## 2022-01-07 DIAGNOSIS — E1121 Type 2 diabetes mellitus with diabetic nephropathy: Secondary | ICD-10-CM | POA: Diagnosis not present

## 2022-01-07 DIAGNOSIS — Z0000 Encounter for general adult medical examination without abnormal findings: Secondary | ICD-10-CM | POA: Diagnosis not present

## 2022-01-07 DIAGNOSIS — I4891 Unspecified atrial fibrillation: Secondary | ICD-10-CM | POA: Diagnosis not present

## 2022-01-07 DIAGNOSIS — E291 Testicular hypofunction: Secondary | ICD-10-CM | POA: Diagnosis not present

## 2022-01-07 DIAGNOSIS — D6859 Other primary thrombophilia: Secondary | ICD-10-CM | POA: Diagnosis not present

## 2022-01-07 DIAGNOSIS — R972 Elevated prostate specific antigen [PSA]: Secondary | ICD-10-CM | POA: Diagnosis not present

## 2022-01-24 DIAGNOSIS — E291 Testicular hypofunction: Secondary | ICD-10-CM | POA: Diagnosis not present

## 2022-02-14 DIAGNOSIS — E291 Testicular hypofunction: Secondary | ICD-10-CM | POA: Diagnosis not present

## 2022-03-04 ENCOUNTER — Encounter: Payer: Self-pay | Admitting: Podiatry

## 2022-03-04 ENCOUNTER — Ambulatory Visit (INDEPENDENT_AMBULATORY_CARE_PROVIDER_SITE_OTHER): Payer: Medicare Other | Admitting: Podiatry

## 2022-03-04 VITALS — BP 137/59 | HR 59

## 2022-03-04 DIAGNOSIS — M79675 Pain in left toe(s): Secondary | ICD-10-CM | POA: Diagnosis not present

## 2022-03-04 DIAGNOSIS — B351 Tinea unguium: Secondary | ICD-10-CM

## 2022-03-04 DIAGNOSIS — M79674 Pain in right toe(s): Secondary | ICD-10-CM | POA: Diagnosis not present

## 2022-03-04 DIAGNOSIS — E119 Type 2 diabetes mellitus without complications: Secondary | ICD-10-CM | POA: Diagnosis not present

## 2022-03-04 NOTE — Progress Notes (Signed)
This patient returns to my office for at risk foot care.  This patient requires this care by a professional since this patient will be at risk due to having  diabetes.This patient is unable to cut nails themselves since the patient cannot reach their nails.These nails are painful walking and wearing shoes.  This patient presents for at risk foot care today.  General Appearance  Alert, conversant and in no acute stress.  Vascular  Dorsalis pedis  are palpable  bilaterally. Posterior tibial pulses are not palpable. Capillary return is within normal limits  bilaterally. Temperature is within normal limits  bilaterally.  Neurologic  Senn-Weinstein monofilament wire test within normal limits  bilaterally. Muscle power within normal limits bilaterally.  Nails Thick disfigured discolored nails with subungual debris  from hallux to fifth toes bilaterally. No evidence of bacterial infection or drainage bilaterally.  Orthopedic  No limitations of motion  feet .  No crepitus or effusions noted.  No bony pathology or digital deformities noted.  Mild  HAV  B/L.  Skin  normotropic skin with no porokeratosis noted bilaterally.  No signs of infections or ulcers noted.     Onychomycosis  Pain in right toe  Pain in left toe.  Consent was obtained for treatment procedures.  Debridement and grinding of long thick nails with clearing of subungual debris.  No infection or ulcer.     Return office visit  3 months        Told patient to return for periodic foot care and evaluation due to potential at risk complications.   Taisei Bonnette DPM  

## 2022-03-07 DIAGNOSIS — E291 Testicular hypofunction: Secondary | ICD-10-CM | POA: Diagnosis not present

## 2022-03-24 ENCOUNTER — Other Ambulatory Visit (INDEPENDENT_AMBULATORY_CARE_PROVIDER_SITE_OTHER): Payer: Self-pay | Admitting: Bariatrics

## 2022-03-24 DIAGNOSIS — E559 Vitamin D deficiency, unspecified: Secondary | ICD-10-CM

## 2022-03-28 DIAGNOSIS — E291 Testicular hypofunction: Secondary | ICD-10-CM | POA: Diagnosis not present

## 2022-04-18 DIAGNOSIS — E291 Testicular hypofunction: Secondary | ICD-10-CM | POA: Diagnosis not present

## 2022-05-09 DIAGNOSIS — E291 Testicular hypofunction: Secondary | ICD-10-CM | POA: Diagnosis not present

## 2022-05-30 DIAGNOSIS — E291 Testicular hypofunction: Secondary | ICD-10-CM | POA: Diagnosis not present

## 2022-06-05 ENCOUNTER — Encounter: Payer: Self-pay | Admitting: Podiatry

## 2022-06-05 ENCOUNTER — Ambulatory Visit (INDEPENDENT_AMBULATORY_CARE_PROVIDER_SITE_OTHER): Payer: Medicare Other | Admitting: Podiatry

## 2022-06-05 DIAGNOSIS — M79674 Pain in right toe(s): Secondary | ICD-10-CM

## 2022-06-05 DIAGNOSIS — E119 Type 2 diabetes mellitus without complications: Secondary | ICD-10-CM | POA: Diagnosis not present

## 2022-06-05 DIAGNOSIS — B351 Tinea unguium: Secondary | ICD-10-CM

## 2022-06-05 DIAGNOSIS — M79675 Pain in left toe(s): Secondary | ICD-10-CM

## 2022-06-05 NOTE — Progress Notes (Signed)
This patient returns to my office for at risk foot care.  This patient requires this care by a professional since this patient will be at risk due to having  diabetes.This patient is unable to cut nails themselves since the patient cannot reach their nails.These nails are painful walking and wearing shoes.  This patient presents for at risk foot care today.  General Appearance  Alert, conversant and in no acute stress.  Vascular  Dorsalis pedis  are palpable  bilaterally. Posterior tibial pulses are not palpable. Capillary return is within normal limits  bilaterally. Temperature is within normal limits  bilaterally.  Neurologic  Senn-Weinstein monofilament wire test within normal limits  bilaterally. Muscle power within normal limits bilaterally.  Nails Thick disfigured discolored nails with subungual debris  from hallux to fifth toes bilaterally. No evidence of bacterial infection or drainage bilaterally.  Orthopedic  No limitations of motion  feet .  No crepitus or effusions noted.  No bony pathology or digital deformities noted.  Mild  HAV  B/L.  Skin  normotropic skin with no porokeratosis noted bilaterally.  No signs of infections or ulcers noted.     Onychomycosis  Pain in right toe  Pain in left toe.  Consent was obtained for treatment procedures.  Debridement and grinding of long thick nails with clearing of subungual debris.  No infection or ulcer.     Return office visit  3 months        Told patient to return for periodic foot care and evaluation due to potential at risk complications.   Gardiner Barefoot DPM

## 2022-06-11 ENCOUNTER — Other Ambulatory Visit: Payer: Self-pay | Admitting: *Deleted

## 2022-06-11 ENCOUNTER — Other Ambulatory Visit: Payer: Self-pay

## 2022-06-11 MED ORDER — HYDROCHLOROTHIAZIDE 25 MG PO TABS: 25.0000 mg | ORAL_TABLET | Freq: Every day | ORAL | 3 refills | Status: DC

## 2022-06-11 MED ORDER — LOSARTAN POTASSIUM 100 MG PO TABS: 100.0000 mg | ORAL_TABLET | Freq: Every day | ORAL | 3 refills | Status: DC

## 2022-06-11 MED ORDER — DOXAZOSIN MESYLATE 8 MG PO TABS: 16.0000 mg | ORAL_TABLET | Freq: Every day | ORAL | 1 refills | Status: DC

## 2022-06-11 MED ORDER — DILTIAZEM HCL ER BEADS 360 MG PO CP24: 360.0000 mg | ORAL_CAPSULE | Freq: Every day | ORAL | 3 refills | Status: DC

## 2022-06-17 ENCOUNTER — Ambulatory Visit: Payer: Medicare Other | Attending: Physician Assistant | Admitting: Physician Assistant

## 2022-06-17 ENCOUNTER — Encounter: Payer: Self-pay | Admitting: Physician Assistant

## 2022-06-17 VITALS — BP 130/58 | HR 61 | Ht 70.0 in | Wt 338.0 lb

## 2022-06-17 DIAGNOSIS — G4733 Obstructive sleep apnea (adult) (pediatric): Secondary | ICD-10-CM

## 2022-06-17 DIAGNOSIS — E1159 Type 2 diabetes mellitus with other circulatory complications: Secondary | ICD-10-CM | POA: Insufficient documentation

## 2022-06-17 DIAGNOSIS — R0609 Other forms of dyspnea: Secondary | ICD-10-CM | POA: Diagnosis not present

## 2022-06-17 DIAGNOSIS — Z794 Long term (current) use of insulin: Secondary | ICD-10-CM | POA: Insufficient documentation

## 2022-06-17 DIAGNOSIS — I5189 Other ill-defined heart diseases: Secondary | ICD-10-CM | POA: Insufficient documentation

## 2022-06-17 DIAGNOSIS — I4891 Unspecified atrial fibrillation: Secondary | ICD-10-CM | POA: Insufficient documentation

## 2022-06-17 DIAGNOSIS — I7121 Aneurysm of the ascending aorta, without rupture: Secondary | ICD-10-CM | POA: Diagnosis not present

## 2022-06-17 NOTE — Progress Notes (Signed)
Office Visit    Patient Name: Benjamin Shepard Date of Encounter: 06/17/2022  PCP:  Velna Hatchet, [place]  Cardiologist:  Werner Lean, MD  Advanced Practice Provider:  No care team member to display Electrophysiologist:  None   HPI    Benjamin Shepard is a 77 y.o. male with a past medical history of dyspnea on exertion, type 2 diabetes mellitus, hypertension, hyperlipidemia, morbid obesity, and OSA.  New atrial fibrillation with controlled ventricular response and subsequent monitor demonstrated AF, burden 12% presents today for follow-up appointment.  He was last seen by Dr. Tamala Julian 11/2021 and at that time felt well and did not have any lower extremity edema.  Occasional shortness of breath.  Use CPAP.  Denied chest pain.  No bleeding on Eliquis.  Today, he tells me that he has some shortness of breath but no chest pain.  Shortness of breath has been chronic.  Most recent echo radiogram reviewed without any systolic or diastolic dysfunction.  He tries to get in some activity when he can.  He stays away from sodium.  He does have some off-and-on swelling of his lower legs however, he had some Lasix to take when this happens.  I also recommended [name] use if he is going to be more particularly active next day.  Otherwise, blood pressure is well-controlled as well as heart rate.  Stable from a cardiac standpoint.  Reports no shortness of breath nor dyspnea on exertion. Reports no chest pain, pressure, or tightness. No orthopnea, PND. Reports no palpitations.    Past Medical History    Past Medical History:  Diagnosis Date   Anxiety    Back pain    BPH (benign prostatic hyperplasia)    Diabetes (HCC)    Edema    Edema of both lower extremities    Foley catheter in place    Food allergy    Gout    HTN (hypertension)    Hyperlipidemia    Joint pain    OSA (obstructive sleep apnea)    USES C-PAP   Psoriasis    Shortness of  breath dyspnea    WITH EXERTION   Urinary retention    Past Surgical History:  Procedure Laterality Date   BACK SURGERY     AGE 3   CARDIAC CATHETERIZATION N/A 04/14/2015   Procedure: Left Heart Cath and Coronary Angiography;  Surgeon: Belva Crome, MD;  Location: [place] [facility];  Service: Cardiovascular;  Laterality: N/A;   ROBOT ASSISTED INGUINAL HERNIA REPAIR Bilateral 05/31/2015   Procedure: ROBOT ASSISTED INGUINAL HERNIA REPAIR;  Surgeon: Alexis Frock, MD;  Location: [facility];  Service: Urology;  Laterality: Bilateral;   XI ROBOTIC ASSISTED SIMPLE PROSTATECTOMY N/A 05/31/2015   Procedure: XI ROBOTIC ASSISTED SIMPLE PROSTATECTOMY;  Surgeon: Alexis Frock, MD;  Location: [facility];  Service: Urology;  Laterality: N/A;    Allergies  Allergies  Allergen Reactions   Shrimp [Shellfish Allergy]     "throat swelling, SOB"   Other Rash    Muscle relaxer     EKGs/Labs/Other Studies Reviewed:   The following studies were reviewed today:   CONTINUOUS MONITOR 06/05/2021: Highlights     Paroxysmal atrial fibrillation with 12% burden and longest continuous episode lasting 5 hours and 5 minutes. Heart rate range 35 to 129 bpm. Basic underlying rhythm otherwise sinus rhythm and sinus bradycardia with heart rate range as noted above. Brief SVT versus short runs of A-fib. Ventricular ectopy less  than 1% burden.     Patch Wear Time:  3 days and 0 hours (2023-02-24T12:27:16-0500 to 2023-02-27T12:51:58-0500)       ECHOCARDIOGRAPHY 2023: IMPRESSIONS   1. Left ventricular ejection fraction, by estimation, is 60 to 65%. The  left ventricle has normal function. The left ventricle has no regional  wall motion abnormalities. There is mild concentric left ventricular  hypertrophy. Left ventricular diastolic  parameters were normal.   2. Right ventricular systolic function is normal. The right ventricular  size is mildly enlarged. Tricuspid regurgitation signal is inadequate for  assessing  PA pressure.   3. The mitral valve is normal in structure. No evidence of mitral valve  regurgitation. No evidence of mitral stenosis.   4. The aortic valve was not well visualized. Aortic valve regurgitation  is not visualized. No aortic stenosis is present. Aortic valve area, by  VTI measures 4.41 cm. Aortic valve mean gradient measures 8.0 mmHg.  Aortic valve Vmax measures 1.90 m/s.   5. Aortic dilatation noted. Aneurysm of the aortic arch, measuring 47 mm.  Aneurysm of the ascending aorta, measuring 48 mm.   6. The inferior vena cava is normal in size with greater than 50%  respiratory variability, suggesting right atrial pressure of 3 mmHg.     EKG:  EKG is  ordered today.  The ekg ordered today demonstrates normal sinus rhythm, rate 61 bpm, first-degree AV block with occasional PVC  Recent Labs: 08/30/2021: ALT 23; BUN 17; Creatinine, Ser 0.92; Potassium 4.2; Sodium 140  Recent Lipid Panel    Component Value Date/Time   CHOL 119 08/30/2021 1107   TRIG 59 08/30/2021 1107   HDL 38 (L) 08/30/2021 1107   LDLCALC 68 08/30/2021 1107    Home Medications   Current Meds  Medication Sig   allopurinol (ZYLOPRIM) 100 MG tablet Take 100 mg by mouth daily.   apixaban (ELIQUIS) 5 MG TABS tablet Take 1 tablet (5 mg total) by mouth 2 (two) times daily.   atorvastatin (LIPITOR) 20 MG tablet Take 20 mg by mouth daily.   colchicine 0.6 MG tablet Take 0.6 mg by mouth daily as needed (gout).    diltiazem (TIAZAC) 360 MG 24 hr capsule Take 1 capsule (360 mg total) by mouth daily.   doxazosin (CARDURA) 8 MG tablet Take 2 tablets (16 mg total) by mouth daily. Must keep scheduled appointment for future refills. Thank you.   escitalopram (LEXAPRO) 10 MG tablet Take 10 mg by mouth daily.   furosemide (LASIX) 20 MG tablet Take 20 mg by mouth daily as needed (leg swelling).   hydrochlorothiazide (HYDRODIURIL) 25 MG tablet Take 1 tablet (25 mg total) by mouth daily.   Lancets (ONETOUCH ULTRASOFT)  lancets Test BS twice a day.  ICS9-250.00   losartan (COZAAR) 100 MG tablet Take 1 tablet (100 mg total) by mouth daily.   metFORMIN (GLUCOPHAGE) 500 MG tablet Take 500 mg by mouth daily with breakfast.    oxyCODONE-acetaminophen (PERCOCET/ROXICET) 5-325 MG tablet Take 1-2 tablets by mouth every 4 to 6 hours as needed for pain.   testosterone cypionate (DEPOTESTOSTERONE CYPIONATE) 200 MG/ML injection every 21 ( twenty-one) days.   Turmeric (QC TUMERIC COMPLEX PO) Take 1,000 mg by mouth daily.   Vitamin D, Ergocalciferol, (DRISDOL) 1.25 MG (50000 UNIT) CAPS capsule Take 1 capsule (50,000 Units total) by mouth every 7 (seven) days.     Review of Systems      All other systems reviewed and are otherwise negative except as noted above.  Physical Exam    VS:  BP (!) 130/58   Pulse 61   Ht 5\' 10"  (1.778 m)   Wt (!) 338 lb (153.3 kg)   SpO2 96%   BMI 48.50 kg/m  , BMI Body mass index is 48.5 kg/m.  Wt Readings from Last 3 Encounters:  06/17/22 (!) 338 lb (153.3 kg)  12/17/21 (!) 340 lb 9.6 oz (154.5 kg)  12/05/21 (!) 333 lb (151 kg)     GEN: Well nourished, well developed, in no acute distress. HEENT: normal. Neck: Supple, no JVD, carotid bruits, or masses. Cardiac: RRR, no murmurs, rubs, or gallops. No clubbing, cyanosis, 1 + LE edema.  Radials/PT 2+ and equal bilaterally.  Respiratory:  Respirations regular and unlabored, clear to auscultation bilaterally. GI: Soft, nontender, nondistended. MS: No deformity or atrophy. Skin: Warm and dry, no rash. Neuro:  Strength and sensation are intact. Psych: Normal affect.  Assessment & Plan    Aneurysm of ascending aorta without rupture - track BP at home  -well controlled today -CTA yearly, CT surgeon consult  Persistent atrial fibrillation -in and out occasional NSR today rate 60s. -holding BB at this time -Continue Eliquis 5 mg twice daily  Essential hypertension -well controlled  -Continue monitoring at home -Continue  current medication regimen including losartan 100 mg daily, HCTZ 25 mg daily  Anticoagulation -No issues with blood in urine or stool -Continue Eliquis 5 mg twice a day  OSA -CPAP use at home -no issues  Type 2 diabetes mellitus -A1c 5.8 -Well-controlled at this time, continue current medications including metformin 500 mg daily         Disposition: Follow up 6 months with Werner Lean, MD or APP.  Signed, Elgie Collard, PA-C 06/17/2022, 5:19 PM [facility]

## 2022-06-17 NOTE — Patient Instructions (Addendum)
Medication Instructions:  Your physician recommends that you continue on your current medications as directed. Please refer to the Current Medication list given to you today.  *If you need a refill on your cardiac medications before your next appointment, please call your pharmacy*   Lab Work: Fasting lipids and lft's in September/October 2024 If you have labs (blood work) drawn today and your tests are completely normal, you will receive your results only by: [place] (if you have MyChart) OR A paper copy in the mail If you have any lab test that is abnormal or we need to change your treatment, we will call you to review the results.   Follow-Up: At [facility], you and your health needs are our priority.  As part of our continuing mission to provide you with exceptional heart care, we have created designated Provider Care Teams.  These Care Teams include your primary Cardiologist (physician) and Advanced Practice Providers (APPs -  Physician Assistants and Nurse Practitioners) who all work together to provide you with the care you need, when you need it.   Your next appointment:   6 month(s)  Provider:   Dr Gasper Sells  Other Instructions You have been referred to CT surgery.

## 2022-06-18 ENCOUNTER — Other Ambulatory Visit: Payer: Self-pay

## 2022-06-18 DIAGNOSIS — I7121 Aneurysm of the ascending aorta, without rupture: Secondary | ICD-10-CM

## 2022-06-18 NOTE — Progress Notes (Signed)
Order placed for MRA for ascending aortic aneurysm per Nicholes Rough, PA.

## 2022-06-20 DIAGNOSIS — E291 Testicular hypofunction: Secondary | ICD-10-CM | POA: Diagnosis not present

## 2022-07-04 DIAGNOSIS — E291 Testicular hypofunction: Secondary | ICD-10-CM | POA: Diagnosis not present

## 2022-07-17 DIAGNOSIS — I10 Essential (primary) hypertension: Secondary | ICD-10-CM | POA: Diagnosis not present

## 2022-07-17 DIAGNOSIS — M109 Gout, unspecified: Secondary | ICD-10-CM | POA: Diagnosis not present

## 2022-07-17 DIAGNOSIS — D6859 Other primary thrombophilia: Secondary | ICD-10-CM | POA: Diagnosis not present

## 2022-07-17 DIAGNOSIS — E1121 Type 2 diabetes mellitus with diabetic nephropathy: Secondary | ICD-10-CM | POA: Diagnosis not present

## 2022-07-17 DIAGNOSIS — N401 Enlarged prostate with lower urinary tract symptoms: Secondary | ICD-10-CM | POA: Diagnosis not present

## 2022-07-17 DIAGNOSIS — I714 Abdominal aortic aneurysm, without rupture, unspecified: Secondary | ICD-10-CM | POA: Diagnosis not present

## 2022-07-17 DIAGNOSIS — R972 Elevated prostate specific antigen [PSA]: Secondary | ICD-10-CM | POA: Diagnosis not present

## 2022-07-17 DIAGNOSIS — D692 Other nonthrombocytopenic purpura: Secondary | ICD-10-CM | POA: Diagnosis not present

## 2022-07-17 DIAGNOSIS — E785 Hyperlipidemia, unspecified: Secondary | ICD-10-CM | POA: Diagnosis not present

## 2022-07-17 DIAGNOSIS — I4891 Unspecified atrial fibrillation: Secondary | ICD-10-CM | POA: Diagnosis not present

## 2022-07-17 DIAGNOSIS — E291 Testicular hypofunction: Secondary | ICD-10-CM | POA: Diagnosis not present

## 2022-07-25 DIAGNOSIS — E291 Testicular hypofunction: Secondary | ICD-10-CM | POA: Diagnosis not present

## 2022-08-16 ENCOUNTER — Encounter: Payer: Self-pay | Admitting: Pulmonary Disease

## 2022-08-16 ENCOUNTER — Ambulatory Visit (INDEPENDENT_AMBULATORY_CARE_PROVIDER_SITE_OTHER): Payer: Medicare Other | Admitting: Pulmonary Disease

## 2022-08-16 VITALS — BP 136/64 | HR 70 | Ht 70.0 in | Wt 320.0 lb

## 2022-08-16 DIAGNOSIS — R0609 Other forms of dyspnea: Secondary | ICD-10-CM

## 2022-08-16 MED ORDER — STIOLTO RESPIMAT 2.5-2.5 MCG/ACT IN AERS: 2.0000 | INHALATION_SPRAY | Freq: Every day | RESPIRATORY_TRACT | 0 refills | Status: DC

## 2022-08-16 NOTE — Progress Notes (Signed)
@Patient  ID: Benjamin Shepard, male    DOB: Oct 15, 1945, 77 y.o.   MRN: [id number]  Chief Complaint  Patient presents with   Consult    Referred by PCP for increased SOB over the past few months. Denies any increased coughing.     Referring provider: Alysia Penna, MD  HPI:   77 y.o. man whom we are seeing for evaluation of dyspnea on exertion.  Multiple cardiology note reviewed.  Most recent PCP note reviewed.  Patient notes shortness of breath for some time now.  Months to years.  May be slightly progressing over time.  Worse on inclines or stairs but even on flat surfaces over a short distance he becomes so winded need to sit, rest.  With rest things improved.  No time of day when things are better or worse.  No position to make things better or worse.  Maybe a little worse with seasonal changes he thinks, heavy or hot air.  No other alleviating or exacerbating factors.  Not using inhalers.  Chest imaging is clear, CT scan 12/2021 personally reviewed interpret as clear lungs bilaterally, it is over timeframe of similar symptoms.  Scan 06/2021 also personally reviewed interpret as clear lungs bilaterally.  In 2017 he had a left heart catheterization that revealed no significant coronary disease but did have a LVEDP of 19.  He has atrial fibrillation.  On diltiazem for rate control.  A-fib was described as paroxysmal.  TTE in 2023 relatively reassuring.    Questionaires / Pulmonary Flowsheets:   ACT:      No data to display          MMRC:     No data to display          Epworth:      No data to display          Tests:   FENO:  No results found for: "NITRICOXIDE"  PFT:     No data to display          WALK:      No data to display          Imaging: Personally reviewed and as per EMR discussion this note No results found.  Lab Results: Personally reviewed CBC    Component Value Date/Time   WBC 7.6 05/25/2021 1245   WBC 7.4 05/22/2015 1350    RBC 4.71 05/25/2021 1245   RBC 4.47 05/22/2015 1350   HGB 14.2 05/25/2021 1245   HCT 42.8 05/25/2021 1245   PLT 201 05/25/2021 1245   MCV 91 05/25/2021 1245   MCH 30.1 05/25/2021 1245   MCH 30.2 05/22/2015 1350   MCHC 33.2 05/25/2021 1245   MCHC 32.4 05/22/2015 1350   RDW 12.4 05/25/2021 1245   LYMPHSABS 1.4 04/12/2015 1328   MONOABS 0.5 04/12/2015 1328   EOSABS 0.0 04/12/2015 1328   BASOSABS 0.0 04/12/2015 1328    BMET    Component Value Date/Time   NA 140 08/30/2021 1107   K 4.2 08/30/2021 1107   CL 99 08/30/2021 1107   CO2 26 08/30/2021 1107   GLUCOSE 126 (H) 08/30/2021 1107   GLUCOSE 111 (H) 06/01/2015 0440   BUN 17 08/30/2021 1107   CREATININE 0.92 08/30/2021 1107   CREATININE 0.84 04/12/2015 1328   CALCIUM 9.2 08/30/2021 1107   GFRNONAA 83 03/13/2020 1002   GFRAA 96 03/13/2020 1002    BNP No results found for: "BNP"  ProBNP    Component Value Date/Time   PROBNP 422  05/25/2021 1245    Specialty Problems       Pulmonary Problems   OSA (obstructive sleep apnea)   Dyspnea    Allergies  Allergen Reactions   Shrimp [Shellfish Allergy]     "throat swelling, SOB"   Other Rash    Muscle relaxer    Immunization History  Administered Date(s) Administered   Influenza Split 02/04/2013, 02/08/2014   Influenza, High Dose Seasonal PF 02/08/2014, 02/05/2016   Influenza, Quadrivalent, Recombinant, Inj, Pf 02/18/2018, 12/04/2018, 12/22/2019, 01/01/2021   Pneumococcal Conjugate-13 05/26/2013, 12/08/2013, 07/06/2015   Td 07/10/2016   Tdap 05/26/2013   Zoster, Live 04/02/2011    Past Medical History:  Diagnosis Date   Anxiety    Back pain    BPH (benign prostatic hyperplasia)    Diabetes (HCC)    Edema    Edema of both lower extremities    Foley catheter in place    Food allergy    Gout    HTN (hypertension)    Hyperlipidemia    Joint pain    OSA (obstructive sleep apnea)    USES C-PAP   Psoriasis    Shortness of breath dyspnea    WITH  EXERTION   Urinary retention     Tobacco History: Social History   Tobacco Use  Smoking Status Former   Types: Cigarettes   Quit date: 03/15/1998   Years since quitting: 24.4  Smokeless Tobacco Never   Counseling given: Not Answered   Continue to not smoke  Outpatient Encounter Medications as of 08/16/2022  Medication Sig   allopurinol (ZYLOPRIM) 100 MG tablet Take 100 mg by mouth daily.   apixaban (ELIQUIS) 5 MG TABS tablet Take 1 tablet (5 mg total) by mouth 2 (two) times daily.   atorvastatin (LIPITOR) 20 MG tablet Take 20 mg by mouth daily.   colchicine 0.6 MG tablet Take 0.6 mg by mouth daily as needed (gout).    diltiazem (TIAZAC) 360 MG 24 hr capsule Take 1 capsule (360 mg total) by mouth daily.   doxazosin (CARDURA) 8 MG tablet Take 2 tablets (16 mg total) by mouth daily. Must keep scheduled appointment for future refills. Thank you.   escitalopram (LEXAPRO) 10 MG tablet Take 10 mg by mouth daily.   furosemide (LASIX) 20 MG tablet Take 20 mg by mouth daily as needed (leg swelling).   hydrochlorothiazide (HYDRODIURIL) 25 MG tablet Take 1 tablet (25 mg total) by mouth daily.   Lancets (ONETOUCH ULTRASOFT) lancets Test BS twice a day.  ICS9-250.00   losartan (COZAAR) 100 MG tablet Take 1 tablet (100 mg total) by mouth daily.   metFORMIN (GLUCOPHAGE) 500 MG tablet Take 500 mg by mouth daily with breakfast.    oxyCODONE-acetaminophen (PERCOCET/ROXICET) 5-325 MG tablet Take 1-2 tablets by mouth every 4 to 6 hours as needed for pain.   testosterone cypionate (DEPOTESTOSTERONE CYPIONATE) 200 MG/ML injection every 21 ( twenty-one) days.   Turmeric (QC TUMERIC COMPLEX PO) Take 1,000 mg by mouth daily.   Vitamin D, Ergocalciferol, (DRISDOL) 1.25 MG (50000 UNIT) CAPS capsule Take 1 capsule (50,000 Units total) by mouth every 7 (seven) days.   No facility-administered encounter medications on file as of 08/16/2022.     Review of Systems  Review of Systems  No chest pain with  exertion.  No orthopnea or PND.  Comprehensive review of systems otherwise negative. Physical Exam  BP 136/64   Pulse 70   Ht 5\' 10"  (1.778 m)   Wt (!) 320 lb (145.2 kg)  SpO2 98% Comment: on RA  BMI 45.92 kg/m   Wt Readings from Last 5 Encounters:  08/16/22 (!) 320 lb (145.2 kg)  06/17/22 (!) 338 lb (153.3 kg)  12/17/21 (!) 340 lb 9.6 oz (154.5 kg)  12/05/21 (!) 333 lb (151 kg)  11/01/21 (!) 330 lb (149.7 kg)    BMI Readings from Last 5 Encounters:  08/16/22 45.92 kg/m  06/17/22 48.50 kg/m  12/17/21 48.87 kg/m  12/05/21 47.78 kg/m  11/01/21 47.35 kg/m     Physical Exam General: Sitting in chair, no acute distress Eyes: EOMI, no icterus Neck: Supple, no JVP Pulmonary: Clear, normal work of breathing Cardiovascular: Warm, regular rate and rhythm, no murmur Abdomen: Nondistended, bowel sounds present MSK: No synovitis, no joint effusion Neuro: Normal gait, no weakness Psych: Normal mood, full affect   Assessment & Plan:   Dyspnea on exertion: Suspect multifactorial related to habitus, deconditioning, cardiac causes given atrial fibrillation-whether tachycardia induced or chronotropic insufficiency in the setting of diltiazem-as well as evidence of elevated LVEDP on catheterization in 2017 likely worsening with exertion.  CT of the chest is clear.  Asthma could be possible.  He has adverse reactions to steroids in the past.  Trial Stiolto 2 puffs once a day.  PFTs for further evaluation.   Return in about 2 months (around 10/16/2022).   Karren Burly, MD 08/16/2022   This appointment required 60 minutes of patient care (this includes precharting, chart review, review of results, face-to-face care, etc.).

## 2022-08-16 NOTE — Patient Instructions (Signed)
Nice to meet you  The CT scan of your chest does look clear.  This is reassuring.  If there are issues in the lungs causing her shortness of breath I think it will be in the air tubes.  To help treat this, use Stiolto 2 puffs once a day every day.  If this is helpful please send me a message and I can prescribe it.  Each inhaler I provided should last 2 weeks, a total of 4 weeks between the 2 inhalers.  If it is not helpful, that is okay, it may be that inhalers will not help.  For further evaluation and try to arrive at a diagnosis, I recommend pulmonary function test.  These can be scheduled at your convenience as soon as possible.  Return to clinic in 2 months or sooner as needed with Dr. Judeth Horn

## 2022-08-29 DIAGNOSIS — E291 Testicular hypofunction: Secondary | ICD-10-CM | POA: Diagnosis not present

## 2022-09-04 ENCOUNTER — Ambulatory Visit (INDEPENDENT_AMBULATORY_CARE_PROVIDER_SITE_OTHER): Payer: Medicare Other | Admitting: Podiatry

## 2022-09-04 ENCOUNTER — Encounter: Payer: Self-pay | Admitting: Podiatry

## 2022-09-04 DIAGNOSIS — M79675 Pain in left toe(s): Secondary | ICD-10-CM | POA: Diagnosis not present

## 2022-09-04 DIAGNOSIS — E119 Type 2 diabetes mellitus without complications: Secondary | ICD-10-CM

## 2022-09-04 DIAGNOSIS — M79674 Pain in right toe(s): Secondary | ICD-10-CM

## 2022-09-04 DIAGNOSIS — B351 Tinea unguium: Secondary | ICD-10-CM | POA: Diagnosis not present

## 2022-09-04 NOTE — Progress Notes (Signed)
This patient returns to my office for at risk foot care.  This patient requires this care by a professional since this patient will be at risk due to having  diabetes.This patient is unable to cut nails themselves since the patient cannot reach their nails.These nails are painful walking and wearing shoes.  This patient presents for at risk foot care today.  General Appearance  Alert, conversant and in no acute stress.  Vascular  Dorsalis pedis  are palpable  bilaterally. Posterior tibial pulses are not palpable. Capillary return is within normal limits  bilaterally. Temperature is within normal limits  bilaterally.  Neurologic  Senn-Weinstein monofilament wire test within normal limits  bilaterally. Muscle power within normal limits bilaterally.  Nails Thick disfigured discolored nails with subungual debris  from hallux to fifth toes bilaterally. No evidence of bacterial infection or drainage bilaterally.  Orthopedic  No limitations of motion  feet .  No crepitus or effusions noted.  No bony pathology or digital deformities noted.  Mild  HAV  B/L.  Skin  normotropic skin with no porokeratosis noted bilaterally.  No signs of infections or ulcers noted.     Onychomycosis  Pain in right toe  Pain in left toe.  Consent was obtained for treatment procedures.  Debridement and grinding of long thick nails with clearing of subungual debris.  No infection or ulcer.     Return office visit  3 months        Told patient to return for periodic foot care and evaluation due to potential at risk complications.   Esmerelda Finnigan DPM  

## 2022-09-05 ENCOUNTER — Ambulatory Visit: Payer: Medicare Other | Admitting: Podiatry

## 2022-09-18 DIAGNOSIS — M1612 Unilateral primary osteoarthritis, left hip: Secondary | ICD-10-CM | POA: Diagnosis not present

## 2022-09-18 DIAGNOSIS — M47816 Spondylosis without myelopathy or radiculopathy, lumbar region: Secondary | ICD-10-CM | POA: Diagnosis not present

## 2022-09-19 DIAGNOSIS — E291 Testicular hypofunction: Secondary | ICD-10-CM | POA: Diagnosis not present

## 2022-09-20 ENCOUNTER — Telehealth: Payer: Self-pay | Admitting: Pulmonary Disease

## 2022-09-20 MED ORDER — STIOLTO RESPIMAT 2.5-2.5 MCG/ACT IN AERS: 2.0000 | INHALATION_SPRAY | Freq: Every day | RESPIRATORY_TRACT | 6 refills | Status: DC

## 2022-09-20 NOTE — Telephone Encounter (Signed)
Patient liked sample of Stiolto and has requested prescription. I have sent to pharmacy and routing to Dr. Judeth Horn only as Lorain Childes

## 2022-09-20 NOTE — Telephone Encounter (Signed)
Patient states needs new RX for Stiolto Respimat. Pharmacy is [facility] [address]. Patient phone number is [phone] and [phone].

## 2022-10-10 DIAGNOSIS — E291 Testicular hypofunction: Secondary | ICD-10-CM | POA: Diagnosis not present

## 2022-10-16 ENCOUNTER — Ambulatory Visit (INDEPENDENT_AMBULATORY_CARE_PROVIDER_SITE_OTHER): Payer: Medicare Other | Admitting: Pulmonary Disease

## 2022-10-16 DIAGNOSIS — R0609 Other forms of dyspnea: Secondary | ICD-10-CM

## 2022-10-16 LAB — PULMONARY FUNCTION TEST
DL/VA % pred: 158 %
DL/VA: 6.16 ml/min/mmHg/L
DLCO cor % pred: 118 %
DLCO cor: 30.06 ml/min/mmHg
DLCO unc % pred: 118 %
DLCO unc: 30.06 ml/min/mmHg
FEF 25-75 Post: 2.18 L/sec
FEF 25-75 Pre: 2.04 L/sec
FEF2575-%Change-Post: 6 %
FEF2575-%Pred-Post: 100 %
FEF2575-%Pred-Pre: 93 %
FEV1-%Change-Post: 2 %
FEV1-%Pred-Post: 68 %
FEV1-%Pred-Pre: 66 %
FEV1-Post: 2.11 L
FEV1-Pre: 2.06 L
FEV1FVC-%Change-Post: 4 %
FEV1FVC-%Pred-Pre: 110 %
FEV6-%Change-Post: 0 %
FEV6-%Pred-Post: 63 %
FEV6-%Pred-Pre: 63 %
FEV6-Post: 2.56 L
FEV6-Pre: 2.57 L
FEV6FVC-%Change-Post: 1 %
FEV6FVC-%Pred-Post: 106 %
FEV6FVC-%Pred-Pre: 105 %
FVC-%Change-Post: -1 %
FVC-%Pred-Post: 59 %
FVC-%Pred-Pre: 60 %
FVC-Post: 2.56 L
FVC-Pre: 2.59 L
Post FEV1/FVC ratio: 83 %
Post FEV6/FVC ratio: 100 %
Pre FEV1/FVC ratio: 79 %
Pre FEV6/FVC Ratio: 99 %
RV % pred: 104 %
RV: 2.79 L
TLC % pred: 74 %
TLC: 5.39 L

## 2022-10-16 NOTE — Progress Notes (Signed)
 Full PFT performed today. °

## 2022-10-16 NOTE — Patient Instructions (Signed)
 Full PFT performed today. °

## 2022-10-17 ENCOUNTER — Encounter: Payer: Self-pay | Admitting: Pulmonary Disease

## 2022-10-17 ENCOUNTER — Ambulatory Visit: Payer: Medicare Other | Admitting: Pulmonary Disease

## 2022-10-17 VITALS — BP 130/70 | HR 59 | Ht 71.0 in | Wt 338.0 lb

## 2022-10-17 DIAGNOSIS — R0609 Other forms of dyspnea: Secondary | ICD-10-CM

## 2022-10-17 MED ORDER — STIOLTO RESPIMAT 2.5-2.5 MCG/ACT IN AERS: 2.0000 | INHALATION_SPRAY | Freq: Every day | RESPIRATORY_TRACT | 0 refills | Status: DC

## 2022-10-17 NOTE — Patient Instructions (Signed)
Nice to see you again  The pulmonary function test overall look okay no COPD  There is some air trapped in the lungs possibly as a consequence of smoking in the past or asthma over time  The Stiolto I think is a good choice to treat this, please continue  If you are willing, please call [facility] and see if we were to prescribe this to the mail order pharmacy if the cost of Stiolto would go down for you  If the cost would be better via [facility] please let me know and I can resend the prescriptions to them  I provided some samples of Stiolto today  Return to clinic in 6 months or sooner as needed with Dr. Judeth Horn

## 2022-10-17 NOTE — Progress Notes (Signed)
@Patient  ID: Benjamin Shepard, male    DOB: Aug 02, 1945, 77 y.o.   MRN: [id number]  Chief Complaint  Patient presents with   Follow-up    F/U after PFT. States his breathing has been stable since last visit.     Referring provider: Alysia Penna, MD  HPI:   77 y.o. man whom we are seeing for evaluation of dyspnea on exertion.    Returns for planned follow-up.  Last visit prescribed Stiolto given prior history of smoking but also some concern for possible asthma and obstructive lung disease.  He thinks this helped today.  PFTs performed today.  Discussed in detail but largely reveal air trapping as well as moderate restriction largely related to habitus with low ERV and normal DLCO.  HPI at initial visit: Patient notes shortness of breath for some time now.  Months to years.  May be slightly progressing over time.  Worse on inclines or stairs but even on flat surfaces over a short distance he becomes so winded need to sit, rest.  With rest things improved.  No time of day when things are better or worse.  No position to make things better or worse.  Maybe a little worse with seasonal changes he thinks, heavy or hot air.  No other alleviating or exacerbating factors.  Not using inhalers.  Chest imaging is clear, CT scan 12/2021 personally reviewed interpret as clear lungs bilaterally, it is over timeframe of similar symptoms.  Scan 06/2021 also personally reviewed interpret as clear lungs bilaterally.  In 2017 he had a left heart catheterization that revealed no significant coronary disease but did have a LVEDP of 19.  He has atrial fibrillation.  On diltiazem for rate control.  A-fib was described as paroxysmal.  TTE in 2023 relatively reassuring.    Questionaires / Pulmonary Flowsheets:   ACT:      No data to display          MMRC:     No data to display          Epworth:      No data to display          Tests:   FENO:  No results found for:  "NITRICOXIDE"  PFT:    Latest Ref Rng & Units 10/16/2022    3:37 PM  PFT Results  FVC-Pre L 2.59  P  FVC-Predicted Pre % 60  P  FVC-Post L 2.56  P  FVC-Predicted Post % 59  P  Pre FEV1/FVC % % 79  P  Post FEV1/FCV % % 83  P  FEV1-Pre L 2.06  P  FEV1-Predicted Pre % 66  P  FEV1-Post L 2.11  P  DLCO uncorrected ml/min/mmHg 30.06  P  DLCO UNC% % 118  P  DLCO corrected ml/min/mmHg 30.06  P  DLCO COR %Predicted % 118  P  DLVA Predicted % 158  P  TLC L 5.39  P  TLC % Predicted % 74  P  RV % Predicted % 104  P    P Preliminary result  Personally reviewed interpreted spirometry just above restriction versus air trapping.  Lung volumes reveal mild restriction with severely low ERV, air trapping.  DLCO within normal limits.  WALK:      No data to display          Imaging: Personally reviewed and as per EMR discussion this note No results found.  Lab Results: Personally reviewed CBC    Component Value Date/Time  WBC 7.6 05/25/2021 1245   WBC 7.4 05/22/2015 1350   RBC 4.71 05/25/2021 1245   RBC 4.47 05/22/2015 1350   HGB 14.2 05/25/2021 1245   HCT 42.8 05/25/2021 1245   PLT 201 05/25/2021 1245   MCV 91 05/25/2021 1245   MCH 30.1 05/25/2021 1245   MCH 30.2 05/22/2015 1350   MCHC 33.2 05/25/2021 1245   MCHC 32.4 05/22/2015 1350   RDW 12.4 05/25/2021 1245   LYMPHSABS 1.4 04/12/2015 1328   MONOABS 0.5 04/12/2015 1328   EOSABS 0.0 04/12/2015 1328   BASOSABS 0.0 04/12/2015 1328    BMET    Component Value Date/Time   NA 140 08/30/2021 1107   K 4.2 08/30/2021 1107   CL 99 08/30/2021 1107   CO2 26 08/30/2021 1107   GLUCOSE 126 (H) 08/30/2021 1107   GLUCOSE 111 (H) 06/01/2015 0440   BUN 17 08/30/2021 1107   CREATININE 0.92 08/30/2021 1107   CREATININE 0.84 04/12/2015 1328   CALCIUM 9.2 08/30/2021 1107   GFRNONAA 83 03/13/2020 1002   GFRAA 96 03/13/2020 1002    BNP No results found for: "BNP"  ProBNP    Component Value Date/Time   PROBNP 422 05/25/2021  1245    Specialty Problems       Pulmonary Problems   OSA (obstructive sleep apnea)   Dyspnea    Allergies  Allergen Reactions   Shrimp [Shellfish Allergy]     "throat swelling, SOB"   Other Rash    Muscle relaxer    Immunization History  Administered Date(s) Administered   Influenza Split 02/04/2013, 02/08/2014   Influenza, High Dose Seasonal PF 02/08/2014, 02/05/2016   Influenza, Quadrivalent, Recombinant, Inj, Pf 02/18/2018, 12/04/2018, 12/22/2019, 01/01/2021   Pneumococcal Conjugate-13 05/26/2013, 12/08/2013, 07/06/2015   Td 07/10/2016   Tdap 05/26/2013   Zoster, Live 04/02/2011    Past Medical History:  Diagnosis Date   Anxiety    Back pain    BPH (benign prostatic hyperplasia)    Diabetes (HCC)    Edema    Edema of both lower extremities    Foley catheter in place    Food allergy    Gout    HTN (hypertension)    Hyperlipidemia    Joint pain    OSA (obstructive sleep apnea)    USES C-PAP   Psoriasis    Shortness of breath dyspnea    WITH EXERTION   Urinary retention     Tobacco History: Social History   Tobacco Use  Smoking Status Former   Current packs/day: 0.00   Types: Cigarettes   Quit date: 03/15/1998   Years since quitting: 24.6  Smokeless Tobacco Never   Counseling given: Not Answered   Continue to not smoke  Outpatient Encounter Medications as of 10/17/2022  Medication Sig   allopurinol (ZYLOPRIM) 100 MG tablet Take 100 mg by mouth daily.   apixaban (ELIQUIS) 5 MG TABS tablet Take 1 tablet (5 mg total) by mouth 2 (two) times daily.   atorvastatin (LIPITOR) 20 MG tablet Take 20 mg by mouth daily.   colchicine 0.6 MG tablet Take 0.6 mg by mouth daily as needed (gout).    diltiazem (TIAZAC) 360 MG 24 hr capsule Take 1 capsule (360 mg total) by mouth daily.   doxazosin (CARDURA) 8 MG tablet Take 2 tablets (16 mg total) by mouth daily. Must keep scheduled appointment for future refills. Thank you.   escitalopram (LEXAPRO) 10 MG tablet  Take 10 mg by mouth daily.   furosemide (LASIX)  20 MG tablet Take 20 mg by mouth daily as needed (leg swelling).   hydrochlorothiazide (HYDRODIURIL) 25 MG tablet Take 1 tablet (25 mg total) by mouth daily.   Lancets (ONETOUCH ULTRASOFT) lancets Test BS twice a day.  ICS9-250.00   losartan (COZAAR) 100 MG tablet Take 1 tablet (100 mg total) by mouth daily.   metFORMIN (GLUCOPHAGE) 500 MG tablet Take 500 mg by mouth daily with breakfast.    oxyCODONE-acetaminophen (PERCOCET/ROXICET) 5-325 MG tablet Take 1-2 tablets by mouth every 4 to 6 hours as needed for pain.   testosterone cypionate (DEPOTESTOSTERONE CYPIONATE) 200 MG/ML injection every 21 ( twenty-one) days.   Tiotropium Bromide-Olodaterol (STIOLTO RESPIMAT) 2.5-2.5 MCG/ACT AERS Inhale 2 puffs into the lungs daily.   Turmeric (QC TUMERIC COMPLEX PO) Take 1,000 mg by mouth daily.   Vitamin D, Ergocalciferol, (DRISDOL) 1.25 MG (50000 UNIT) CAPS capsule Take 1 capsule (50,000 Units total) by mouth every 7 (seven) days.   No facility-administered encounter medications on file as of 10/17/2022.     Review of Systems  Review of Systems  N/a Physical Exam  BP 130/70   Pulse (!) 59   Ht 5\' 11"  (1.803 m)   Wt (!) 338 lb (153.3 kg)   SpO2 95% Comment: on RA  BMI 47.14 kg/m   Wt Readings from Last 5 Encounters:  10/17/22 (!) 338 lb (153.3 kg)  08/16/22 (!) 320 lb (145.2 kg)  06/17/22 (!) 338 lb (153.3 kg)  12/17/21 (!) 340 lb 9.6 oz (154.5 kg)  12/05/21 (!) 333 lb (151 kg)    BMI Readings from Last 5 Encounters:  10/17/22 47.14 kg/m  08/16/22 45.92 kg/m  06/17/22 48.50 kg/m  12/17/21 48.87 kg/m  12/05/21 47.78 kg/m     Physical Exam General: Sitting in chair, no acute distress Eyes: EOMI, no icterus Neck: Supple, no JVP Pulmonary: Clear, normal work of breathing Cardiovascular: Warm, regular rate and rhythm, no murmur Abdomen: Nondistended, bowel sounds present MSK: No synovitis, no joint effusion Neuro: Normal  gait, no weakness Psych: Normal mood, full affect   Assessment & Plan:   Dyspnea on exertion: Suspect multifactorial related to habitus, deconditioning, cardiac causes given atrial fibrillation-whether tachycardia induced or chronotropic insufficiency in the setting of diltiazem-as well as evidence of elevated LVEDP on catheterization in 2017 likely worsening with exertion.  CT of the chest is clear.  Asthma could be possible.  He has adverse reactions to steroids in the past.  Prescribe Stiolto 07/2022 with mild improvement.  PFT 09/2022 reveal air trapping as well as mild restriction related to habitus in the setting of low ERV and normal DLCO.  Continue Stiolto, had mild adverse reaction with lightheadedness after albuterol during PFT.  Recommend we avoid this for now.   Return in about 6 months (around 04/19/2023).   Karren Burly, MD 10/17/2022

## 2022-10-17 NOTE — Addendum Note (Signed)
Addended by: Maurene Capes on: 10/17/2022 03:26 PM   Modules accepted: Orders

## 2022-10-31 DIAGNOSIS — E291 Testicular hypofunction: Secondary | ICD-10-CM | POA: Diagnosis not present

## 2022-11-21 DIAGNOSIS — E291 Testicular hypofunction: Secondary | ICD-10-CM | POA: Diagnosis not present

## 2022-11-26 ENCOUNTER — Other Ambulatory Visit: Payer: Self-pay

## 2022-11-26 MED ORDER — APIXABAN 5 MG PO TABS: 5.0000 mg | ORAL_TABLET | Freq: Two times a day (BID) | ORAL | 0 refills | Status: DC

## 2022-11-26 NOTE — Telephone Encounter (Signed)
Prescription refill request for Eliquis received. Indication:afib Last office visit:3/24 Scr:0.92 2023 Age: 77 Weight:153.3  kg  Prescription refilled

## 2022-11-29 DIAGNOSIS — M1612 Unilateral primary osteoarthritis, left hip: Secondary | ICD-10-CM | POA: Diagnosis not present

## 2022-12-05 ENCOUNTER — Encounter: Payer: Self-pay | Admitting: Podiatry

## 2022-12-05 ENCOUNTER — Ambulatory Visit (INDEPENDENT_AMBULATORY_CARE_PROVIDER_SITE_OTHER): Payer: Medicare Other | Admitting: Podiatry

## 2022-12-05 DIAGNOSIS — E119 Type 2 diabetes mellitus without complications: Secondary | ICD-10-CM | POA: Diagnosis not present

## 2022-12-05 DIAGNOSIS — M79675 Pain in left toe(s): Secondary | ICD-10-CM

## 2022-12-05 DIAGNOSIS — B351 Tinea unguium: Secondary | ICD-10-CM | POA: Diagnosis not present

## 2022-12-05 DIAGNOSIS — M79674 Pain in right toe(s): Secondary | ICD-10-CM | POA: Diagnosis not present

## 2022-12-05 NOTE — Progress Notes (Signed)
This patient returns to my office for at risk foot care.  This patient requires this care by a professional since this patient will be at risk due to having  diabetes.This patient is unable to cut nails themselves since the patient cannot reach their nails.These nails are painful walking and wearing shoes.  This patient presents for at risk foot care today.  General Appearance  Alert, conversant and in no acute stress.  Vascular  Dorsalis pedis  are palpable  bilaterally. Posterior tibial pulses are not palpable. Capillary return is within normal limits  bilaterally. Temperature is within normal limits  bilaterally.  Neurologic  Senn-Weinstein monofilament wire test within normal limits  bilaterally. Muscle power within normal limits bilaterally.  Nails Thick disfigured discolored nails with subungual debris  from hallux to fifth toes bilaterally. No evidence of bacterial infection or drainage bilaterally.  Orthopedic  No limitations of motion  feet .  No crepitus or effusions noted.  No bony pathology or digital deformities noted.  Mild  HAV  B/L.  Skin  normotropic skin with no porokeratosis noted bilaterally.  No signs of infections or ulcers noted.     Onychomycosis  Pain in right toe  Pain in left toe.  Consent was obtained for treatment procedures.  Debridement and grinding of long thick nails with clearing of subungual debris.  No infection or ulcer.     Return office visit  3 months        Told patient to return for periodic foot care and evaluation due to potential at risk complications.   Gregory Mayer DPM  

## 2022-12-12 DIAGNOSIS — E291 Testicular hypofunction: Secondary | ICD-10-CM | POA: Diagnosis not present

## 2022-12-16 ENCOUNTER — Other Ambulatory Visit: Payer: Self-pay | Admitting: *Deleted

## 2022-12-16 MED ORDER — DOXAZOSIN MESYLATE 8 MG PO TABS: 16.0000 mg | ORAL_TABLET | Freq: Every day | ORAL | 2 refills | Status: DC

## 2023-01-02 DIAGNOSIS — E291 Testicular hypofunction: Secondary | ICD-10-CM | POA: Diagnosis not present

## 2023-01-10 DIAGNOSIS — E119 Type 2 diabetes mellitus without complications: Secondary | ICD-10-CM | POA: Diagnosis not present

## 2023-01-10 DIAGNOSIS — H26491 Other secondary cataract, right eye: Secondary | ICD-10-CM | POA: Diagnosis not present

## 2023-01-10 DIAGNOSIS — H524 Presbyopia: Secondary | ICD-10-CM | POA: Diagnosis not present

## 2023-01-10 DIAGNOSIS — H52221 Regular astigmatism, right eye: Secondary | ICD-10-CM | POA: Diagnosis not present

## 2023-01-10 DIAGNOSIS — H35373 Puckering of macula, bilateral: Secondary | ICD-10-CM | POA: Diagnosis not present

## 2023-01-10 DIAGNOSIS — Z961 Presence of intraocular lens: Secondary | ICD-10-CM | POA: Diagnosis not present

## 2023-01-10 DIAGNOSIS — Z9842 Cataract extraction status, left eye: Secondary | ICD-10-CM | POA: Diagnosis not present

## 2023-01-10 DIAGNOSIS — H5211 Myopia, right eye: Secondary | ICD-10-CM | POA: Diagnosis not present

## 2023-01-10 DIAGNOSIS — H53143 Visual discomfort, bilateral: Secondary | ICD-10-CM | POA: Diagnosis not present

## 2023-01-13 ENCOUNTER — Ambulatory Visit: Payer: Medicare Other | Attending: Physician Assistant

## 2023-01-13 DIAGNOSIS — I5189 Other ill-defined heart diseases: Secondary | ICD-10-CM | POA: Diagnosis not present

## 2023-01-13 DIAGNOSIS — I7121 Aneurysm of the ascending aorta, without rupture: Secondary | ICD-10-CM | POA: Diagnosis not present

## 2023-01-13 DIAGNOSIS — I4891 Unspecified atrial fibrillation: Secondary | ICD-10-CM | POA: Diagnosis not present

## 2023-01-13 DIAGNOSIS — Z794 Long term (current) use of insulin: Secondary | ICD-10-CM | POA: Diagnosis not present

## 2023-01-13 DIAGNOSIS — R0609 Other forms of dyspnea: Secondary | ICD-10-CM

## 2023-01-13 DIAGNOSIS — E1159 Type 2 diabetes mellitus with other circulatory complications: Secondary | ICD-10-CM | POA: Diagnosis not present

## 2023-01-13 DIAGNOSIS — G4733 Obstructive sleep apnea (adult) (pediatric): Secondary | ICD-10-CM | POA: Diagnosis not present

## 2023-01-14 LAB — LIPID PANEL
Chol/HDL Ratio: 2.7 {ratio} (ref 0.0–5.0)
Cholesterol, Total: 109 mg/dL (ref 100–199)
HDL: 41 mg/dL (ref >39)
LDL Chol Calc (NIH): 55 mg/dL (ref 0–99)
Triglycerides: 58 mg/dL (ref 0–149)
VLDL Cholesterol Cal: 13 mg/dL (ref 5–40)

## 2023-01-14 LAB — HEPATIC FUNCTION PANEL
ALT: 34 [IU]/L (ref 0–44)
AST: 20 [IU]/L (ref 0–40)
Albumin: 3.8 g/dL (ref 3.8–4.8)
Alkaline Phosphatase: 87 [IU]/L (ref 44–121)
Bilirubin Total: 0.3 mg/dL (ref 0.0–1.2)
Bilirubin, Direct: 0.11 mg/dL (ref 0.00–0.40)
Total Protein: 6 g/dL (ref 6.0–8.5)

## 2023-01-15 DIAGNOSIS — M109 Gout, unspecified: Secondary | ICD-10-CM | POA: Diagnosis not present

## 2023-01-15 DIAGNOSIS — E785 Hyperlipidemia, unspecified: Secondary | ICD-10-CM | POA: Diagnosis not present

## 2023-01-15 DIAGNOSIS — R7301 Impaired fasting glucose: Secondary | ICD-10-CM | POA: Diagnosis not present

## 2023-01-15 DIAGNOSIS — E291 Testicular hypofunction: Secondary | ICD-10-CM | POA: Diagnosis not present

## 2023-01-15 DIAGNOSIS — Z1389 Encounter for screening for other disorder: Secondary | ICD-10-CM | POA: Diagnosis not present

## 2023-01-15 DIAGNOSIS — I10 Essential (primary) hypertension: Secondary | ICD-10-CM | POA: Diagnosis not present

## 2023-01-15 DIAGNOSIS — Z125 Encounter for screening for malignant neoplasm of prostate: Secondary | ICD-10-CM | POA: Diagnosis not present

## 2023-01-22 DIAGNOSIS — Z23 Encounter for immunization: Secondary | ICD-10-CM | POA: Diagnosis not present

## 2023-01-22 DIAGNOSIS — D692 Other nonthrombocytopenic purpura: Secondary | ICD-10-CM | POA: Diagnosis not present

## 2023-01-22 DIAGNOSIS — I10 Essential (primary) hypertension: Secondary | ICD-10-CM | POA: Diagnosis not present

## 2023-01-22 DIAGNOSIS — Z1331 Encounter for screening for depression: Secondary | ICD-10-CM | POA: Diagnosis not present

## 2023-01-22 DIAGNOSIS — E291 Testicular hypofunction: Secondary | ICD-10-CM | POA: Diagnosis not present

## 2023-01-22 DIAGNOSIS — E1121 Type 2 diabetes mellitus with diabetic nephropathy: Secondary | ICD-10-CM | POA: Diagnosis not present

## 2023-01-22 DIAGNOSIS — E785 Hyperlipidemia, unspecified: Secondary | ICD-10-CM | POA: Diagnosis not present

## 2023-01-22 DIAGNOSIS — D6859 Other primary thrombophilia: Secondary | ICD-10-CM | POA: Diagnosis not present

## 2023-01-22 DIAGNOSIS — Z1339 Encounter for screening examination for other mental health and behavioral disorders: Secondary | ICD-10-CM | POA: Diagnosis not present

## 2023-01-22 DIAGNOSIS — I4891 Unspecified atrial fibrillation: Secondary | ICD-10-CM | POA: Diagnosis not present

## 2023-01-22 DIAGNOSIS — Z0000 Encounter for general adult medical examination without abnormal findings: Secondary | ICD-10-CM | POA: Diagnosis not present

## 2023-01-26 ENCOUNTER — Other Ambulatory Visit: Payer: Self-pay | Admitting: Internal Medicine

## 2023-01-29 ENCOUNTER — Other Ambulatory Visit: Payer: Self-pay

## 2023-01-29 DIAGNOSIS — I4891 Unspecified atrial fibrillation: Secondary | ICD-10-CM

## 2023-01-29 MED ORDER — APIXABAN 5 MG PO TABS: 5.0000 mg | ORAL_TABLET | Freq: Two times a day (BID) | ORAL | 5 refills | Status: DC

## 2023-01-29 NOTE — Telephone Encounter (Signed)
Prescription refill request for Eliquis received. Indication: AFIB  Last office visit: 06/17/22 Asa Lente)  Scr: 1.04 (01/18/23 via [facility])  Age: 77 Weight: 153.3kg  Appropriate dose. Refill sent.

## 2023-02-13 DIAGNOSIS — E291 Testicular hypofunction: Secondary | ICD-10-CM | POA: Diagnosis not present

## 2023-02-21 ENCOUNTER — Ambulatory Visit: Payer: Medicare Other | Attending: Internal Medicine | Admitting: Internal Medicine

## 2023-02-21 ENCOUNTER — Encounter: Payer: Self-pay | Admitting: Internal Medicine

## 2023-02-21 ENCOUNTER — Ambulatory Visit (INDEPENDENT_AMBULATORY_CARE_PROVIDER_SITE_OTHER): Payer: Medicare Other

## 2023-02-21 VITALS — BP 120/52 | HR 64 | Ht 73.0 in | Wt 338.0 lb

## 2023-02-21 DIAGNOSIS — D6859 Other primary thrombophilia: Secondary | ICD-10-CM | POA: Diagnosis not present

## 2023-02-21 DIAGNOSIS — I7121 Aneurysm of the ascending aorta, without rupture: Secondary | ICD-10-CM

## 2023-02-21 DIAGNOSIS — E1159 Type 2 diabetes mellitus with other circulatory complications: Secondary | ICD-10-CM | POA: Diagnosis not present

## 2023-02-21 DIAGNOSIS — I4891 Unspecified atrial fibrillation: Secondary | ICD-10-CM | POA: Insufficient documentation

## 2023-02-21 DIAGNOSIS — G4733 Obstructive sleep apnea (adult) (pediatric): Secondary | ICD-10-CM | POA: Diagnosis not present

## 2023-02-21 DIAGNOSIS — I152 Hypertension secondary to endocrine disorders: Secondary | ICD-10-CM | POA: Diagnosis not present

## 2023-02-21 DIAGNOSIS — E7849 Other hyperlipidemia: Secondary | ICD-10-CM | POA: Diagnosis not present

## 2023-02-21 NOTE — Progress Notes (Signed)
Cardiology Office Note:    Date:  02/21/2023   ID:  Benjamin Shepard, DOB Sep 30, 1945, MRN [id number]  PCP:  Alysia Penna, MD  Cardiologist:  Christell Constant, MD   Referring MD: Alysia Penna, MD   CC: Transition to new cardiologist  History of Present Illness:    Benjamin Shepard is a 77 y.o. male with a hx of dyspnea on exertion, type 2 diabetes mellitus, hypertension, hyperlipidemia, morbid obesity, and obstructive sleep apnea.  New atrial fibrillation with controlled ventricular  response and subsequent monitor demonstrated AF burden of 12%. 2023: Asymptomatic with Dr. Katrinka Blazing  Discussed the use of AI scribe software for clinical note transcription with the patient, who gave verbal consent to proceed.  Mr. Benjamin Shepard, with a history of diabetes, obstructive sleep apnea, hypertension, and hyperlipidemia, presents with paroxysmal atrial fibrillation. The patient's atrial fibrillation burden was about twelve percent during his last consultation with Dr. Katrinka Blazing. He is currently on Eliquis for stroke prevention, with no reported bleeding issues.  The patient also has a thoracic aortic aneurysm, which was last evaluated via CT in 2023, revealing a 46mm aneurysm. However, there has been no follow-up imaging since then. The patient's BMI has significantly improved, and his blood pressure is well-controlled at 120/50.  The patient reports no chest pain or breathing issues, but does mention some labored breathing, which is being managed by a pulmonologist. He is on an inhaler for obstructive lung disease. The patient also reports occasional swelling in the left leg, for which he takes Lasix as needed.  The patient is a widow and spends his time maintaining his household and visiting family. He reports no sudden deaths or familial dissection in his family history. He is compliant with his CPAP for sleep apnea and reports no issues with his blood thinner.  The patient has diabetes  and is considering the addition of a GLP-1 receptor antagonist for weight loss. He has no history of medullary thyroid carcinoma, MEN2A cancer, or pancreatitis. The patient is not a smoker and does not consume alcohol. He is moderately active and is considering increasing his physical activity to help manage his atrial fibrillation.   Past Medical History:  Diagnosis Date   Anxiety    Back pain    BPH (benign prostatic hyperplasia)    Diabetes (HCC)    Edema    Edema of both lower extremities    Foley catheter in place    Food allergy    Gout    HTN (hypertension)    Hyperlipidemia    Joint pain    OSA (obstructive sleep apnea)    USES C-PAP   Psoriasis    Shortness of breath dyspnea    WITH EXERTION   Urinary retention     Past Surgical History:  Procedure Laterality Date   BACK SURGERY     AGE 17   CARDIAC CATHETERIZATION N/A 04/14/2015   Procedure: Left Heart Cath and Coronary Angiography;  Surgeon: Lyn Records, MD;  Location: [facility] INVASIVE CV LAB;  Service: Cardiovascular;  Laterality: N/A;   ROBOT ASSISTED INGUINAL HERNIA REPAIR Bilateral 05/31/2015   Procedure: ROBOT ASSISTED INGUINAL HERNIA REPAIR;  Surgeon: Sebastian Ache, MD;  Location: [facility];  Service: Urology;  Laterality: Bilateral;   XI ROBOTIC ASSISTED SIMPLE PROSTATECTOMY N/A 05/31/2015   Procedure: XI ROBOTIC ASSISTED SIMPLE PROSTATECTOMY;  Surgeon: Sebastian Ache, MD;  Location: [facility];  Service: Urology;  Laterality: N/A;    Current Medications: Current Meds  Medication Sig   allopurinol (  ZYLOPRIM) 100 MG tablet Take 100 mg by mouth daily.   apixaban (ELIQUIS) 5 MG TABS tablet Take 1 tablet (5 mg total) by mouth 2 (two) times daily.   atorvastatin (LIPITOR) 20 MG tablet Take 20 mg by mouth daily.   colchicine 0.6 MG tablet Take 0.6 mg by mouth daily as needed (gout).    diltiazem (TIAZAC) 360 MG 24 hr capsule Take 1 capsule (360 mg total) by mouth daily.   doxazosin (CARDURA) 8 MG tablet Take 2 tablets (16 mg  total) by mouth daily. Must keep scheduled appointment for future refills. Thank you.   escitalopram (LEXAPRO) 10 MG tablet Take 10 mg by mouth daily.   furosemide (LASIX) 20 MG tablet Take 20 mg by mouth daily as needed (leg swelling).   hydrochlorothiazide (HYDRODIURIL) 25 MG tablet Take 1 tablet (25 mg total) by mouth daily.   Lancets (ONETOUCH ULTRASOFT) lancets Test BS twice a day.  ICS9-250.00   losartan (COZAAR) 100 MG tablet Take 1 tablet (100 mg total) by mouth daily.   metFORMIN (GLUCOPHAGE) 500 MG tablet Take 500 mg by mouth daily with breakfast.    oxyCODONE-acetaminophen (PERCOCET/ROXICET) 5-325 MG tablet Take 1-2 tablets by mouth every 4 to 6 hours as needed for pain.   testosterone cypionate (DEPOTESTOSTERONE CYPIONATE) 200 MG/ML injection every 21 ( twenty-one) days.   Tiotropium Bromide-Olodaterol (STIOLTO RESPIMAT) 2.5-2.5 MCG/ACT AERS Inhale 2 puffs into the lungs daily.     Allergies:   Levaquin [levofloxacin], Shrimp [shellfish allergy], and Other   Social History   Socioeconomic History   Marital status: Widowed    Spouse name: Not on file   Number of children: Not on file   Years of education: Not on file   Highest education level: Not on file  Occupational History   Not on file  Tobacco Use   Smoking status: Former    Current packs/day: 0.00    Types: Cigarettes    Quit date: 03/15/1998    Years since quitting: 24.9   Smokeless tobacco: Never  Substance and Sexual Activity   Alcohol use: Yes    Comment: rare wine, beer   Drug use: No   Sexual activity: Not on file  Other Topics Concern   Not on file  Social History Narrative   Not on file   Social Determinants of Health   Financial Resource Strain: Not on file  Food Insecurity: Not on file  Transportation Needs: Not on file  Physical Activity: Not on file  Stress: Not on file  Social Connections: Not on file     Family History: The patient's family history includes Anxiety disorder in his  mother; Depression in his mother; Heart Problems in his mother; Heart disease in his brother, brother, and mother; Heart failure in his mother; High blood pressure in his mother; Hypertension in his father. There is no history of Colon cancer.  ROS:   Please see the history of present illness.      EKGs/Labs/Other Studies Reviewed:    Cardiac Studies & Procedures   CARDIAC CATHETERIZATION  CARDIAC CATHETERIZATION 04/14/2015  Narrative   False positive myocardial perfusion study.   Normal coronary arteries , with right dominant circulation.   Normal left ventricular systolic function. Mildly elevated end-diastolic pressure.  RECOMMENDATIONS:    The patient is now cleared to proceed with the upcoming prostate surgical procedure by Dr. Berneice Heinrich.   No significant cardiovascular abnormalities have been identified.  Findings Coronary Findings Diagnostic  Dominance: Right  No diagnostic  findings have been documented. Intervention  No interventions have been documented.   STRESS TESTS  MYOCARDIAL PERFUSION IMAGING 04/07/2015  Narrative  Nuclear stress EF: 72%.  Findings consistent with ischemia.  This is a low risk study.  The left ventricular ejection fraction is hyperdynamic (>65%).  Small area of mild anteroapical ischemia.  SDS 7 Normal EF 72%   ECHOCARDIOGRAM  ECHOCARDIOGRAM COMPLETE 05/30/2021  Narrative ECHOCARDIOGRAM REPORT    Patient Name:   QUENCY KITZINGER Date of Exam: 05/30/2021 Medical Rec #:  [id number]          Height:       70.0 in Accession #:    [id number]         Weight:       333.8 lb Date of Birth:  22-Apr-1945           BSA:          2.595 m Patient Age:    75 years           BP:           110/60 mmHg Patient Gender: M                  HR:           81 bpm. Exam Location:  [address]  Procedure: 2D Echo, Cardiac Doppler, Color Doppler and Intracardiac Opacification Agent  Indications:    I48.19 Atrial fibrillation  History:         Patient has no prior history of Echocardiogram examinations. Signs/Symptoms:Edema and Shortness of Breath; Risk Factors:Hypertension, Sleep Apnea, Diabetes, Dyslipidemia and Former Smoker. Anemia.  Sonographer:    Jorje Guild BS, RDCS Referring Phys: [phone] Barry Dienes [facility]   Sonographer Comments: Technically difficult study due to poor echo windows, suboptimal parasternal window and suboptimal apical window. Image acquisition challenging due to patient body habitus. IMPRESSIONS   1. Left ventricular ejection fraction, by estimation, is 60 to 65%. The left ventricle has normal function. The left ventricle has no regional wall motion abnormalities. There is mild concentric left ventricular hypertrophy. Left ventricular diastolic parameters were normal. 2. Right ventricular systolic function is normal. The right ventricular size is mildly enlarged. Tricuspid regurgitation signal is inadequate for assessing PA pressure. 3. The mitral valve is normal in structure. No evidence of mitral valve regurgitation. No evidence of mitral stenosis. 4. The aortic valve was not well visualized. Aortic valve regurgitation is not visualized. No aortic stenosis is present. Aortic valve area, by VTI measures 4.41 cm. Aortic valve mean gradient measures 8.0 mmHg. Aortic valve Vmax measures 1.90 m/s. 5. Aortic dilatation noted. 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 6. The inferior vena cava is normal in size with greater than 50% respiratory variability, suggesting right atrial pressure of 3 mmHg.  FINDINGS Left Ventricle: Left ventricular ejection fraction, by estimation, is 60 to 65%. The left ventricle has normal function. The left ventricle has no regional wall motion abnormalities. Definity contrast agent was given IV to delineate the left ventricular endocardial borders. The left ventricular internal cavity size was normal in size. There is mild concentric  left ventricular hypertrophy. Left ventricular diastolic parameters were normal. Normal left ventricular filling pressure.  Right Ventricle: The right ventricular size is mildly enlarged. No increase in right ventricular wall thickness. Right ventricular systolic function is normal. Tricuspid regurgitation signal is inadequate for assessing PA pressure.  Left Atrium: Left atrial size was normal in size.  Right Atrium: Right atrial size was normal in size.  Pericardium: There is no evidence of pericardial effusion. Presence of epicardial fat layer.  Mitral Valve: The mitral valve is normal in structure. Mild mitral annular calcification. No evidence of mitral valve regurgitation. No evidence of mitral valve stenosis.  Tricuspid Valve: The tricuspid valve is normal in structure. Tricuspid valve regurgitation is not demonstrated. No evidence of tricuspid stenosis.  Aortic Valve: The aortic valve was not well visualized. Aortic valve regurgitation is not visualized. No aortic stenosis is present. Aortic valve mean gradient measures 8.0 mmHg. Aortic valve peak gradient measures 14.5 mmHg. Aortic valve area, by VTI measures 4.41 cm.  Pulmonic Valve: The pulmonic valve was normal in structure. Pulmonic valve regurgitation is not visualized. No evidence of pulmonic stenosis.  Aorta: Aortic dilatation noted. There is an aneurysm involving the aortic arch measuring 47 mm. There is an aneurysm involving the ascending aorta measuring 48 mm.  Venous: The inferior vena cava is normal in size with greater than 50% respiratory variability, suggesting right atrial pressure of 3 mmHg.  IAS/Shunts: No atrial level shunt detected by color flow Doppler.   LEFT VENTRICLE PLAX 2D LVIDd:         5.20 cm   Diastology LVIDs:         3.50 cm   LV e' medial:    8.59 cm/s LV PW:         1.30 cm   LV E/e' medial:  13.3 LV IVS:        1.30 cm   LV e' lateral:   8.49 cm/s LVOT diam:     2.50 cm   LV E/e' lateral:  13.4 LV SV:         186 LV SV Index:   72 LVOT Area:     4.91 cm   RIGHT VENTRICLE             IVC RV Basal diam:  4.20 cm     IVC diam: 1.10 cm RV S prime:     17.95 cm/s TAPSE (M-mode): 3.0 cm  LEFT ATRIUM             Index        RIGHT ATRIUM           Index LA diam:        3.50 cm 1.35 cm/m   RA Pressure: 3.00 mmHg LA Vol (A2C):   77.1 ml 29.71 ml/m  RA Area:     15.50 cm LA Vol (A4C):   59.0 ml 22.74 ml/m  RA Volume:   42.90 ml  16.53 ml/m LA Biplane Vol: 70.0 ml 26.98 ml/m AORTIC VALVE AV Area (Vmax):    4.30 cm AV Area (Vmean):   4.28 cm AV Area (VTI):     4.41 cm AV Vmax:           190.50 cm/s AV Vmean:          125.000 cm/s AV VTI:            0.422 m AV Peak Grad:      14.5 mmHg AV Mean Grad:      8.0 mmHg LVOT Vmax:         167.00 cm/s LVOT Vmean:        109.000 cm/s LVOT VTI:          0.379 m LVOT/AV VTI ratio: 0.90  AORTA Ao Root diam: 4.70 cm Ao Asc diam:  4.80 cm  MITRAL VALVE                TRICUSPID VALVE Estimated RAP:  3.00 mmHg MV Decel Time: 320 msec MV E velocity: 114.00 cm/s  SHUNTS MV A velocity: 130.00 cm/s  Systemic VTI:  0.38 m MV E/A ratio:  0.88         Systemic Diam: 2.50 cm  Armanda Magic MD Electronically signed by Armanda Magic MD Signature Date/Time: 05/30/2021/11:33:46 AM    Final    MONITORS  LONG TERM MONITOR (3-14 DAYS) 06/05/2021  Narrative  Paroxysmal atrial fibrillation with 12% burden and longest continuous episode lasting 5 hours and 5 minutes. Heart rate range 35 to 129 bpm.  Basic underlying rhythm otherwise sinus rhythm and sinus bradycardia with heart rate range as noted above.  Brief SVT versus short runs of A-fib.  Ventricular ectopy less than 1% burden.   Patch Wear Time:  3 days and 0 hours (2023-02-24T12:27:16-0500 to 2023-02-27T12:51:58-0500)  Patient had a min HR of 35 bpm, max HR of 129 bpm, and avg HR of 51 bpm. Predominant underlying rhythm was Sinus Rhythm. First Degree AV Block was present.  3 Supraventricular Tachycardia runs occurred, the run with the fastest interval lasting 5 beats with a max rate of 108 bpm (avg 93 bpm); the run with the fastest interval was also the longest.  Some episodes of Supraventricular Tachycardia may be possible short runs of Atrial Fibrillation. Atrial Fibrillation occurred (12% burden), ranging from 42-129 bpm (avg of 64 bpm), the longest lasting 5 hours 5 mins with an avg rate of 62 bpm. Atrial Fibrillation was present at activation of device. Isolated SVEs were rare (<1.0%), SVE Couplets were rare (<1.0%), and SVE Triplets were rare (<1.0%). Isolated VEs were rare (<1.0%), VE Couplets were rare (<1.0%), and no VE Triplets were present.            Recent Labs: 01/13/2023: ALT 34  Recent Lipid Panel    Component Value Date/Time   CHOL 109 01/13/2023 0838   TRIG 58 01/13/2023 0838   HDL 41 01/13/2023 0838   CHOLHDL 2.7 01/13/2023 0838   LDLCALC 55 01/13/2023 0838    Physical Exam:    VS:  BP (!) 120/52   Pulse 64   Ht 6\' 1"  (1.854 m)   Wt (!) 338 lb (153.3 kg)   SpO2 96%   BMI 44.59 kg/m     Wt Readings from Last 3 Encounters:  02/21/23 (!) 338 lb (153.3 kg)  10/17/22 (!) 338 lb (153.3 kg)  08/16/22 (!) 320 lb (145.2 kg)     GEN: Morbidly obese HEENT: Normal NECK: No JVD. CARDIAC: Sight systolic murmur. IRIR. No gallop.  +1 edema left leg VASCULAR:  Normal Pulses.  RESPIRATORY:  Clear to auscultation without rales, wheezing or rhonchi  ABDOMEN: Soft, non-tender, non-distended, MUSCULOSKELETAL: No deformity  SKIN: Warm and dry NEUROLOGIC:  Alert and oriented x 3 PSYCHIATRIC:  Normal affect   ASSESSMENT:    1. Aneurysm of ascending aorta without rupture (HCC)   2. Atrial fibrillation, unspecified type (HCC)   3. OSA (obstructive sleep apnea)   4. Hypertension associated with type 2 diabetes mellitus (HCC)   5. Other hyperlipidemia   6. Morbid obesity (HCC)     PLAN:    Atrial Fibrillation Paroxysmal atrial  fibrillation with a 12% burden in 2023. Asymptomatic most of the time but occasionally symptomatic. On Eliquis for stroke prevention and diltiazem for rate control. Risk of congestive heart failure if atrial  fibrillation persists. Discussed weight loss medications (GLP-1 receptor antagonists) and potential side effects (reflux, constipation). Emphasized small meals, hydration, and avoiding acidic/spicy foods. Discussed ablation if atrial fibrillation persists despite weight loss and other measures. - Order EKG (ADDENDUM: SR with 1st HB) - Order two-week nonlife heart monitor (ziopatch) - Continue Eliquis and diltiazem - Refer to pharmacy clinic for discussion on GLP-1 receptor antagonists for weight loss if significant AF burden - Consider ablation if atrial fibrillation persists despite weight loss and other measures  Thoracic Aortic Aneurysm - Thoracic aortic aneurysm measuring 46 mm on CT scan from October 2023. Semiannual follow-up imaging overdue. No family history of thoracic aortic aneurysm or dissection. Blood pressure and cholesterol well controlled. Discussed avoiding fluoroquinolones due to increased risk of connective tissue disease. - Order repeat CT scan of the aorta - Educate family members about the condition and the importance of screening - Added allergy to fluoroquinolones in the chart  Hypertension - Well-controlled hypertension with current blood pressure at 120/50 mmHg.  - Continue current antihypertensive regimen  Hyperlipidemia - Hyperlipidemia well controlled with atorvastatin. LDL at 55 mg/dL.  Diabetes Mellitus - Diabetes mellitus with improved BMI.  Discussed potential use of GLP-1 receptor antagonists for weight loss and diabetes management with pharmacy clinic.  Obstructive Sleep Apnea - Obstructive sleep apnea managed with CPAP, used daily.  Follow-up - Schedule follow-up in one year unless new issues arise   Medication Adjustments/Labs and Tests  Ordered: Current medicines are reviewed at length with the patient today.  Concerns regarding medicines are outlined above.  Orders Placed This Encounter  Procedures   CT ANGIO CHEST AORTA W/CM & OR WO/CM   Basic metabolic panel   LONG TERM MONITOR (3-14 DAYS)   EKG 12-Lead   No orders of the defined types were placed in this encounter.   Patient Instructions  Medication Instructions:  Your physician recommends that you continue on your current medications as directed. Please refer to the Current Medication list given to you today.  *If you need a refill on your cardiac medications before your next appointment, please call your pharmacy*   Lab Work: BMP  If you have labs (blood work) drawn today and your tests are completely normal, you will receive your results only by: MyChart Message (if you have MyChart) OR A paper copy in the mail If you have any lab test that is abnormal or we need to change your treatment, we will call you to review the results.   Testing/Procedures: Your physician would like you to have a CT Aorta.  Your physician has requested that you wear a 2 week heart monitor.    Follow-Up: At [facility], you and your health needs are our priority.  As part of our continuing mission to provide you with exceptional heart care, we have created designated Provider Care Teams.  These Care Teams include your primary Cardiologist (physician) and Advanced Practice Providers (APPs -  Physician Assistants and Nurse Practitioners) who all work together to provide you with the care you need, when you need it.    Your next appointment:   1 year(s)  Provider:   Christell Constant, MD     Other Instructions Christena Deem- Long Term Monitor Instructions  Your physician has requested you wear a ZIO patch monitor for 14 days.  This is a single patch monitor. [employer] supplies one patch monitor per enrollment. Additional stickers are not available. Please do not  apply patch if you will be having  a Nuclear Stress Test,   Cardiac CT, MRI, or Chest Xray during the period you would be wearing the  monitor. The patch cannot be worn during these tests. You cannot remove and re-apply the  ZIO XT patch monitor.  Your ZIO patch monitor will be mailed 3 day USPS to your address on file. It may take 3-5 days  to receive your monitor after you have been enrolled.  Once you have received your monitor, please review the enclosed instructions. Your monitor  has already been registered assigning a specific monitor serial # to you.  Billing and Patient Assistance Program Information  We have supplied [employer] with any of your insurance information on file for billing purposes. [employer] offers a sliding scale Patient Assistance Program for patients that do not have  insurance, or whose insurance does not completely cover the cost of the ZIO monitor.  You must apply for the Patient Assistance Program to qualify for this discounted rate.  To apply, please call [employer] at [phone], select option 4, select option 2, ask to apply for  Patient Assistance Program. Meredeth Ide will ask your household income, and how many people  are in your household. They will quote your out-of-pocket cost based on that information.  [employer] will also be able to set up a 62-month, interest-free payment plan if needed.  Applying the monitor   Shave hair from upper left chest.  Hold abrader disc by orange tab. Rub abrader in 40 strokes over the upper left chest as  indicated in your monitor instructions.  Clean area with 4 enclosed alcohol pads. Let dry.  Apply patch as indicated in monitor instructions. Patch will be placed under collarbone on left  side of chest with arrow pointing upward.  Rub patch adhesive wings for 2 minutes. Remove white label marked "1". Remove the white  label marked "2". Rub patch adhesive wings for 2 additional minutes.  While looking in a mirror, press and  release button in center of patch. A small green light will  flash 3-4 times. This will be your only indicator that the monitor has been turned on.  Do not shower for the first 24 hours. You may shower after the first 24 hours.  Press the button if you feel a symptom. You will hear a small click. Record Date, Time and  Symptom in the Patient Logbook.  When you are ready to remove the patch, follow instructions on the last 2 pages of Patient  Logbook. Stick patch monitor onto the last page of Patient Logbook.  Place Patient Logbook in the blue and white box. Use locking tab on box and tape box closed  securely. The blue and white box has prepaid postage on it. Please place it in the mailbox as  soon as possible. Your physician should have your test results approximately 7 days after the  monitor has been mailed back to [facility].  Call [facility] Customer Care at [phone] if you have questions regarding  your ZIO XT patch monitor. Call them immediately if you see an orange light blinking on your  monitor.  If your monitor falls off in less than 4 days, contact our Monitor department at [phone].  If your monitor becomes loose or falls off after 4 days call [employer] at [phone] for  suggestions on securing your monitor     Signed, Christell Constant, MD  02/21/2023 5:02 PM    [facility]

## 2023-02-21 NOTE — Progress Notes (Unsigned)
Enrolled patient for a 14 day Zio XT  monitor to be mailed to patients home  °

## 2023-02-21 NOTE — Patient Instructions (Signed)
Medication Instructions:  Your physician recommends that you continue on your current medications as directed. Please refer to the Current Medication list given to you today.  *If you need a refill on your cardiac medications before your next appointment, please call your pharmacy*   Lab Work: BMP  If you have labs (blood work) drawn today and your tests are completely normal, you will receive your results only by: MyChart Message (if you have MyChart) OR A paper copy in the mail If you have any lab test that is abnormal or we need to change your treatment, we will call you to review the results.   Testing/Procedures: Your physician would like you to have a CT Aorta.  Your physician has requested that you wear a 2 week heart monitor.    Follow-Up: At [facility], you and your health needs are our priority.  As part of our continuing mission to provide you with exceptional heart care, we have created designated Provider Care Teams.  These Care Teams include your primary Cardiologist (physician) and Advanced Practice Providers (APPs -  Physician Assistants and Nurse Practitioners) who all work together to provide you with the care you need, when you need it.    Your next appointment:   1 year(s)  Provider:   Christell Constant, MD     Other Instructions Christena Deem- Long Term Monitor Instructions  Your physician has requested you wear a ZIO patch monitor for 14 days.  This is a single patch monitor. [employer] supplies one patch monitor per enrollment. Additional stickers are not available. Please do not apply patch if you will be having a Nuclear Stress Test,   Cardiac CT, MRI, or Chest Xray during the period you would be wearing the  monitor. The patch cannot be worn during these tests. You cannot remove and re-apply the  ZIO XT patch monitor.  Your ZIO patch monitor will be mailed 3 day USPS to your address on file. It may take 3-5 days  to receive your monitor after  you have been enrolled.  Once you have received your monitor, please review the enclosed instructions. Your monitor  has already been registered assigning a specific monitor serial # to you.  Billing and Patient Assistance Program Information  We have supplied [employer] with any of your insurance information on file for billing purposes. [employer] offers a sliding scale Patient Assistance Program for patients that do not have  insurance, or whose insurance does not completely cover the cost of the ZIO monitor.  You must apply for the Patient Assistance Program to qualify for this discounted rate.  To apply, please call [employer] at [phone], select option 4, select option 2, ask to apply for  Patient Assistance Program. Meredeth Ide will ask your household income, and how many people  are in your household. They will quote your out-of-pocket cost based on that information.  [employer] will also be able to set up a 91-month, interest-free payment plan if needed.  Applying the monitor   Shave hair from upper left chest.  Hold abrader disc by orange tab. Rub abrader in 40 strokes over the upper left chest as  indicated in your monitor instructions.  Clean area with 4 enclosed alcohol pads. Let dry.  Apply patch as indicated in monitor instructions. Patch will be placed under collarbone on left  side of chest with arrow pointing upward.  Rub patch adhesive wings for 2 minutes. Remove white label marked "1". Remove the white  label marked "  2". Rub patch adhesive wings for 2 additional minutes.  While looking in a mirror, press and release button in center of patch. A small green light will  flash 3-4 times. This will be your only indicator that the monitor has been turned on.  Do not shower for the first 24 hours. You may shower after the first 24 hours.  Press the button if you feel a symptom. You will hear a small click. Record Date, Time and  Symptom in the Patient Logbook.  When you are ready to  remove the patch, follow instructions on the last 2 pages of Patient  Logbook. Stick patch monitor onto the last page of Patient Logbook.  Place Patient Logbook in the blue and white box. Use locking tab on box and tape box closed  securely. The blue and white box has prepaid postage on it. Please place it in the mailbox as  soon as possible. Your physician should have your test results approximately 7 days after the  monitor has been mailed back to [facility].  Call [facility] Customer Care at [phone] if you have questions regarding  your ZIO XT patch monitor. Call them immediately if you see an orange light blinking on your  monitor.  If your monitor falls off in less than 4 days, contact our Monitor department at [phone].  If your monitor becomes loose or falls off after 4 days call [employer] at [phone] for  suggestions on securing your monitor

## 2023-02-22 LAB — BASIC METABOLIC PANEL
BUN/Creatinine Ratio: 17 (ref 10–24)
BUN: 16 mg/dL (ref 8–27)
CO2: 23 mmol/L (ref 20–29)
Calcium: 9.8 mg/dL (ref 8.6–10.2)
Chloride: 99 mmol/L (ref 96–106)
Creatinine, Ser: 0.94 mg/dL (ref 0.76–1.27)
Glucose: 121 mg/dL — ABNORMAL HIGH (ref 70–99)
Potassium: 3.3 mmol/L — ABNORMAL LOW (ref 3.5–5.2)
Sodium: 139 mmol/L (ref 134–144)
eGFR: 82 mL/min/{1.73_m2} (ref >59)

## 2023-02-24 ENCOUNTER — Telehealth: Payer: Self-pay

## 2023-02-24 DIAGNOSIS — E876 Hypokalemia: Secondary | ICD-10-CM

## 2023-02-24 MED ORDER — POTASSIUM CHLORIDE CRYS ER 20 MEQ PO TBCR: 20.0000 meq | EXTENDED_RELEASE_TABLET | Freq: Every day | ORAL | 3 refills | Status: DC

## 2023-02-24 NOTE — Telephone Encounter (Signed)
The patient has been notified of the result and verbalized understanding.  All questions (if any) were answered. Benjamin Burows, RN 02/24/2023 4:24 PM   Advised pt to come in for repeat lab work in 1-2 weeks.  Pt expresses understanding.  Order placed and released for draw.

## 2023-02-24 NOTE — Telephone Encounter (Signed)
-----   Message from Christell Constant sent at 02/22/2023  7:29 PM EST ----- Regarding: FW: Hypokalemia with a potassium with 3.3. There are no barriers to CTA order; recommend potassium 20 mill equivalence daily and repeat BMP in one to two weeks ----- Message ----- From: Interface, [facility] Lab Results In Sent: 02/22/2023   2:36 AM EST To: Christell Constant, MD

## 2023-03-03 ENCOUNTER — Ambulatory Visit (HOSPITAL_COMMUNITY)
Admission: RE | Admit: 2023-03-03 | Discharge: 2023-03-03 | Disposition: A | Discharge status: Home / Self Care | Payer: Medicare Other | Source: Ambulatory Visit | Attending: Internal Medicine | Admitting: Internal Medicine

## 2023-03-03 ENCOUNTER — Encounter (HOSPITAL_COMMUNITY): Payer: Self-pay

## 2023-03-03 DIAGNOSIS — I7121 Aneurysm of the ascending aorta, without rupture: Secondary | ICD-10-CM | POA: Insufficient documentation

## 2023-03-03 MED ORDER — IOHEXOL 350 MG/ML SOLN
75.0000 mL | Freq: Once | INTRAVENOUS | Status: AC | PRN
  Administered 2023-03-03: 80 mL via INTRAVENOUS

## 2023-03-04 DIAGNOSIS — I7121 Aneurysm of the ascending aorta, without rupture: Secondary | ICD-10-CM | POA: Diagnosis not present

## 2023-03-04 DIAGNOSIS — I4891 Unspecified atrial fibrillation: Secondary | ICD-10-CM | POA: Diagnosis not present

## 2023-03-06 ENCOUNTER — Encounter: Payer: Self-pay | Admitting: Podiatry

## 2023-03-06 ENCOUNTER — Ambulatory Visit (INDEPENDENT_AMBULATORY_CARE_PROVIDER_SITE_OTHER): Payer: Medicare Other | Admitting: Podiatry

## 2023-03-06 DIAGNOSIS — M79675 Pain in left toe(s): Secondary | ICD-10-CM | POA: Diagnosis not present

## 2023-03-06 DIAGNOSIS — M79674 Pain in right toe(s): Secondary | ICD-10-CM

## 2023-03-06 DIAGNOSIS — E291 Testicular hypofunction: Secondary | ICD-10-CM | POA: Diagnosis not present

## 2023-03-06 DIAGNOSIS — B351 Tinea unguium: Secondary | ICD-10-CM

## 2023-03-06 DIAGNOSIS — E119 Type 2 diabetes mellitus without complications: Secondary | ICD-10-CM

## 2023-03-06 NOTE — Progress Notes (Signed)
This patient returns to my office for at risk foot care.  This patient requires this care by a professional since this patient will be at risk due to having  diabetes.This patient is unable to cut nails themselves since the patient cannot reach their nails.These nails are painful walking and wearing shoes.  This patient presents for at risk foot care today.  General Appearance  Alert, conversant and in no acute stress.  Vascular  Dorsalis pedis  are palpable  bilaterally. Posterior tibial pulses are not palpable. Capillary return is within normal limits  bilaterally. Temperature is within normal limits  bilaterally.  Neurologic  Senn-Weinstein monofilament wire test within normal limits  bilaterally. Muscle power within normal limits bilaterally.  Nails Thick disfigured discolored nails with subungual debris  from hallux to fifth toes bilaterally. No evidence of bacterial infection or drainage bilaterally.  Orthopedic  No limitations of motion  feet .  No crepitus or effusions noted.  No bony pathology or digital deformities noted.  Mild  HAV  B/L.  Skin  normotropic skin with no porokeratosis noted bilaterally.  No signs of infections or ulcers noted.     Onychomycosis  Pain in right toe  Pain in left toe.  Consent was obtained for treatment procedures.  Debridement and grinding of long thick nails with clearing of subungual debris.  No infection or ulcer.     Return office visit  3 months        Told patient to return for periodic foot care and evaluation due to potential at risk complications.   Marrie Chandra DPM  

## 2023-03-11 DIAGNOSIS — E876 Hypokalemia: Secondary | ICD-10-CM | POA: Diagnosis not present

## 2023-03-11 LAB — BASIC METABOLIC PANEL
BUN/Creatinine Ratio: 16 (ref 10–24)
BUN: 17 mg/dL (ref 8–27)
CO2: 22 mmol/L (ref 20–29)
Calcium: 9.6 mg/dL (ref 8.6–10.2)
Chloride: 98 mmol/L (ref 96–106)
Creatinine, Ser: 1.05 mg/dL (ref 0.76–1.27)
Glucose: 126 mg/dL — ABNORMAL HIGH (ref 70–99)
Potassium: 3.6 mmol/L (ref 3.5–5.2)
Sodium: 138 mmol/L (ref 134–144)
eGFR: 72 mL/min/{1.73_m2} (ref >59)

## 2023-03-14 ENCOUNTER — Telehealth: Payer: Self-pay

## 2023-03-14 DIAGNOSIS — I7121 Aneurysm of the ascending aorta, without rupture: Secondary | ICD-10-CM

## 2023-03-14 NOTE — Telephone Encounter (Signed)
-----   Message from Christell Constant sent at 03/14/2023  9:53 AM EST ----- Regarding: FW: Aortic dilation appears to be stable. There was motion artifact that likely led to the discrepancy between this and prior imaging. In one year it would be recommended to do a gated CT of the aorta to best define that anatomy. ----- Message ----- From: Interface, Rad Results In Sent: 03/08/2023   5:10 PM EST To: Christell Constant, MD

## 2023-03-14 NOTE — Telephone Encounter (Signed)
The patient has been notified of the result and verbalized understanding.  All questions (if any) were answered. Ethelda Chick, RN 03/14/2023 4:30 PM   Will place order for CT.

## 2023-03-21 DIAGNOSIS — I7121 Aneurysm of the ascending aorta, without rupture: Secondary | ICD-10-CM | POA: Diagnosis not present

## 2023-03-21 DIAGNOSIS — I4891 Unspecified atrial fibrillation: Secondary | ICD-10-CM | POA: Diagnosis not present

## 2023-04-03 DIAGNOSIS — E291 Testicular hypofunction: Secondary | ICD-10-CM | POA: Diagnosis not present

## 2023-04-24 DIAGNOSIS — E291 Testicular hypofunction: Secondary | ICD-10-CM | POA: Diagnosis not present

## 2023-05-06 IMAGING — CT CT ANGIO CHEST
3 of 6 series · 18 of 46 positions shown · IV contrast (OMNIPAQUE)
Comparison: None.

CLINICAL DATA: Aortic aneurysm.  Dyspnea on exertion.

EXAM:
CT ANGIOGRAPHY CHEST WITH CONTRAST
TECHNIQUE: Multidetector CT imaging of the chest was performed using the
standard protocol during bolus administration of intravenous
contrast. Multiplanar CT image reconstructions and MIPs were
obtained to evaluate the vascular anatomy.

[Series 4: axial arterial · axial · arterial · 0.91mm/px · z∈[-257,+61]mm · 12 of 126 slices shown]
[im 10/126  lung]
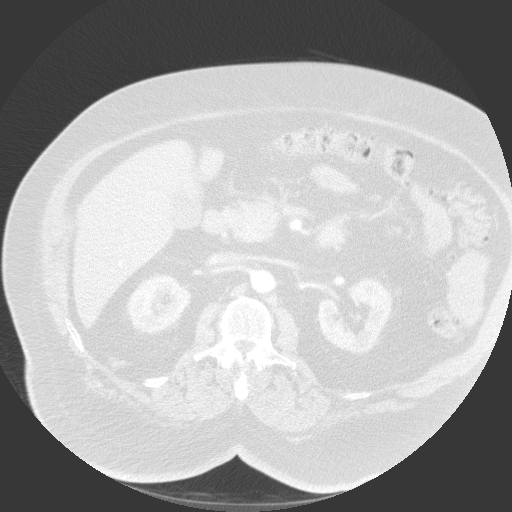
[im 20/126  soft-tissue]
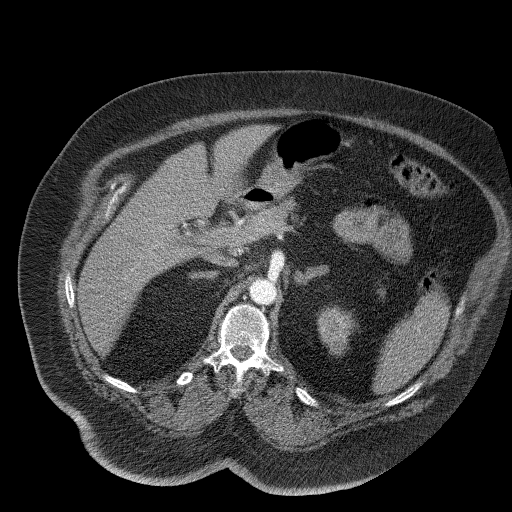
[im 29/126  lung]
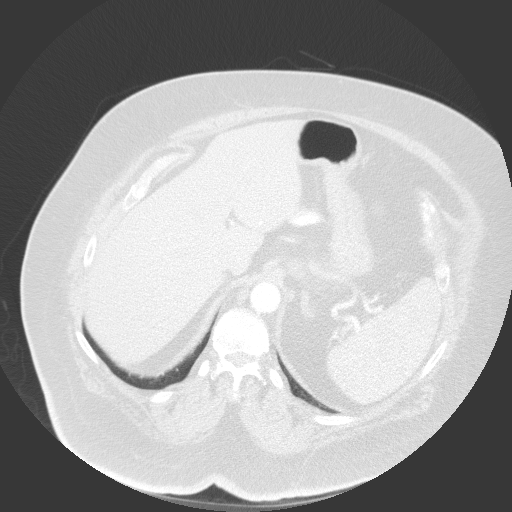
[im 39/126  soft-tissue]
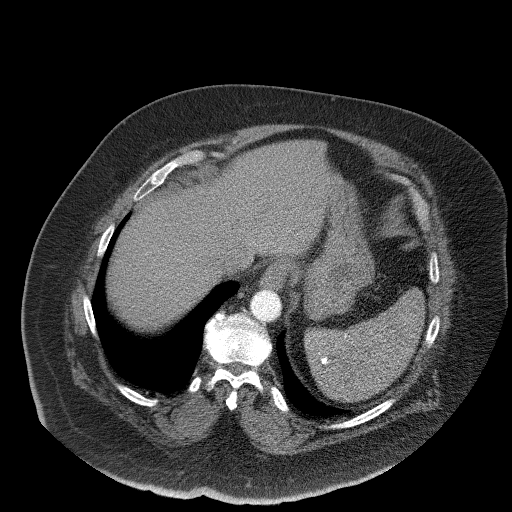
[im 49/126  lung]
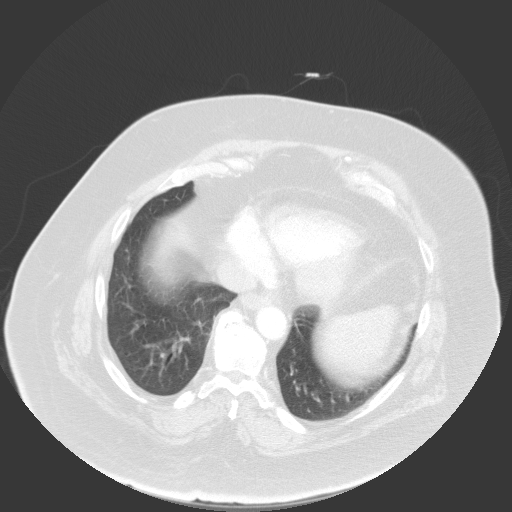
[im 58/126  soft-tissue]
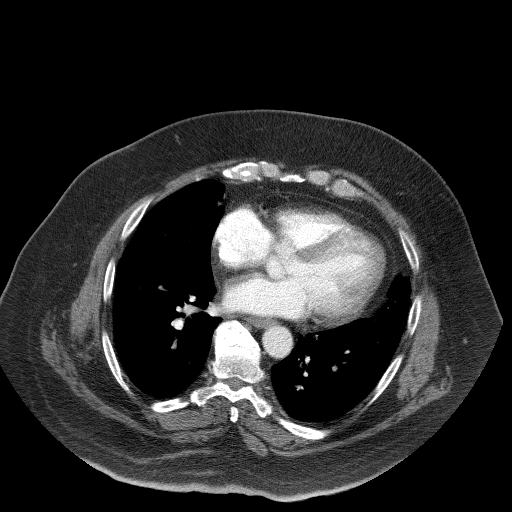
[im 68/126  lung]
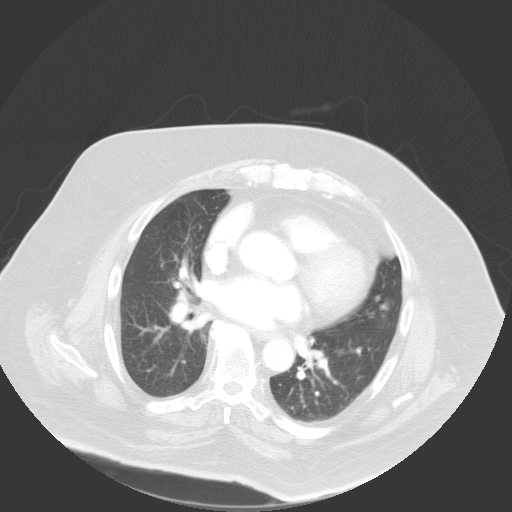
[im 77/126  soft-tissue]
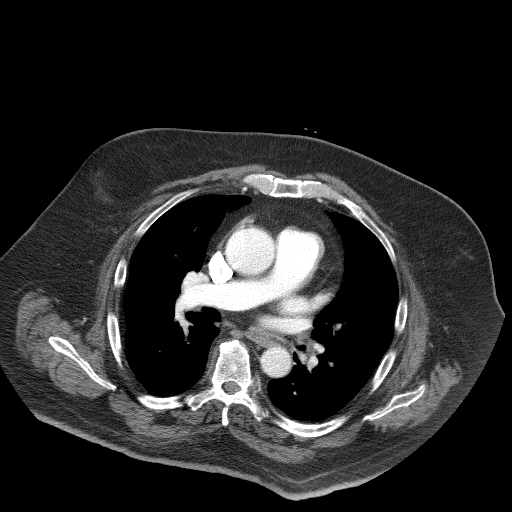
[im 87/126  lung]
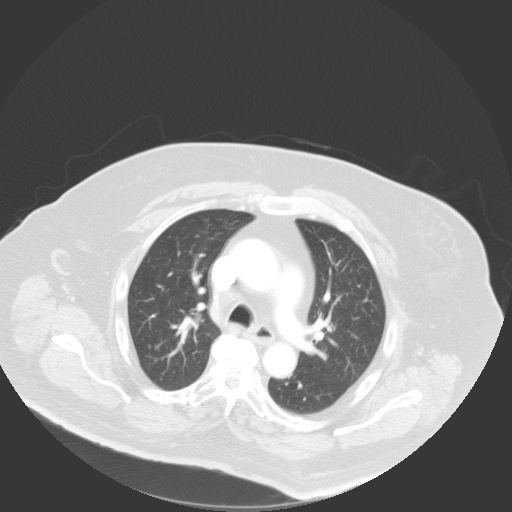
[im 97/126  soft-tissue]
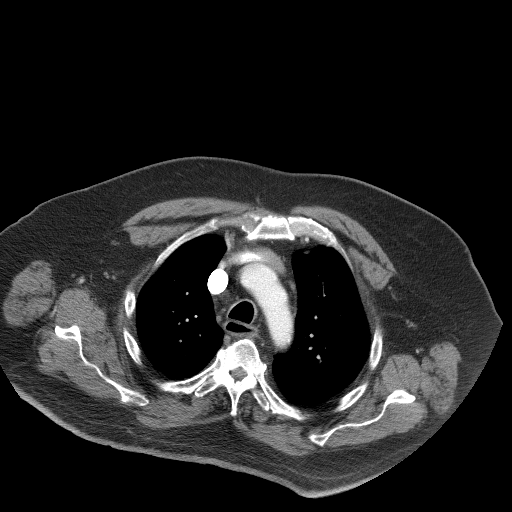
[im 106/126  lung]
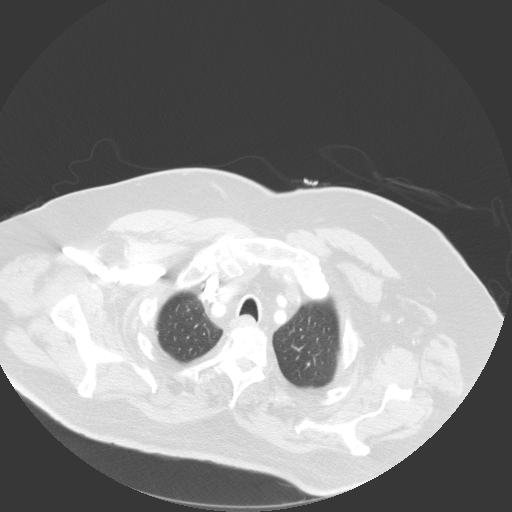
[im 116/126  soft-tissue]
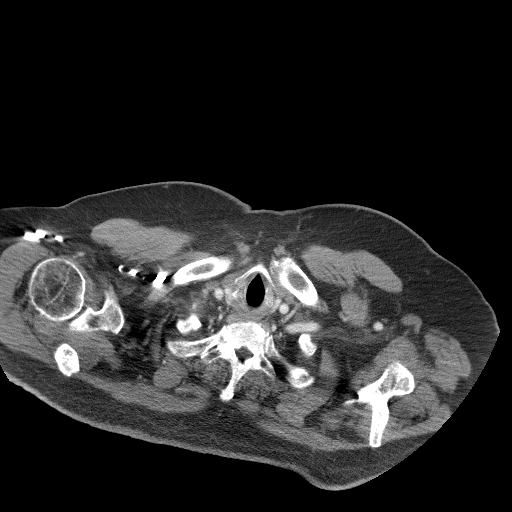

[Series 6: lung · axial · 0.91mm/px · z∈[-208,-136]mm · 3 of 156 slices shown]
[im 19/156  soft-tissue]
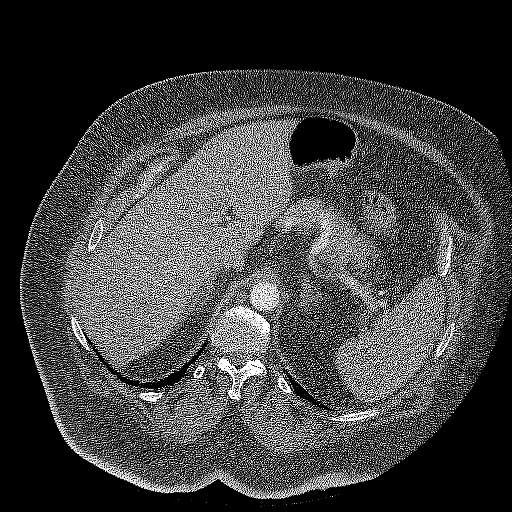
[im 37/156  soft-tissue]
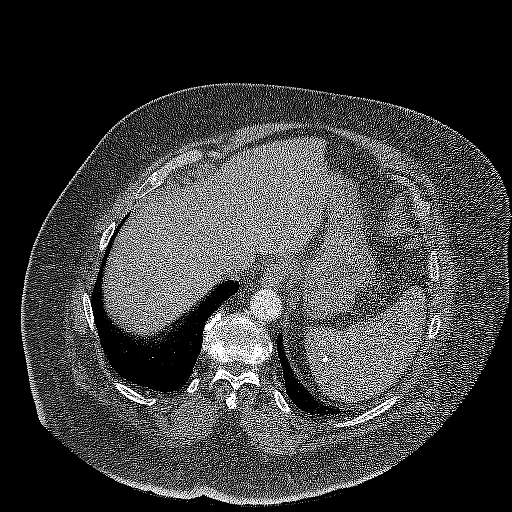
[im 55/156  soft-tissue]
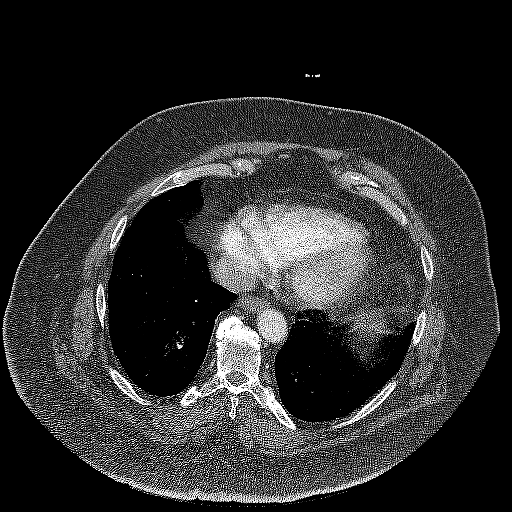

[Series 7: coronal · coronal · 0.85mm/px · 3 of 102 slices shown]
[im 26/102  soft-tissue]
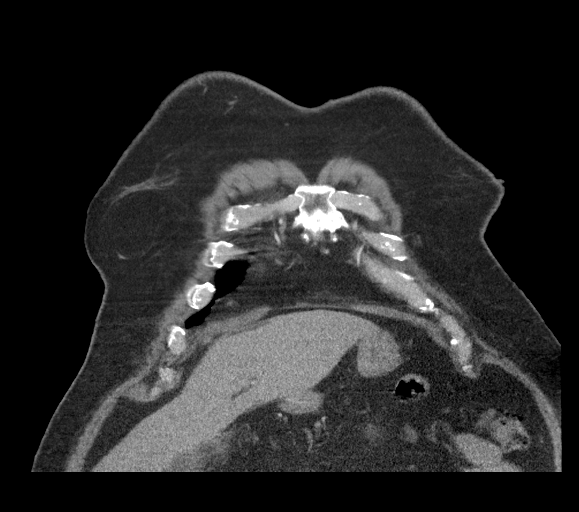
[im 51/102  soft-tissue]
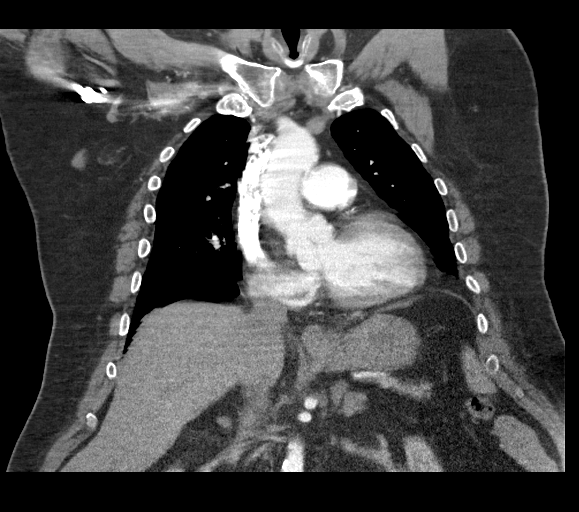
[im 76/102  soft-tissue]
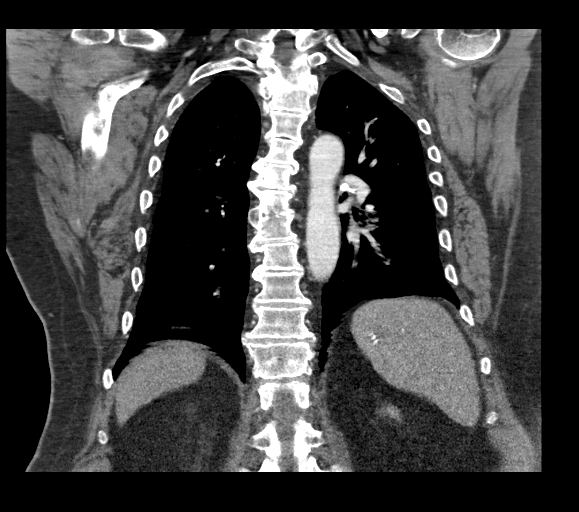

[18 of 46 positions shown; findings below may reference images not displayed]

RADIATION DOSE REDUCTION: This exam was performed according to the
departmental dose-optimization program which includes automated
exposure control, adjustment of the mA and/or kV according to
patient size and/or use of iterative reconstruction technique.

CONTRAST:  100mL OMNIPAQUE IOHEXOL 350 MG/ML SOLN
FINDINGS: Cardiovascular: 4.6 cm ascending thoracic aortic aneurysm is noted.
No dissection is noted. Atherosclerosis of thoracic aorta is noted.
Mild cardiomegaly is noted. No pericardial effusion is noted.

Mediastinum/Nodes: No enlarged mediastinal, hilar, or axillary lymph
nodes. Thyroid gland, trachea, and esophagus demonstrate no
significant findings.

Lungs/Pleura: No pneumothorax or pleural effusion is noted. 5 mm
subpleural nodule is noted in left lower lobe best seen on image
number 79 of series 6.

Upper Abdomen: No acute abnormality.

Musculoskeletal: No chest wall abnormality. No acute or significant
osseous findings.

Review of the MIP images confirms the above findings.
IMPRESSION: 4.6 cm ascending thoracic aortic aneurysm. Recommend semi-annual
imaging followup by CTA or MRA and referral to cardiothoracic
surgery if not already obtained. This recommendation follows 9707
ACCF/AHA/AATS/ACR/ASA/SCA/KURTIS/IL/ANALUISA/MALBILA Guidelines for the
Diagnosis and Management of Patients With Thoracic Aortic Disease.
Circulation. 9707; 121: [id number]-e369. Aortic aneurysm NOS (QNSDK-NPB.8)
).

5 mm subpleural nodule seen in left lower lobe. No follow-up needed
if patient is low-risk.This recommendation follows the consensus
statement: Guidelines for Management of Incidental Pulmonary Nodules
Detected on CT Images: From the [HOSPITAL] 8472; Radiology

Aortic Atherosclerosis (QNSDK-MTV.V).

## 2023-05-15 DIAGNOSIS — E785 Hyperlipidemia, unspecified: Secondary | ICD-10-CM | POA: Diagnosis not present

## 2023-05-15 DIAGNOSIS — I10 Essential (primary) hypertension: Secondary | ICD-10-CM | POA: Diagnosis not present

## 2023-05-15 DIAGNOSIS — E291 Testicular hypofunction: Secondary | ICD-10-CM | POA: Diagnosis not present

## 2023-05-30 ENCOUNTER — Other Ambulatory Visit: Payer: Self-pay | Admitting: Physician Assistant

## 2023-06-04 ENCOUNTER — Encounter: Payer: Self-pay | Admitting: Podiatry

## 2023-06-04 ENCOUNTER — Ambulatory Visit (INDEPENDENT_AMBULATORY_CARE_PROVIDER_SITE_OTHER): Payer: Medicare Other | Admitting: Podiatry

## 2023-06-04 DIAGNOSIS — B351 Tinea unguium: Secondary | ICD-10-CM

## 2023-06-04 DIAGNOSIS — M79674 Pain in right toe(s): Secondary | ICD-10-CM | POA: Diagnosis not present

## 2023-06-04 DIAGNOSIS — E119 Type 2 diabetes mellitus without complications: Secondary | ICD-10-CM

## 2023-06-04 DIAGNOSIS — M79675 Pain in left toe(s): Secondary | ICD-10-CM | POA: Diagnosis not present

## 2023-06-04 NOTE — Progress Notes (Signed)
This patient returns to my office for at risk foot care.  This patient requires this care by a professional since this patient will be at risk due to having  diabetes.This patient is unable to cut nails themselves since the patient cannot reach their nails.These nails are painful walking and wearing shoes.  This patient presents for at risk foot care today.  General Appearance  Alert, conversant and in no acute stress.  Vascular  Dorsalis pedis  are palpable  bilaterally. Posterior tibial pulses are not palpable. Capillary return is within normal limits  bilaterally. Temperature is within normal limits  bilaterally.  Neurologic  Senn-Weinstein monofilament wire test within normal limits  bilaterally. Muscle power within normal limits bilaterally.  Nails Thick disfigured discolored nails with subungual debris  from hallux to fifth toes bilaterally. No evidence of bacterial infection or drainage bilaterally.  Orthopedic  No limitations of motion  feet .  No crepitus or effusions noted.  No bony pathology or digital deformities noted.  Mild  HAV  B/L.  Skin  normotropic skin with no porokeratosis noted bilaterally.  No signs of infections or ulcers noted.     Onychomycosis  Pain in right toe  Pain in left toe.  Consent was obtained for treatment procedures.  Debridement and grinding of long thick nails with clearing of subungual debris.  No infection or ulcer.     Return office visit  3 months        Told patient to return for periodic foot care and evaluation due to potential at risk complications.   Marrie Chandra DPM  

## 2023-06-05 ENCOUNTER — Other Ambulatory Visit: Payer: Self-pay | Admitting: Physician Assistant

## 2023-06-05 ENCOUNTER — Other Ambulatory Visit: Payer: Self-pay | Admitting: Internal Medicine

## 2023-06-05 DIAGNOSIS — E291 Testicular hypofunction: Secondary | ICD-10-CM | POA: Diagnosis not present

## 2023-06-15 ENCOUNTER — Other Ambulatory Visit: Payer: Self-pay | Admitting: Internal Medicine

## 2023-06-26 DIAGNOSIS — E291 Testicular hypofunction: Secondary | ICD-10-CM | POA: Diagnosis not present

## 2023-07-09 ENCOUNTER — Ambulatory Visit: Payer: Medicare Other | Admitting: Pulmonary Disease

## 2023-07-09 ENCOUNTER — Encounter: Payer: Self-pay | Admitting: Pulmonary Disease

## 2023-07-09 VITALS — BP 105/54 | HR 67 | Ht 71.0 in | Wt 334.0 lb

## 2023-07-09 DIAGNOSIS — R0609 Other forms of dyspnea: Secondary | ICD-10-CM

## 2023-07-09 DIAGNOSIS — G4733 Obstructive sleep apnea (adult) (pediatric): Secondary | ICD-10-CM

## 2023-07-09 MED ORDER — STIOLTO RESPIMAT 2.5-2.5 MCG/ACT IN AERS: 2.0000 | INHALATION_SPRAY | Freq: Every day | RESPIRATORY_TRACT | 11 refills | Status: AC

## 2023-07-09 NOTE — Progress Notes (Signed)
 @Patient  ID: Benjamin Shepard, male    DOB: 1945-07-27, 78 y.o.   MRN: [id number]  Chief Complaint  Patient presents with   Follow-up    Pt states SOB first getting up in am and or when he sits for sometime, Rx refills     Referring provider: Alysia Penna, MD  HPI:   78 y.o. man whom we are seeing for evaluation of dyspnea on exertion.  Most recent cardiology note reviewed.  Returns for planned follow-up.  Overall doing well.  Stable.  Using [employer].  He finds this beneficial.  Good adherence.  CPAP compliance reviewed, excellent urines.  Described below.  HPI at initial visit: Patient notes shortness of breath for some time now.  Months to years.  May be slightly progressing over time.  Worse on inclines or stairs but even on flat surfaces over a short distance he becomes so winded need to sit, rest.  With rest things improved.  No time of day when things are better or worse.  No position to make things better or worse.  Maybe a little worse with seasonal changes he thinks, heavy or hot air.  No other alleviating or exacerbating factors.  Not using inhalers.  Chest imaging is clear, CT scan 12/2021 personally reviewed interpret as clear lungs bilaterally, it is over timeframe of similar symptoms.  Scan 06/2021 also personally reviewed interpret as clear lungs bilaterally.  In 2017 he had a left heart catheterization that revealed no significant coronary disease but did have a LVEDP of 19.  He has atrial fibrillation.  On diltiazem for rate control.  A-fib was described as paroxysmal.  TTE in 2023 relatively reassuring.   Questionaires / Pulmonary Flowsheets:   ACT:      No data to display          MMRC:     No data to display          Epworth:      No data to display          Tests:   FENO:  No results found for: "NITRICOXIDE"  PFT:    Latest Ref Rng & Units 10/16/2022    3:37 PM  PFT Results  FVC-Pre L 2.59   FVC-Predicted Pre % 60   FVC-Post L 2.56    FVC-Predicted Post % 59   Pre FEV1/FVC % % 79   Post FEV1/FCV % % 83   FEV1-Pre L 2.06   FEV1-Predicted Pre % 66   FEV1-Post L 2.11   DLCO uncorrected ml/min/mmHg 30.06   DLCO UNC% % 118   DLCO corrected ml/min/mmHg 30.06   DLCO COR %Predicted % 118   DLVA Predicted % 158   TLC L 5.39   TLC % Predicted % 74   RV % Predicted % 104   Personally reviewed interpreted spirometry just above restriction versus air trapping.  Lung volumes reveal mild restriction with severely low ERV, air trapping.  DLCO within normal limits.  WALK:      No data to display          Imaging: Personally reviewed and as per EMR discussion this note No results found.  Lab Results: Personally reviewed CBC    Component Value Date/Time   WBC 7.6 05/25/2021 1245   WBC 7.4 05/22/2015 1350   RBC 4.71 05/25/2021 1245   RBC 4.47 05/22/2015 1350   HGB 14.2 05/25/2021 1245   HCT 42.8 05/25/2021 1245   PLT 201 05/25/2021 1245   MCV  91 05/25/2021 1245   MCH 30.1 05/25/2021 1245   MCH 30.2 05/22/2015 1350   MCHC 33.2 05/25/2021 1245   MCHC 32.4 05/22/2015 1350   RDW 12.4 05/25/2021 1245   LYMPHSABS 1.4 04/12/2015 1328   MONOABS 0.5 04/12/2015 1328   EOSABS 0.0 04/12/2015 1328   BASOSABS 0.0 04/12/2015 1328    BMET    Component Value Date/Time   NA 138 03/11/2023 1048   K 3.6 03/11/2023 1048   CL 98 03/11/2023 1048   CO2 22 03/11/2023 1048   GLUCOSE 126 (H) 03/11/2023 1048   GLUCOSE 111 (H) 06/01/2015 0440   BUN 17 03/11/2023 1048   CREATININE 1.05 03/11/2023 1048   CREATININE 0.84 04/12/2015 1328   CALCIUM 9.6 03/11/2023 1048   GFRNONAA 83 03/13/2020 1002   GFRAA 96 03/13/2020 1002    BNP No results found for: "BNP"  ProBNP    Component Value Date/Time   PROBNP 422 05/25/2021 1245    Specialty Problems       Pulmonary Problems   OSA (obstructive sleep apnea)   Dyspnea    Allergies  Allergen Reactions   Levaquin [Levofloxacin]     TAA   Shrimp [Shellfish Allergy]      "throat swelling, SOB"   Other Rash    Muscle relaxer    Immunization History  Administered Date(s) Administered   Influenza Split 02/04/2013, 02/08/2014   Influenza, High Dose Seasonal PF 02/08/2014, 02/05/2016   Influenza, Quadrivalent, Recombinant, Inj, Pf 02/18/2018, 12/04/2018, 12/22/2019, 01/01/2021   Pneumococcal Conjugate-13 05/26/2013, 12/08/2013, 07/06/2015   Td 07/10/2016   Tdap 05/26/2013   Zoster, Live 04/02/2011    Past Medical History:  Diagnosis Date   Anxiety    Back pain    BPH (benign prostatic hyperplasia)    Diabetes (HCC)    Edema    Edema of both lower extremities    Foley catheter in place    Food allergy    Gout    HTN (hypertension)    Hyperlipidemia    Joint pain    OSA (obstructive sleep apnea)    USES C-PAP   Psoriasis    Shortness of breath dyspnea    WITH EXERTION   Urinary retention     Tobacco History: Social History   Tobacco Use  Smoking Status Former   Current packs/day: 0.00   Types: Cigarettes   Quit date: 03/15/1998   Years since quitting: 25.3  Smokeless Tobacco Never   Counseling given: Not Answered   Continue to not smoke  Outpatient Encounter Medications as of 07/09/2023  Medication Sig   allopurinol (ZYLOPRIM) 100 MG tablet Take 100 mg by mouth daily.   apixaban (ELIQUIS) 5 MG TABS tablet Take 1 tablet (5 mg total) by mouth 2 (two) times daily.   atorvastatin (LIPITOR) 20 MG tablet Take 20 mg by mouth daily.   colchicine 0.6 MG tablet Take 0.6 mg by mouth daily as needed (gout).    diltiazem (TIAZAC) 360 MG 24 hr capsule TAKE 1 CAPSULE(360 MG) BY MOUTH DAILY   doxazosin (CARDURA) 8 MG tablet TAKE 2 TABLETS(16 MG) BY MOUTH DAILY   escitalopram (LEXAPRO) 10 MG tablet Take 10 mg by mouth daily.   furosemide (LASIX) 20 MG tablet Take 20 mg by mouth daily as needed (leg swelling).   hydrochlorothiazide (HYDRODIURIL) 25 MG tablet TAKE 1 TABLET(25 MG) BY MOUTH DAILY   Lancets (ONETOUCH ULTRASOFT) lancets Test BS twice  a day.  ICS9-250.00   losartan (COZAAR) 100 MG tablet  TAKE 1 TABLET(100 MG) BY MOUTH DAILY   metFORMIN (GLUCOPHAGE) 500 MG tablet Take 500 mg by mouth daily with breakfast.    oxyCODONE-acetaminophen (PERCOCET/ROXICET) 5-325 MG tablet Take 1-2 tablets by mouth every 4 to 6 hours as needed for pain.   potassium chloride SA (KLOR-CON M20) 20 MEQ tablet Take 1 tablet (20 mEq total) by mouth daily.   testosterone cypionate (DEPOTESTOSTERONE CYPIONATE) 200 MG/ML injection every 21 ( twenty-one) days.   [DISCONTINUED] Tiotropium Bromide-Olodaterol (STIOLTO RESPIMAT) 2.5-2.5 MCG/ACT AERS Inhale 2 puffs into the lungs daily.   Tiotropium Bromide-Olodaterol (STIOLTO RESPIMAT) 2.5-2.5 MCG/ACT AERS Inhale 2 puffs into the lungs daily.   No facility-administered encounter medications on file as of 07/09/2023.     Review of Systems  Review of Systems  N/a Physical Exam  BP (!) 105/54 (BP Location: Left Arm, Patient Position: Sitting, Cuff Size: Normal)   Pulse 67   Ht 5\' 11"  (1.803 m)   Wt (!) 334 lb (151.5 kg)   SpO2 93%   BMI 46.58 kg/m   Wt Readings from Last 5 Encounters:  07/09/23 (!) 334 lb (151.5 kg)  02/21/23 (!) 338 lb (153.3 kg)  10/17/22 (!) 338 lb (153.3 kg)  08/16/22 (!) 320 lb (145.2 kg)  06/17/22 (!) 338 lb (153.3 kg)    BMI Readings from Last 5 Encounters:  07/09/23 46.58 kg/m  02/21/23 44.59 kg/m  10/17/22 47.14 kg/m  08/16/22 45.92 kg/m  06/17/22 48.50 kg/m     Physical Exam General: Sitting in chair, no acute distress Eyes: EOMI, no icterus Neck: Supple, no JVP Pulmonary: Clear, normal work of breathing Cardiovascular: Warm, regular rate and rhythm, no murmur Abdomen: Nondistended, bowel sounds present MSK: No synovitis, no joint effusion Neuro: Normal gait, no weakness Psych: Normal mood, full affect   Assessment & Plan:   Dyspnea on exertion: Suspect multifactorial related to habitus, deconditioning, cardiac causes given atrial fibrillation-whether  tachycardia induced or chronotropic insufficiency in the setting of diltiazem-as well as evidence of elevated LVEDP on catheterization in 2017 likely worsening with exertion.  CT of the chest is clear.  Asthma could be possible.  He has adverse reactions to steroids in the past.  Prescribe Stiolto 07/2022 with mild improvement.  PFT 09/2022 reveal air trapping as well as mild restriction related to habitus in the setting of low ERV and normal DLCO.  Continue Stiolto-refilled today, had mild adverse reaction with lightheadedness after albuterol during PFT.  Recommend we avoid this for now.  OSA on CPAP: Compliance report reviewed.  Excellent adherence.  Using 7 and half hours a night on average.  AHI 2.6.  He is clinically benefiting from CPAP therapy.  He is encouraged to continue nightly CPAP therapy.   Return in about 1 year (around 07/08/2024) for f/u Dr. Judeth Horn.   Karren Burly, MD 07/09/2023

## 2023-07-09 NOTE — Patient Instructions (Signed)
 Nice to see you again  I refilled the Stiolto, 2 puffs once a day  You are welcome to message or call and ask we have samples, if we do a happy 3 to have them, currently we do not have any.  Your CPAP report looks great, continue to use CPAP nightly as you are.  Return to clinic in 1 year or sooner if needed with Dr. Judeth Horn

## 2023-07-22 DIAGNOSIS — I10 Essential (primary) hypertension: Secondary | ICD-10-CM | POA: Diagnosis not present

## 2023-07-22 DIAGNOSIS — D692 Other nonthrombocytopenic purpura: Secondary | ICD-10-CM | POA: Diagnosis not present

## 2023-07-22 DIAGNOSIS — E785 Hyperlipidemia, unspecified: Secondary | ICD-10-CM | POA: Diagnosis not present

## 2023-07-22 DIAGNOSIS — E291 Testicular hypofunction: Secondary | ICD-10-CM | POA: Diagnosis not present

## 2023-07-22 DIAGNOSIS — E1121 Type 2 diabetes mellitus with diabetic nephropathy: Secondary | ICD-10-CM | POA: Diagnosis not present

## 2023-07-22 DIAGNOSIS — D6859 Other primary thrombophilia: Secondary | ICD-10-CM | POA: Diagnosis not present

## 2023-07-22 DIAGNOSIS — M109 Gout, unspecified: Secondary | ICD-10-CM | POA: Diagnosis not present

## 2023-07-22 DIAGNOSIS — Z23 Encounter for immunization: Secondary | ICD-10-CM | POA: Diagnosis not present

## 2023-07-22 DIAGNOSIS — I4891 Unspecified atrial fibrillation: Secondary | ICD-10-CM | POA: Diagnosis not present

## 2023-07-22 DIAGNOSIS — I714 Abdominal aortic aneurysm, without rupture, unspecified: Secondary | ICD-10-CM | POA: Diagnosis not present

## 2023-07-22 DIAGNOSIS — N401 Enlarged prostate with lower urinary tract symptoms: Secondary | ICD-10-CM | POA: Diagnosis not present

## 2023-08-01 ENCOUNTER — Other Ambulatory Visit: Payer: Self-pay | Admitting: Internal Medicine

## 2023-08-09 ENCOUNTER — Other Ambulatory Visit: Payer: Self-pay | Admitting: Internal Medicine

## 2023-08-09 DIAGNOSIS — I4891 Unspecified atrial fibrillation: Secondary | ICD-10-CM

## 2023-08-11 NOTE — Telephone Encounter (Signed)
 Prescription refill request for Eliquis  received. Indication: Last office visit: 02/21/23  Kimball Pence MD Scr: 1.05 on 03/11/23  Epic Age: 78 Weight: 153.3kg  Based on above findings Eliquis  5mg  twice daily is the appropriate dose.  Refill approved.

## 2023-08-14 DIAGNOSIS — E291 Testicular hypofunction: Secondary | ICD-10-CM | POA: Diagnosis not present

## 2023-09-03 ENCOUNTER — Encounter: Payer: Self-pay | Admitting: Podiatry

## 2023-09-03 ENCOUNTER — Ambulatory Visit (INDEPENDENT_AMBULATORY_CARE_PROVIDER_SITE_OTHER): Admitting: Podiatry

## 2023-09-03 DIAGNOSIS — E119 Type 2 diabetes mellitus without complications: Secondary | ICD-10-CM

## 2023-09-03 DIAGNOSIS — M79675 Pain in left toe(s): Secondary | ICD-10-CM

## 2023-09-03 DIAGNOSIS — M79674 Pain in right toe(s): Secondary | ICD-10-CM

## 2023-09-03 DIAGNOSIS — B351 Tinea unguium: Secondary | ICD-10-CM | POA: Diagnosis not present

## 2023-09-03 NOTE — Progress Notes (Signed)
This patient returns to my office for at risk foot care.  This patient requires this care by a professional since this patient will be at risk due to having  diabetes.This patient is unable to cut nails themselves since the patient cannot reach their nails.These nails are painful walking and wearing shoes.  This patient presents for at risk foot care today.  General Appearance  Alert, conversant and in no acute stress.  Vascular  Dorsalis pedis  are palpable  bilaterally. Posterior tibial pulses are not palpable. Capillary return is within normal limits  bilaterally. Temperature is within normal limits  bilaterally.  Neurologic  Senn-Weinstein monofilament wire test within normal limits  bilaterally. Muscle power within normal limits bilaterally.  Nails Thick disfigured discolored nails with subungual debris  from hallux to fifth toes bilaterally. No evidence of bacterial infection or drainage bilaterally.  Orthopedic  No limitations of motion  feet .  No crepitus or effusions noted.  No bony pathology or digital deformities noted.  Mild  HAV  B/L.  Skin  normotropic skin with no porokeratosis noted bilaterally.  No signs of infections or ulcers noted.     Onychomycosis  Pain in right toe  Pain in left toe.  Consent was obtained for treatment procedures.  Debridement and grinding of long thick nails with clearing of subungual debris.  No infection or ulcer.     Return office visit  3 months        Told patient to return for periodic foot care and evaluation due to potential at risk complications.   Marrie Chandra DPM  

## 2023-09-04 DIAGNOSIS — E291 Testicular hypofunction: Secondary | ICD-10-CM | POA: Diagnosis not present

## 2023-09-25 DIAGNOSIS — E291 Testicular hypofunction: Secondary | ICD-10-CM | POA: Diagnosis not present

## 2023-10-16 DIAGNOSIS — E291 Testicular hypofunction: Secondary | ICD-10-CM | POA: Diagnosis not present

## 2023-10-29 DIAGNOSIS — E785 Hyperlipidemia, unspecified: Secondary | ICD-10-CM | POA: Diagnosis not present

## 2023-10-29 DIAGNOSIS — M109 Gout, unspecified: Secondary | ICD-10-CM | POA: Diagnosis not present

## 2023-10-29 DIAGNOSIS — E291 Testicular hypofunction: Secondary | ICD-10-CM | POA: Diagnosis not present

## 2023-10-29 DIAGNOSIS — N401 Enlarged prostate with lower urinary tract symptoms: Secondary | ICD-10-CM | POA: Diagnosis not present

## 2023-10-29 DIAGNOSIS — D6859 Other primary thrombophilia: Secondary | ICD-10-CM | POA: Diagnosis not present

## 2023-10-29 DIAGNOSIS — I10 Essential (primary) hypertension: Secondary | ICD-10-CM | POA: Diagnosis not present

## 2023-10-29 DIAGNOSIS — D692 Other nonthrombocytopenic purpura: Secondary | ICD-10-CM | POA: Diagnosis not present

## 2023-10-29 DIAGNOSIS — I714 Abdominal aortic aneurysm, without rupture, unspecified: Secondary | ICD-10-CM | POA: Diagnosis not present

## 2023-10-29 DIAGNOSIS — I4891 Unspecified atrial fibrillation: Secondary | ICD-10-CM | POA: Diagnosis not present

## 2023-10-29 DIAGNOSIS — Z6841 Body Mass Index (BMI) 40.0 and over, adult: Secondary | ICD-10-CM | POA: Diagnosis not present

## 2023-10-29 DIAGNOSIS — E1121 Type 2 diabetes mellitus with diabetic nephropathy: Secondary | ICD-10-CM | POA: Diagnosis not present

## 2023-11-06 DIAGNOSIS — E291 Testicular hypofunction: Secondary | ICD-10-CM | POA: Diagnosis not present

## 2023-11-20 DIAGNOSIS — E291 Testicular hypofunction: Secondary | ICD-10-CM | POA: Diagnosis not present

## 2023-12-04 ENCOUNTER — Ambulatory Visit: Admitting: Podiatry

## 2023-12-15 ENCOUNTER — Encounter: Payer: Self-pay | Admitting: Podiatry

## 2023-12-15 ENCOUNTER — Ambulatory Visit (INDEPENDENT_AMBULATORY_CARE_PROVIDER_SITE_OTHER): Admitting: Podiatry

## 2023-12-15 DIAGNOSIS — M79675 Pain in left toe(s): Secondary | ICD-10-CM

## 2023-12-15 DIAGNOSIS — M79674 Pain in right toe(s): Secondary | ICD-10-CM

## 2023-12-15 DIAGNOSIS — E119 Type 2 diabetes mellitus without complications: Secondary | ICD-10-CM

## 2023-12-15 DIAGNOSIS — B351 Tinea unguium: Secondary | ICD-10-CM

## 2023-12-15 NOTE — Progress Notes (Addendum)
 This patient returns to my office for at risk foot care.  This patient requires this care by a professional since this patient will be at risk due to having  diabetes.This patient is unable to cut nails themselves since the patient cannot reach their nails.These nails are painful walking and wearing shoes.  This patient presents for at risk foot care today.  General Appearance  Alert, conversant and in no acute stress.  Vascular  Dorsalis pedis  are palpable  bilaterally. Posterior tibial pulses are not palpable. Capillary return is within normal limits  bilaterally. Temperature is within normal limits  bilaterally.  Neurologic  Senn-Weinstein monofilament wire test within normal limits  bilaterally. Muscle power within normal limits bilaterally.  Nails Thick disfigured discolored nails with subungual debris  from hallux to fifth toes bilaterally. No evidence of bacterial infection or drainage bilaterally.  Orthopedic  No limitations of motion  feet .  No crepitus or effusions noted.  No bony pathology or digital deformities noted.  Mild  HAV  B/L.  Skin  normotropic skin with no porokeratosis noted bilaterally.  No signs of infections or ulcers noted.     Onychomycosis  Pain in right toe  Pain in left toe.  Consent was obtained for treatment procedures.  Debridement and grinding of long thick nails with clearing of subungual debris.  No infection or ulcer.     Return office visit  3 months        Told patient to return for periodic foot care and evaluation due to potential at risk complications.   Cordella Bold DPM tomma

## 2023-12-18 DIAGNOSIS — E291 Testicular hypofunction: Secondary | ICD-10-CM | POA: Diagnosis not present

## 2024-01-01 DIAGNOSIS — E291 Testicular hypofunction: Secondary | ICD-10-CM | POA: Diagnosis not present

## 2024-01-12 DIAGNOSIS — H5211 Myopia, right eye: Secondary | ICD-10-CM | POA: Diagnosis not present

## 2024-01-12 DIAGNOSIS — H18593 Other hereditary corneal dystrophies, bilateral: Secondary | ICD-10-CM | POA: Diagnosis not present

## 2024-01-12 DIAGNOSIS — I10 Essential (primary) hypertension: Secondary | ICD-10-CM | POA: Diagnosis not present

## 2024-01-12 DIAGNOSIS — H43393 Other vitreous opacities, bilateral: Secondary | ICD-10-CM | POA: Diagnosis not present

## 2024-01-12 DIAGNOSIS — H53143 Visual discomfort, bilateral: Secondary | ICD-10-CM | POA: Diagnosis not present

## 2024-01-12 DIAGNOSIS — H35373 Puckering of macula, bilateral: Secondary | ICD-10-CM | POA: Diagnosis not present

## 2024-01-12 DIAGNOSIS — H35033 Hypertensive retinopathy, bilateral: Secondary | ICD-10-CM | POA: Diagnosis not present

## 2024-01-12 DIAGNOSIS — E119 Type 2 diabetes mellitus without complications: Secondary | ICD-10-CM | POA: Diagnosis not present

## 2024-01-12 DIAGNOSIS — H524 Presbyopia: Secondary | ICD-10-CM | POA: Diagnosis not present

## 2024-01-15 DIAGNOSIS — E291 Testicular hypofunction: Secondary | ICD-10-CM | POA: Diagnosis not present

## 2024-01-17 DIAGNOSIS — L02219 Cutaneous abscess of trunk, unspecified: Secondary | ICD-10-CM | POA: Diagnosis not present

## 2024-01-20 DIAGNOSIS — Z125 Encounter for screening for malignant neoplasm of prostate: Secondary | ICD-10-CM | POA: Diagnosis not present

## 2024-01-20 DIAGNOSIS — Z48817 Encounter for surgical aftercare following surgery on the skin and subcutaneous tissue: Secondary | ICD-10-CM | POA: Diagnosis not present

## 2024-01-20 DIAGNOSIS — E291 Testicular hypofunction: Secondary | ICD-10-CM | POA: Diagnosis not present

## 2024-01-20 DIAGNOSIS — R5383 Other fatigue: Secondary | ICD-10-CM | POA: Diagnosis not present

## 2024-01-20 DIAGNOSIS — M109 Gout, unspecified: Secondary | ICD-10-CM | POA: Diagnosis not present

## 2024-01-20 DIAGNOSIS — E785 Hyperlipidemia, unspecified: Secondary | ICD-10-CM | POA: Diagnosis not present

## 2024-01-20 DIAGNOSIS — E1121 Type 2 diabetes mellitus with diabetic nephropathy: Secondary | ICD-10-CM | POA: Diagnosis not present

## 2024-01-20 DIAGNOSIS — Z0189 Encounter for other specified special examinations: Secondary | ICD-10-CM | POA: Diagnosis not present

## 2024-01-20 DIAGNOSIS — I10 Essential (primary) hypertension: Secondary | ICD-10-CM | POA: Diagnosis not present

## 2024-01-20 LAB — LAB REPORT - SCANNED
A1c: 5.2
Albumin, Urine POC: 3
Creatinine, POC: 20.9 mg/dL
EGFR: 71.9
Microalb Creat Ratio: 14
PSA, Total: 1
TSH: 2.26 (ref 0.41–5.90)

## 2024-01-27 DIAGNOSIS — D6859 Other primary thrombophilia: Secondary | ICD-10-CM | POA: Diagnosis not present

## 2024-01-27 DIAGNOSIS — D692 Other nonthrombocytopenic purpura: Secondary | ICD-10-CM | POA: Diagnosis not present

## 2024-01-27 DIAGNOSIS — Z6841 Body Mass Index (BMI) 40.0 and over, adult: Secondary | ICD-10-CM | POA: Diagnosis not present

## 2024-01-27 DIAGNOSIS — E785 Hyperlipidemia, unspecified: Secondary | ICD-10-CM | POA: Diagnosis not present

## 2024-01-27 DIAGNOSIS — Z1339 Encounter for screening examination for other mental health and behavioral disorders: Secondary | ICD-10-CM | POA: Diagnosis not present

## 2024-01-27 DIAGNOSIS — Z1331 Encounter for screening for depression: Secondary | ICD-10-CM | POA: Diagnosis not present

## 2024-01-27 DIAGNOSIS — E1121 Type 2 diabetes mellitus with diabetic nephropathy: Secondary | ICD-10-CM | POA: Diagnosis not present

## 2024-01-27 DIAGNOSIS — I10 Essential (primary) hypertension: Secondary | ICD-10-CM | POA: Diagnosis not present

## 2024-01-27 DIAGNOSIS — I4891 Unspecified atrial fibrillation: Secondary | ICD-10-CM | POA: Diagnosis not present

## 2024-01-27 DIAGNOSIS — Z23 Encounter for immunization: Secondary | ICD-10-CM | POA: Diagnosis not present

## 2024-01-27 DIAGNOSIS — Z0000 Encounter for general adult medical examination without abnormal findings: Secondary | ICD-10-CM | POA: Diagnosis not present

## 2024-01-27 DIAGNOSIS — I714 Abdominal aortic aneurysm, without rupture, unspecified: Secondary | ICD-10-CM | POA: Diagnosis not present

## 2024-01-27 DIAGNOSIS — N401 Enlarged prostate with lower urinary tract symptoms: Secondary | ICD-10-CM | POA: Diagnosis not present

## 2024-01-27 DIAGNOSIS — M109 Gout, unspecified: Secondary | ICD-10-CM | POA: Diagnosis not present

## 2024-01-27 DIAGNOSIS — E291 Testicular hypofunction: Secondary | ICD-10-CM | POA: Diagnosis not present

## 2024-01-29 DIAGNOSIS — L720 Epidermal cyst: Secondary | ICD-10-CM | POA: Diagnosis not present

## 2024-01-29 DIAGNOSIS — I440 Atrioventricular block, first degree: Secondary | ICD-10-CM | POA: Diagnosis not present

## 2024-02-05 ENCOUNTER — Other Ambulatory Visit: Payer: Self-pay | Admitting: Internal Medicine

## 2024-02-05 DIAGNOSIS — I4891 Unspecified atrial fibrillation: Secondary | ICD-10-CM

## 2024-02-05 DIAGNOSIS — E291 Testicular hypofunction: Secondary | ICD-10-CM | POA: Diagnosis not present

## 2024-02-06 NOTE — Telephone Encounter (Signed)
 Prescription refill request for Eliquis  received. Indication: A. FIB Last office visit: 02/21/23 Scr: 1.05 (EPIC 03/11/23) Age: 78 Weight: 151.5 kg  Patient is due for appointment this month and lab work in December. Called patient and made him an appointment with Dr. Santo in January. Patient had recent lab work, and will try to have his PCP fax those to us .

## 2024-03-01 ENCOUNTER — Other Ambulatory Visit: Payer: Self-pay | Admitting: Internal Medicine

## 2024-03-01 ENCOUNTER — Ambulatory Visit (HOSPITAL_COMMUNITY)
Admission: RE | Admit: 2024-03-01 | Discharge: 2024-03-01 | Disposition: A | Source: Ambulatory Visit | Attending: Internal Medicine

## 2024-03-01 DIAGNOSIS — I7121 Aneurysm of the ascending aorta, without rupture: Secondary | ICD-10-CM | POA: Diagnosis not present

## 2024-03-01 DIAGNOSIS — Z794 Long term (current) use of insulin: Secondary | ICD-10-CM | POA: Insufficient documentation

## 2024-03-01 DIAGNOSIS — E1159 Type 2 diabetes mellitus with other circulatory complications: Secondary | ICD-10-CM | POA: Diagnosis not present

## 2024-03-01 DIAGNOSIS — I2510 Atherosclerotic heart disease of native coronary artery without angina pectoris: Secondary | ICD-10-CM | POA: Diagnosis not present

## 2024-03-01 LAB — POCT I-STAT CREATININE: Creatinine, Ser: 1 mg/dL (ref 0.61–1.24)

## 2024-03-01 MED ORDER — IOHEXOL 350 MG/ML SOLN
75.0000 mL | Freq: Once | INTRAVENOUS | Status: AC | PRN
  Administered 2024-03-01: 75 mL via INTRAVENOUS

## 2024-03-05 ENCOUNTER — Other Ambulatory Visit: Payer: Self-pay | Admitting: Internal Medicine

## 2024-03-08 ENCOUNTER — Other Ambulatory Visit: Payer: Self-pay | Admitting: Internal Medicine

## 2024-03-09 ENCOUNTER — Ambulatory Visit: Payer: Self-pay | Admitting: Internal Medicine

## 2024-03-15 ENCOUNTER — Encounter: Payer: Self-pay | Admitting: Podiatry

## 2024-03-15 ENCOUNTER — Ambulatory Visit: Admitting: Podiatry

## 2024-03-15 DIAGNOSIS — B351 Tinea unguium: Secondary | ICD-10-CM

## 2024-03-15 DIAGNOSIS — M79674 Pain in right toe(s): Secondary | ICD-10-CM

## 2024-03-15 DIAGNOSIS — M79675 Pain in left toe(s): Secondary | ICD-10-CM

## 2024-03-15 DIAGNOSIS — E119 Type 2 diabetes mellitus without complications: Secondary | ICD-10-CM | POA: Diagnosis not present

## 2024-03-15 NOTE — Progress Notes (Signed)
 This patient returns to my office for at risk foot care.  This patient requires this care by a professional since this patient will be at risk due to having  diabetes.This patient is unable to cut nails themselves since the patient cannot reach their nails.These nails are painful walking and wearing shoes.  This patient presents for at risk foot care today.  General Appearance  Alert, conversant and in no acute stress.  Vascular  Dorsalis pedis  are palpable  bilaterally. Posterior tibial pulses are not palpable. Capillary return is within normal limits  bilaterally. Temperature is within normal limits  bilaterally.  Neurologic  Senn-Weinstein monofilament wire test within normal limits  bilaterally. Muscle power within normal limits bilaterally.  Nails Thick disfigured discolored nails with subungual debris  from hallux to fifth toes bilaterally. No evidence of bacterial infection or drainage bilaterally.  Orthopedic  No limitations of motion  feet .  No crepitus or effusions noted.  No bony pathology or digital deformities noted.  Mild  HAV  B/L.  Skin  normotropic skin with no porokeratosis noted bilaterally.  No signs of infections or ulcers noted.     Onychomycosis  Pain in right toe  Pain in left toe.  Consent was obtained for treatment procedures.  Debridement and grinding of long thick nails with clearing of subungual debris.  No infection or ulcer.     Return office visit  3 months        Told patient to return for periodic foot care and evaluation due to potential at risk complications.   Cordella Bold DPM tomma

## 2024-03-29 ENCOUNTER — Telehealth: Payer: Self-pay

## 2024-03-29 NOTE — Telephone Encounter (Signed)
 Called pt advised MD will not be in the office on 04/19/24.  Asked pt to call in to reschedule or send a message via my chart.

## 2024-03-31 NOTE — Telephone Encounter (Signed)
 Called pt scheduled OV with Dr. Santo 04/12/24 at 1:20 pm.

## 2024-04-12 ENCOUNTER — Ambulatory Visit: Attending: Internal Medicine | Admitting: Internal Medicine

## 2024-04-12 VITALS — BP 110/50 | HR 64 | Ht 71.0 in | Wt 291.0 lb

## 2024-04-12 DIAGNOSIS — I272 Pulmonary hypertension, unspecified: Secondary | ICD-10-CM | POA: Diagnosis not present

## 2024-04-12 DIAGNOSIS — I5030 Unspecified diastolic (congestive) heart failure: Secondary | ICD-10-CM | POA: Diagnosis not present

## 2024-04-12 DIAGNOSIS — I7121 Aneurysm of the ascending aorta, without rupture: Secondary | ICD-10-CM | POA: Insufficient documentation

## 2024-04-12 DIAGNOSIS — I4891 Unspecified atrial fibrillation: Secondary | ICD-10-CM | POA: Diagnosis not present

## 2024-04-12 NOTE — Patient Instructions (Signed)
 Medication Instructions:  Your physician recommends that you continue on your current medications as directed. Please refer to the Current Medication list given to you today.   *If you need a refill on your cardiac medications before your next appointment, please call your pharmacy*  Lab Work: NONE   Testing/Procedures: JUNE 2026- - - Your physician has requested that you have a CT Aorta.   Follow-Up: At [facility], you and your health needs are our priority.  As part of our continuing mission to provide you with exceptional heart care, our providers are all part of one team.  This team includes your primary Cardiologist (physician) and Advanced Practice Providers or APPs (Physician Assistants and Nurse Practitioners) who all work together to provide you with the care you need, when you need it.  Your next appointment:   1 year(s)  Provider:   Stanly DELENA Leavens, MD      Other Instructions

## 2024-04-12 NOTE — Progress Notes (Signed)
 " Cardiology Office Note:    Date:  04/12/2024   ID:  Benjamin Shepard, DOB 08/05/1945, MRN [id number]  PCP:  Larnell Hamilton, MD  Cardiologist:  Stanly DELENA Leavens, MD   Referring MD: Larnell Hamilton, MD   CC: AF mgmt  History of Present Illness:    Benjamin Shepard is a 79 y.o. male with a hx of dyspnea on exertion, type 2 diabetes mellitus, hypertension, hyperlipidemia, morbid obesity, and obstructive sleep apnea.  New atrial fibrillation with controlled ventricular  response and subsequent monitor demonstrated AF burden of 12%. 2023: Asymptomatic with Dr. Claudene    Benjamin Shepard, with a thoracic aortic aneurysm who presents for follow-up.  He has a moderate thoracic aortic aneurysm, last evaluated with a CT scan on March 03, 2023. Imaging has not been performed as frequently as recommended, with another study in 2025, though details are unclear. He is scheduled for a follow-up CT in about five months.  He has a history of atrial fibrillation with a controlled ventricular response and an afib burden of twelve percent. He takes Eliquis  5 mg twice daily and diltiazem  360 mg daily. No significant episodes of atrial fibrillation have occurred recently. No chest pain.  He takes hydrochlorothiazide  25 mg daily and Lasix  20 mg as needed, which he has been taking daily. He notes a decrease in lower extremity swelling, particularly when not on his feet for extended periods, and reports breathing well.  He has hyperlipidemia, managed with atorvastatin  20 mg daily. He also has type two diabetes, with a recent A1c reported as 'perfect' during a visit with his primary care provider.  He has a history of morbid obesity and is currently on Mounjaro, which he believes has contributed to weight loss and reduced swelling in his legs. No significant swelling in his legs or abdomen recently.  He has obstructive sleep apnea, but there is no discussion of current symptoms or treatment in this  visit.  Discussed the use of AI scribe software for clinical note transcription with the patient, who gave verbal consent to proceed.  Past Medical History:  Diagnosis Date   Anxiety    Back pain    BPH (benign prostatic hyperplasia)    Diabetes (HCC)    Edema    Edema of both lower extremities    Foley catheter in place    Food allergy    Gout    HTN (hypertension)    Hyperlipidemia    Joint pain    OSA (obstructive sleep apnea)    USES C-PAP   Psoriasis    Shortness of breath dyspnea    WITH EXERTION   Urinary retention     Past Surgical History:  Procedure Laterality Date   BACK SURGERY     AGE 7   CARDIAC CATHETERIZATION N/A 04/14/2015   Procedure: Left Heart Cath and Coronary Angiography;  Surgeon: Victory LELON Claudene, MD;  Location: [facility] INVASIVE CV LAB;  Service: Cardiovascular;  Laterality: N/A;   ROBOT ASSISTED INGUINAL HERNIA REPAIR Bilateral 05/31/2015   Procedure: ROBOT ASSISTED INGUINAL HERNIA REPAIR;  Surgeon: Ricardo Likens, MD;  Location: [facility];  Service: Urology;  Laterality: Bilateral;   XI ROBOTIC ASSISTED SIMPLE PROSTATECTOMY N/A 05/31/2015   Procedure: XI ROBOTIC ASSISTED SIMPLE PROSTATECTOMY;  Surgeon: Ricardo Likens, MD;  Location: [facility];  Service: Urology;  Laterality: N/A;    Current Medications: Current Meds  Medication Sig   allopurinol  (ZYLOPRIM ) 100 MG tablet Take 100 mg by mouth daily.   apixaban  (ELIQUIS ) 5  MG TABS tablet TAKE 1 TABLET(5 MG) BY MOUTH TWICE DAILY   atorvastatin  (LIPITOR) 20 MG tablet Take 20 mg by mouth daily.   colchicine  0.6 MG tablet Take 0.6 mg by mouth daily as needed (gout).    diltiazem  (TIAZAC ) 360 MG 24 hr capsule TAKE 1 CAPSULE(360 MG) BY MOUTH DAILY   doxazosin  (CARDURA ) 8 MG tablet TAKE 2 TABLETS(16 MG) BY MOUTH DAILY   escitalopram (LEXAPRO) 10 MG tablet Take 10 mg by mouth daily.   furosemide  (LASIX ) 20 MG tablet Take 20 mg by mouth daily as needed (leg swelling).   hydrochlorothiazide  (HYDRODIURIL ) 25 MG tablet TAKE 1  TABLET(25 MG) BY MOUTH DAILY   Lancets (ONETOUCH ULTRASOFT) lancets Test BS twice a day.  ICS9-250.00   losartan  (COZAAR ) 100 MG tablet TAKE 1 TABLET(100 MG) BY MOUTH DAILY   metFORMIN  (GLUCOPHAGE ) 500 MG tablet Take 500 mg by mouth daily with breakfast.    MOUNJARO 5 MG/0.5ML Pen once a week.   oxyCODONE -acetaminophen  (PERCOCET/ROXICET) 5-325 MG tablet Take 1-2 tablets by mouth every 4 to 6 hours as needed for pain.   potassium chloride  SA (KLOR-CON  M20) 20 MEQ tablet Take 1 tablet (20 mEq total) by mouth daily.   testosterone  cypionate (DEPOTESTOSTERONE CYPIONATE) 200 MG/ML injection every 21 ( twenty-one) days.   Tiotropium Bromide-Olodaterol (STIOLTO RESPIMAT ) 2.5-2.5 MCG/ACT AERS Inhale 2 puffs into the lungs daily.   Vitamin D , Ergocalciferol , (DRISDOL ) 1.25 MG (50000 UNIT) CAPS capsule Take 50,000 Units by mouth every 7 (seven) days.     Allergies:   Levaquin [levofloxacin], Shrimp [shellfish allergy], and Other   Social History   Socioeconomic History   Marital status: Widowed    Spouse name: Not on file   Number of children: Not on file   Years of education: Not on file   Highest education level: Not on file  Occupational History   Not on file  Tobacco Use   Smoking status: Former    Current packs/day: 0.00    Types: Cigarettes    Quit date: 03/15/1998    Years since quitting: 26.0   Smokeless tobacco: Never  Substance and Sexual Activity   Alcohol  use: Yes    Comment: rare wine, beer   Drug use: No   Sexual activity: Not on file  Other Topics Concern   Not on file  Social History Narrative   Not on file   Social Drivers of Health   Tobacco Use: Medium Risk (03/15/2024)   Patient History    Smoking Tobacco Use: Former    Smokeless Tobacco Use: Never    Passive Exposure: Not on Actuary Strain: Not on file  Food Insecurity: Not on file  Transportation Needs: Not on file  Physical Activity: Not on file  Stress: Not on file  Social  Connections: Not on file  Depression (PHQ2-9): Not on file  Alcohol  Screen: Not on file  Housing: Not on file  Utilities: Not on file  Health Literacy: Not on file     Family History: The patient's family history includes Anxiety disorder in his mother; Depression in his mother; Heart Problems in his mother; Heart disease in his brother, brother, and mother; Heart failure in his mother; High blood pressure in his mother; Hypertension in his father. There is no history of Colon cancer.  ROS:   Please see the history of present illness.      EKGs/Labs/Other Studies Reviewed:    Cardiac Studies & Procedures   ______________________________________________________________________________________________ CARDIAC CATHETERIZATION  CARDIAC CATHETERIZATION 04/14/2015  Conclusion   False positive myocardial perfusion study.   Normal coronary arteries , with right dominant circulation.   Normal left ventricular systolic function. Mildly elevated end-diastolic pressure.  RECOMMENDATIONS:    The patient is now cleared to proceed with the upcoming prostate surgical procedure by Dr. Alvaro.   No significant cardiovascular abnormalities have been identified.  Findings Coronary Findings Diagnostic  Dominance: Right  No diagnostic findings have been documented. Intervention  No interventions have been documented.   STRESS TESTS  MYOCARDIAL PERFUSION IMAGING 04/07/2015  Interpretation Summary  Nuclear stress EF: 72%.  Findings consistent with ischemia.  This is a low risk study.  The left ventricular ejection fraction is hyperdynamic (>65%).  Small area of mild anteroapical ischemia.  SDS 7 Normal EF 72%   ECHOCARDIOGRAM  ECHOCARDIOGRAM COMPLETE 05/30/2021  Narrative ECHOCARDIOGRAM REPORT    Patient Name:   Benjamin Shepard Date of Exam: 05/30/2021 Medical Rec #:  [id number]          Height:       70.0 in Accession #:    [id number]         Weight:       333.8 lb Date  of Birth:  Sep 03, 1945           BSA:          2.595 m Patient Age:    75 years           BP:           110/60 mmHg Patient Gender: M                  HR:           81 bpm. Exam Location:  [address]  Procedure: 2D Echo, Cardiac Doppler, Color Doppler and Intracardiac Opacification Agent  Indications:    I48.19 Atrial fibrillation  History:        Patient has no prior history of Echocardiogram examinations. Signs/Symptoms:Edema and Shortness of Breath; Risk Factors:Hypertension, Sleep Apnea, Diabetes, Dyslipidemia and Former Smoker. Anemia.  Sonographer:    Marshia Lawyer BS, RDCS Referring Phys: [phone] [facility]   Sonographer Comments: Technically difficult study due to poor echo windows, suboptimal parasternal window and suboptimal apical window. Image acquisition challenging due to patient body habitus. IMPRESSIONS   1. Left ventricular ejection fraction, by estimation, is 60 to 65%. The left ventricle has normal function. The left ventricle has no regional wall motion abnormalities. There is mild concentric left ventricular hypertrophy. Left ventricular diastolic parameters were normal. 2. Right ventricular systolic function is normal. The right ventricular size is mildly enlarged. Tricuspid regurgitation signal is inadequate for assessing PA pressure. 3. The mitral valve is normal in structure. No evidence of mitral valve regurgitation. No evidence of mitral stenosis. 4. The aortic valve was not well visualized. Aortic valve regurgitation is not visualized. No aortic stenosis is present. Aortic valve area, by VTI measures 4.41 cm. Aortic valve mean gradient measures 8.0 mmHg. Aortic valve Vmax measures 1.90 m/s. 5. Aortic dilatation noted. 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 6. The inferior vena cava is normal in size with greater than 50% respiratory variability, suggesting right atrial pressure of 3  mmHg.  FINDINGS Left Ventricle: Left ventricular ejection fraction, by estimation, is 60 to 65%. The left ventricle has normal function. The left ventricle has no regional wall motion abnormalities. Definity  contrast agent was given IV to delineate  the left ventricular endocardial borders. The left ventricular internal cavity size was normal in size. There is mild concentric left ventricular hypertrophy. Left ventricular diastolic parameters were normal. Normal left ventricular filling pressure.  Right Ventricle: The right ventricular size is mildly enlarged. No increase in right ventricular wall thickness. Right ventricular systolic function is normal. Tricuspid regurgitation signal is inadequate for assessing PA pressure.  Left Atrium: Left atrial size was normal in size.  Right Atrium: Right atrial size was normal in size.  Pericardium: There is no evidence of pericardial effusion. Presence of epicardial fat layer.  Mitral Valve: The mitral valve is normal in structure. Mild mitral annular calcification. No evidence of mitral valve regurgitation. No evidence of mitral valve stenosis.  Tricuspid Valve: The tricuspid valve is normal in structure. Tricuspid valve regurgitation is not demonstrated. No evidence of tricuspid stenosis.  Aortic Valve: The aortic valve was not well visualized. Aortic valve regurgitation is not visualized. No aortic stenosis is present. Aortic valve mean gradient measures 8.0 mmHg. Aortic valve peak gradient measures 14.5 mmHg. Aortic valve area, by VTI measures 4.41 cm.  Pulmonic Valve: The pulmonic valve was normal in structure. Pulmonic valve regurgitation is not visualized. No evidence of pulmonic stenosis.  Aorta: Aortic dilatation noted. There is an aneurysm involving the aortic arch measuring 47 mm. There is an aneurysm involving the ascending aorta measuring 48 mm.  Venous: The inferior vena cava is normal in size with greater than 50% respiratory  variability, suggesting right atrial pressure of 3 mmHg.  IAS/Shunts: No atrial level shunt detected by color flow Doppler.   LEFT VENTRICLE PLAX 2D LVIDd:         5.20 cm   Diastology LVIDs:         3.50 cm   LV e' medial:    8.59 cm/s LV PW:         1.30 cm   LV E/e' medial:  13.3 LV IVS:        1.30 cm   LV e' lateral:   8.49 cm/s LVOT diam:     2.50 cm   LV E/e' lateral: 13.4 LV SV:         186 LV SV Index:   72 LVOT Area:     4.91 cm   RIGHT VENTRICLE             IVC RV Basal diam:  4.20 cm     IVC diam: 1.10 cm RV S prime:     17.95 cm/s TAPSE (M-mode): 3.0 cm  LEFT ATRIUM             Index        RIGHT ATRIUM           Index LA diam:        3.50 cm 1.35 cm/m   RA Pressure: 3.00 mmHg LA Vol (A2C):   77.1 ml 29.71 ml/m  RA Area:     15.50 cm LA Vol (A4C):   59.0 ml 22.74 ml/m  RA Volume:   42.90 ml  16.53 ml/m LA Biplane Vol: 70.0 ml 26.98 ml/m AORTIC VALVE AV Area (Vmax):    4.30 cm AV Area (Vmean):   4.28 cm AV Area (VTI):     4.41 cm AV Vmax:           190.50 cm/s AV Vmean:          125.000 cm/s AV VTI:  0.422 m AV Peak Grad:      14.5 mmHg AV Mean Grad:      8.0 mmHg LVOT Vmax:         167.00 cm/s LVOT Vmean:        109.000 cm/s LVOT VTI:          0.379 m LVOT/AV VTI ratio: 0.90  AORTA Ao Root diam: 4.70 cm Ao Asc diam:  4.80 cm  MITRAL VALVE                TRICUSPID VALVE Estimated RAP:  3.00 mmHg MV Decel Time: 320 msec MV E velocity: 114.00 cm/s  SHUNTS MV A velocity: 130.00 cm/s  Systemic VTI:  0.38 m MV E/A ratio:  0.88         Systemic Diam: 2.50 cm  Wilbert Bihari MD Electronically signed by Wilbert Bihari MD Signature Date/Time: 05/30/2021/11:33:46 AM    Final    MONITORS  LONG TERM MONITOR (3-14 DAYS) 03/21/2023  Narrative   Patient had a minimum heart rate of 34 bpm, maximum heart rate of 105 bpm, and average heart rate of 53 bpm. Predominant underlying rhythm was sinus rhythm. Rare paroxsymal SVT. Isolated PACs  were rare (<1.0%). Isolated PVCs were rare (<1.0%). Triggered and diary events associated with sinus rhythm.  No malignant arrhythmias.       ______________________________________________________________________________________________      Recent Labs: 01/20/2024: TSH 2.26 03/01/2024: Creatinine, Ser 1.00  Recent Lipid Panel    Component Value Date/Time   CHOL 109 01/13/2023 0838   TRIG 58 01/13/2023 0838   HDL 41 01/13/2023 0838   CHOLHDL 2.7 01/13/2023 0838   LDLCALC 55 01/13/2023 0838    Physical Exam:    VS:  BP (!) 110/50 (BP Location: Right Arm)   Pulse 64   Ht 5' 11 (1.803 m)   Wt 291 lb (132 kg)   SpO2 96%   BMI 40.59 kg/m     Wt Readings from Last 3 Encounters:  04/12/24 291 lb (132 kg)  07/09/23 (!) 334 lb (151.5 kg)  02/21/23 (!) 338 lb (153.3 kg)    GEN: Morbidly obese NECK: No JVD. CARDIAC: RRR No murmur; no LE edema VASCULAR:  Normal Pulses.  RESPIRATORY:  Clear to auscultation without rales, wheezing or rhonchi  ABDOMEN: Soft, non-tender, non-distended, SKIN: Warm and dry NEUROLOGIC:  Alert and oriented x 3 PSYCHIATRIC:  Normal affect   ASSESSMENT/PLAN:    Moderate thoracic aortic aneurysm Maximal aortic diameter of 46 mm. No significant aortic stenosis or regurgitation. No family history of sudden cardiac death or aortic dissection. Surgical intervention is not indicated at this time due to age and aneurysm size. Monitoring is preferred until the aneurysm reaches 50 mm or greater, at which point surgical consultation may be considered (suspect 55 mm would be his indication for surgery) - Will order CT of the aorta in five months - Will alternate between CT and echocardiogram for monitoring if stable - Will refer to cardiothoracic surgery if aneurysm reaches 50 mm or greater - discussed meds to avoid  Paroxysmal atrial fibrillation Currently in sinus rhythm with first-degree heart block. No significant symptomatic atrial fibrillation  reported. Blood thinners are continued to prevent stroke risk. - Continue Eliquis  5 mg PO BID - Continue diltiazem  360 mg PO daily - Requested labs from [facility]  Heart failure with preserved ejection fraction Managed with Lasix  and hydrochlorothiazide . Reports decreased lower extremity swelling and improved symptoms with current regimen. - Continue Lasix  20  mg as needed - Continue hydrochlorothiazide  25 mg PO daily  Pulmonary hypertension Improvement noted. No current need for invasive procedures. - Continue current management  Non-obstructive coronary artery disease No changes in management. Previous heart catheterization showed mild findings. - Continue current management - Requested labs from [facility]  One year with me unless issues.  Longitudinal care: The evaluation and management services provided today reflect the complexity inherent in caring for this patient, including the ongoing longitudinal relationship and management of multiple chronic conditions and/or the need for care coordination. The visit required a comprehensive assessment and management plan tailored to the patient's unique needs Time was spent addressing not only the acute concerns but also the broader context of the patient's health, including preventive care, chronic disease management, and care coordination as appropriate.  Complex longitudinal is necessary for conditions including: thoracic aortopathy management    Stanly Leavens, MD FASE [facility] Cardiologist [facility]  [address] [place], [place] [address] [phone]  2:01 PM  "

## 2024-04-14 ENCOUNTER — Other Ambulatory Visit: Payer: Self-pay | Admitting: Internal Medicine

## 2024-04-19 ENCOUNTER — Ambulatory Visit: Admitting: Internal Medicine

## 2024-05-04 ENCOUNTER — Other Ambulatory Visit: Payer: Self-pay | Admitting: Internal Medicine

## 2024-05-04 DIAGNOSIS — I4891 Unspecified atrial fibrillation: Secondary | ICD-10-CM

## 2024-05-05 NOTE — Telephone Encounter (Signed)
 Eliquis  5mg  refill request received. Patient is 79 years old, weight-132kg, Crea-1.0 on 01/20/24 via [facility] from [facility], Diagnosis-Afib, and last seen by Dr. Santo on 04/12/24. Dose is appropriate based on dosing criteria. Will send in refill to requested pharmacy.

## 2024-06-14 ENCOUNTER — Ambulatory Visit: Admitting: Podiatry

## 2024-09-15 ENCOUNTER — Other Ambulatory Visit (HOSPITAL_COMMUNITY)
